# Patient Record
Sex: Male | Born: 1960 | Race: Black or African American | Hispanic: No | Marital: Married | Smoking: Former smoker
Health system: Southern US, Community
[De-identification: ages and names within clinical notes are randomized; demographics above are authoritative.]

## PROBLEM LIST (undated history)

## (undated) DIAGNOSIS — G4700 Insomnia, unspecified: Secondary | ICD-10-CM

## (undated) DIAGNOSIS — E039 Hypothyroidism, unspecified: Secondary | ICD-10-CM

## (undated) DIAGNOSIS — J439 Emphysema, unspecified: Secondary | ICD-10-CM

## (undated) DIAGNOSIS — J449 Chronic obstructive pulmonary disease, unspecified: Secondary | ICD-10-CM

## (undated) DIAGNOSIS — Z720 Tobacco use: Secondary | ICD-10-CM

## (undated) DIAGNOSIS — R51 Headache: Secondary | ICD-10-CM

## (undated) DIAGNOSIS — R0602 Shortness of breath: Secondary | ICD-10-CM

## (undated) DIAGNOSIS — J90 Pleural effusion, not elsewhere classified: Secondary | ICD-10-CM

## (undated) DIAGNOSIS — I10 Essential (primary) hypertension: Secondary | ICD-10-CM

## (undated) DIAGNOSIS — R351 Nocturia: Secondary | ICD-10-CM

## (undated) DIAGNOSIS — C3490 Malignant neoplasm of unspecified part of unspecified bronchus or lung: Secondary | ICD-10-CM

## (undated) DIAGNOSIS — K219 Gastro-esophageal reflux disease without esophagitis: Secondary | ICD-10-CM

## (undated) HISTORY — DX: Malignant neoplasm of unspecified part of unspecified bronchus or lung: C34.90

## (undated) HISTORY — PX: APPENDECTOMY: SHX54

## (undated) HISTORY — DX: Pleural effusion, not elsewhere classified: J90

## (undated) HISTORY — DX: Chronic obstructive pulmonary disease, unspecified: J44.9

---

## 2007-02-11 ENCOUNTER — Emergency Department (HOSPITAL_COMMUNITY): Admission: EM | Admit: 2007-02-11 | Discharge: 2007-02-11 | Payer: Self-pay | Admitting: Emergency Medicine

## 2011-05-27 LAB — URINALYSIS, ROUTINE W REFLEX MICROSCOPIC
Bilirubin Urine: NEGATIVE
Glucose, UA: NEGATIVE
Specific Gravity, Urine: 1.021
pH: 6

## 2011-05-27 LAB — CBC
HCT: 46.1
Hemoglobin: 15.6
MCV: 87.5
RBC: 5.27
WBC: 8

## 2011-05-27 LAB — LIPASE, BLOOD: Lipase: 14

## 2011-05-27 LAB — COMPREHENSIVE METABOLIC PANEL
Albumin: 3.6
Alkaline Phosphatase: 83
BUN: 8
CO2: 28
Chloride: 107
Creatinine, Ser: 0.77
GFR calc non Af Amer: >60
Potassium: 3.6
Total Bilirubin: 0.9

## 2011-05-27 LAB — DIFFERENTIAL
Basophils Absolute: 0
Basophils Relative: 0
Eosinophils Relative: 1
Lymphocytes Relative: 34
Monocytes Absolute: 0.5
Neutro Abs: 4.7

## 2011-05-27 LAB — URINE MICROSCOPIC-ADD ON: RBC / HPF: NONE SEEN

## 2012-12-24 ENCOUNTER — Inpatient Hospital Stay (HOSPITAL_COMMUNITY): Payer: Medicaid Other

## 2012-12-24 ENCOUNTER — Inpatient Hospital Stay (HOSPITAL_COMMUNITY)
Admission: EM | Admit: 2012-12-24 | Discharge: 2012-12-30 | DRG: 180 | Disposition: A | Discharge status: Home / Self Care | Payer: Medicaid Other | Attending: Internal Medicine | Admitting: Internal Medicine

## 2012-12-24 ENCOUNTER — Emergency Department (HOSPITAL_COMMUNITY): Payer: Medicaid Other

## 2012-12-24 ENCOUNTER — Encounter (HOSPITAL_COMMUNITY): Payer: Self-pay | Admitting: *Deleted

## 2012-12-24 DIAGNOSIS — R000 Tachycardia, unspecified: Secondary | ICD-10-CM | POA: Diagnosis present

## 2012-12-24 DIAGNOSIS — R0902 Hypoxemia: Secondary | ICD-10-CM

## 2012-12-24 DIAGNOSIS — E43 Unspecified severe protein-calorie malnutrition: Secondary | ICD-10-CM | POA: Diagnosis present

## 2012-12-24 DIAGNOSIS — J4489 Other specified chronic obstructive pulmonary disease: Secondary | ICD-10-CM | POA: Diagnosis present

## 2012-12-24 DIAGNOSIS — I10 Essential (primary) hypertension: Secondary | ICD-10-CM | POA: Diagnosis present

## 2012-12-24 DIAGNOSIS — E41 Nutritional marasmus: Secondary | ICD-10-CM | POA: Diagnosis present

## 2012-12-24 DIAGNOSIS — J90 Pleural effusion, not elsewhere classified: Secondary | ICD-10-CM | POA: Diagnosis present

## 2012-12-24 DIAGNOSIS — J449 Chronic obstructive pulmonary disease, unspecified: Secondary | ICD-10-CM | POA: Diagnosis present

## 2012-12-24 DIAGNOSIS — F17200 Nicotine dependence, unspecified, uncomplicated: Secondary | ICD-10-CM | POA: Diagnosis present

## 2012-12-24 DIAGNOSIS — IMO0002 Reserved for concepts with insufficient information to code with codable children: Secondary | ICD-10-CM

## 2012-12-24 DIAGNOSIS — R599 Enlarged lymph nodes, unspecified: Secondary | ICD-10-CM | POA: Diagnosis present

## 2012-12-24 DIAGNOSIS — Z720 Tobacco use: Secondary | ICD-10-CM | POA: Diagnosis present

## 2012-12-24 DIAGNOSIS — Z6826 Body mass index (BMI) 26.0-26.9, adult: Secondary | ICD-10-CM

## 2012-12-24 DIAGNOSIS — R634 Abnormal weight loss: Secondary | ICD-10-CM | POA: Diagnosis present

## 2012-12-24 DIAGNOSIS — J910 Malignant pleural effusion: Principal | ICD-10-CM | POA: Diagnosis present

## 2012-12-24 DIAGNOSIS — Z8042 Family history of malignant neoplasm of prostate: Secondary | ICD-10-CM

## 2012-12-24 HISTORY — DX: Tobacco use: Z72.0

## 2012-12-24 HISTORY — DX: Essential (primary) hypertension: I10

## 2012-12-24 LAB — CBC WITH DIFFERENTIAL/PLATELET
Basophils Absolute: 0 10*3/uL (ref 0.0–0.1)
Hemoglobin: 15.2 g/dL (ref 13.0–17.0)
MCH: 29 pg (ref 26.0–34.0)
MCHC: 34.2 g/dL (ref 30.0–36.0)
Neutro Abs: 4.4 10*3/uL (ref 1.7–7.7)
Neutrophils Relative %: 56 % (ref 43–77)
Platelets: 226 10*3/uL (ref 150–400)
RBC: 5.25 MIL/uL (ref 4.22–5.81)

## 2012-12-24 LAB — COMPREHENSIVE METABOLIC PANEL
BUN: 13 mg/dL (ref 6–23)
CO2: 24 mEq/L (ref 19–32)
Calcium: 9.1 mg/dL (ref 8.4–10.5)
Creatinine, Ser: 0.72 mg/dL (ref 0.50–1.35)
GFR calc Af Amer: >90 mL/min (ref >90)
GFR calc non Af Amer: >90 mL/min (ref >90)
Glucose, Bld: 96 mg/dL (ref 70–99)

## 2012-12-24 LAB — URINALYSIS, ROUTINE W REFLEX MICROSCOPIC
Leukocytes, UA: NEGATIVE
Protein, ur: NEGATIVE mg/dL
Urobilinogen, UA: 0.2 mg/dL (ref 0.0–1.0)

## 2012-12-24 LAB — URINE MICROSCOPIC-ADD ON

## 2012-12-24 LAB — PRO B NATRIURETIC PEPTIDE: Pro B Natriuretic peptide (BNP): 59.5 pg/mL (ref 0–125)

## 2012-12-24 MED ORDER — ONDANSETRON HCL 4 MG/2ML IJ SOLN
4.0000 mg | Freq: Once | INTRAMUSCULAR | Status: AC
  Administered 2012-12-24: 4 mg via INTRAVENOUS
  Filled 2012-12-24: qty 2

## 2012-12-24 MED ORDER — SODIUM CHLORIDE 0.9 % IV SOLN
INTRAVENOUS | Status: AC
  Administered 2012-12-24: 19:00:00 via INTRAVENOUS

## 2012-12-24 MED ORDER — ONDANSETRON HCL 4 MG PO TABS
4.0000 mg | ORAL_TABLET | Freq: Four times a day (QID) | ORAL | Status: DC | PRN
  Administered 2012-12-25: 4 mg via ORAL
  Filled 2012-12-24: qty 1

## 2012-12-24 MED ORDER — IOHEXOL 350 MG/ML SOLN
100.0000 mL | Freq: Once | INTRAVENOUS | Status: AC | PRN
  Administered 2012-12-24: 100 mL via INTRAVENOUS

## 2012-12-24 MED ORDER — NICOTINE 7 MG/24HR TD PT24
7.0000 mg | MEDICATED_PATCH | Freq: Every day | TRANSDERMAL | Status: DC
  Administered 2012-12-24 – 2012-12-25 (×2): 7 mg via TRANSDERMAL
  Filled 2012-12-24 (×2): qty 1

## 2012-12-24 MED ORDER — ACETAMINOPHEN 650 MG RE SUPP: 650.0000 mg | Freq: Four times a day (QID) | RECTAL | Status: DC | PRN

## 2012-12-24 MED ORDER — SODIUM CHLORIDE 0.9 % IJ SOLN
3.0000 mL | Freq: Two times a day (BID) | INTRAMUSCULAR | Status: DC
  Administered 2012-12-24 – 2012-12-26 (×5): 3 mL via INTRAVENOUS
  Administered 2012-12-26: 23:00:00 via INTRAVENOUS
  Administered 2012-12-27 – 2012-12-30 (×5): 3 mL via INTRAVENOUS

## 2012-12-24 MED ORDER — HYDROCODONE-ACETAMINOPHEN 5-325 MG PO TABS
1.0000 | ORAL_TABLET | ORAL | Status: DC | PRN
  Administered 2012-12-25 (×2): 1 via ORAL
  Administered 2012-12-27 – 2012-12-29 (×3): 2 via ORAL
  Filled 2012-12-24 (×2): qty 2
  Filled 2012-12-24 (×2): qty 1
  Filled 2012-12-24: qty 2

## 2012-12-24 MED ORDER — DEXTROSE 5 % IV SOLN
1.0000 g | Freq: Once | INTRAVENOUS | Status: AC
  Administered 2012-12-24: 1 g via INTRAVENOUS
  Filled 2012-12-24: qty 10

## 2012-12-24 MED ORDER — ACETAMINOPHEN 325 MG PO TABS
650.0000 mg | ORAL_TABLET | Freq: Four times a day (QID) | ORAL | Status: DC | PRN
  Administered 2012-12-25 – 2012-12-26 (×4): 650 mg via ORAL
  Filled 2012-12-24 (×4): qty 2

## 2012-12-24 MED ORDER — HYDROMORPHONE HCL PF 1 MG/ML IJ SOLN: 1.0000 mg | INTRAMUSCULAR | Status: DC | PRN

## 2012-12-24 MED ORDER — ONDANSETRON HCL 4 MG/2ML IJ SOLN
4.0000 mg | Freq: Four times a day (QID) | INTRAMUSCULAR | Status: DC | PRN
  Administered 2012-12-26 – 2012-12-28 (×3): 4 mg via INTRAVENOUS
  Filled 2012-12-24 (×3): qty 2

## 2012-12-24 MED ORDER — DEXTROSE 5 % IV SOLN
500.0000 mg | INTRAVENOUS | Status: DC
  Administered 2012-12-25 – 2012-12-29 (×4): 500 mg via INTRAVENOUS
  Filled 2012-12-24 (×6): qty 500

## 2012-12-24 MED ORDER — MORPHINE SULFATE 4 MG/ML IJ SOLN
4.0000 mg | Freq: Once | INTRAMUSCULAR | Status: AC
  Administered 2012-12-24: 4 mg via INTRAVENOUS
  Filled 2012-12-24: qty 1

## 2012-12-24 MED ORDER — SENNOSIDES-DOCUSATE SODIUM 8.6-50 MG PO TABS
1.0000 | ORAL_TABLET | Freq: Every evening | ORAL | Status: DC | PRN
  Filled 2012-12-24: qty 1

## 2012-12-24 MED ORDER — ONDANSETRON HCL 4 MG/2ML IJ SOLN: 4.0000 mg | Freq: Three times a day (TID) | INTRAMUSCULAR | Status: DC | PRN

## 2012-12-24 MED ORDER — DEXTROSE 5 % IV SOLN
500.0000 mg | Freq: Once | INTRAVENOUS | Status: AC
  Administered 2012-12-24: 500 mg via INTRAVENOUS
  Filled 2012-12-24: qty 500

## 2012-12-24 MED ORDER — ALBUTEROL SULFATE HFA 108 (90 BASE) MCG/ACT IN AERS
2.0000 | INHALATION_SPRAY | RESPIRATORY_TRACT | Status: DC | PRN
  Filled 2012-12-24: qty 6.7

## 2012-12-24 MED ORDER — DEXTROSE 5 % IV SOLN
1.0000 g | INTRAVENOUS | Status: DC
  Administered 2012-12-25 – 2012-12-29 (×5): 1 g via INTRAVENOUS
  Filled 2012-12-24 (×6): qty 10

## 2012-12-24 MED ORDER — TRAZODONE HCL 50 MG PO TABS
50.0000 mg | ORAL_TABLET | Freq: Every evening | ORAL | Status: DC | PRN
  Filled 2012-12-24: qty 1

## 2012-12-24 MED ORDER — GUAIFENESIN-DM 100-10 MG/5ML PO SYRP: 5.0000 mL | ORAL_SOLUTION | ORAL | Status: DC | PRN

## 2012-12-24 MED ORDER — MORPHINE SULFATE 2 MG/ML IJ SOLN: 2.0000 mg | INTRAMUSCULAR | Status: DC | PRN

## 2012-12-24 NOTE — ED Notes (Signed)
Admitting MD at bedside.

## 2012-12-24 NOTE — ED Notes (Signed)
Pt given a urinal and is aware a urine sample is needed 

## 2012-12-24 NOTE — H&P (Signed)
[facility] History and Physical  Deniel Mcquiston EAV:[id number] DOB: May 26, 1961 DOA: 12/24/2012  Referring physician: Patria Mane PCP: No PCP Per Patient   Chief Complaint: cough, shortness of breath  HPI: Geoffrey Richardson is a 52 y.o. male who presents to the emergency room with shortness of breath cough weakness and right-sided pleuritic chest pain. He started having a cough about 6 months ago. It's usually white but sometimes blood tinged. He has had subjective fevers and chills. He is extremely weak. He has become short of breath. He had been smoking a pack and a half of cigarettes per day but has cut down to just a few a day. He has lost a lot of weight recently. He never sought medical advice previously. In the emergency room, he was found to have a large pleural effusion on chest x-ray. White count was normal. He was started on antibiotics, oxygen and a CT angiogram was ordered. No known lung problems previously. His oxygen saturations reportedly were in the 80s but I do not see where this was documented. Currently in the 90s on supplemental oxygen. Apparently he was quite tachycardic as well into the 120s. CT scan shows large right pleural effusion without any definite PE or mass. Emphysema.   Review of Systems: As above otherwise negative  Past medical history: None  Past surgical history: Appendectomy  Social History: Smoker, usually 1-1/2 packs per day. Drinks occasionally. Denies drugs. Works in [employer].  No Known Allergies  Family history: Mother had ALS. Father and grandfather had prostate cancer.  Prior to Admission medications   Medication Sig Start Date End Date Taking? Authorizing Provider  ibuprofen (ADVIL,MOTRIN) 200 MG tablet Take 200 mg by mouth every 6 (six) hours as needed for pain.   Yes Historical Provider, MD   Physical Exam: Filed Vitals:   12/24/12 1620 12/24/12 1621 12/24/12 1900  BP: 142/94  147/101  Pulse: 101  86  Temp: 97.9 F (36.6 C)    TempSrc:  Oral    Resp: 13  18  Height:  6\' 2"  (1.88 m)   Weight:  95.255 kg (210 lb)   SpO2: 96%  93%   BP 151/111  Pulse 62  Temp(Src) 97.6 F (36.4 C) (Oral)  Resp 18  Ht 6\' 2"  (1.88 m)  Wt 95.255 kg (210 lb)  BMI 26.95 kg/m2  SpO2 95%  General Appearance:    Alert, cooperative, slightly anxious appearing. Appears younger than stated age   Head:    Normocephalic, without obvious abnormality, atraumatic  Eyes:    PERRL, conjunctiva/corneas clear, EOM's intact, fundi    benign, both eyes          Nose:   Nares normal, septum midline, mucosa normal, no drainage   or sinus tenderness  Throat:   Lips, mucosa, and tongue normal; teeth and gums normal  Neck:   Supple, symmetrical, trachea midline, no adenopathy;       thyroid:  No enlargement/tenderness/nodules; no carotid   bruit or JVD  Back:     Symmetric, no curvature, ROM normal, no CVA tenderness  Lungs:     bronchial breath sounds on the right. No rales. Dullness to percussion on the right. No wheezes.   Chest wall:    No tenderness or deformity  Heart:    Regular rate and rhythm, S1 and S2 normal, no murmur, rub   or gallop  Abdomen:     Soft, non-tender, bowel sounds active all four quadrants,    no masses, no  organomegaly  Genitalia:   deferred   Rectal:   deferred   Extremities:   Extremities normal, atraumatic, no cyanosis or edema  Pulses:   2+ and symmetric all extremities  Skin:   Skin color, texture, turgor normal, no rashes or lesions  Lymph nodes:   Cervical, supraclavicular, and axillary nodes normal  Neurologic:   CNII-XII intact. Normal strength, sensation and reflexes      throughout    Psychiatric: Anxious appearing  Labs on Admission:  Basic Metabolic Panel:  Recent Labs Lab 12/24/12 1632  NA 141  K 3.8  CL 107  CO2 24  GLUCOSE 96  BUN 13  CREATININE 0.72  CALCIUM 9.1   Liver Function Tests:  Recent Labs Lab 12/24/12 1632  AST 17  ALT 16  ALKPHOS 93  BILITOT 0.5  PROT 7.5  ALBUMIN 3.1*    No results found for this basename: LIPASE, AMYLASE,  in the last 168 hours No results found for this basename: AMMONIA,  in the last 168 hours CBC:  Recent Labs Lab 12/24/12 1632  WBC 7.9  NEUTROABS 4.4  HGB 15.2  HCT 44.4  MCV 84.6  PLT 226   Cardiac E Willnzymes:  Recent Labs Lab 12/24/12 1634  TROPONINI <0.30    BNP (last 3 results)  Recent Labs  12/24/12 1634  PROBNP 59.5   CBG: No results found for this basename: GLUCAP,  in the last 168 hours  Radiological Exams on Admission: Dg Chest 2 View  12/24/2012   *RADIOLOGY REPORT*  Clinical Data: Right-sided chest pain.  Short of breath.  CHEST - 2 VIEW  Comparison: None.  Findings: Heart size is probably normal.  Mediastinal shadows are normal.  The left lung is clear.  There is a large pleural effusion on the right apparently dependently positioned.  The right upper lobe shows partial aeration.  The right lower lobe and right middle lobe or collapse.  No significant bony finding.  IMPRESSION: Large right effusion with collapse of the right middle and lower lobe.  Etiology uncertain.  ,   Original Report Authenticated By: Paulina Fusi, M.D.    EKG: ST with nonspecific changes.  Assessment/Plan Principal Problem:   Pleural effusion, right: infection v. Neoplasm.  Will order US guided thoracentesis, and send for the usual studies plus cytology.  Cover with abx for CAP. Check HIV Active Problems:   Tobacco abuse with copd   Weight loss   Code Status: full Family Communication: daughter and friend Disposition Plan: home  Time spent: 60 minutes  Christiane Ha [facility] Pager [phone]  If 7PM-7AM, please contact night-coverage [email or web address] Password [facility] 12/24/2012, 7:54 PM

## 2012-12-24 NOTE — ED Notes (Signed)
Pt c/o right side CP that goes to his back and is worse with cough. Pt reports cough greater than 1 month. Has recently had sob and generalized weakness.

## 2012-12-24 NOTE — ED Provider Notes (Signed)
History     CSN: [id number]  Arrival date & time 12/24/12  1611   First MD Initiated Contact with Patient 12/24/12 1613      Chief Complaint  Patient presents with  . Cough  . Chest Pain    (Consider location/radiation/quality/duration/timing/severity/associated sxs/prior treatment) HPI Comments: Patient presents with one month history of right-sided chest pain it radiates to his back. Left side. Associated with a dry cough. He also has generalized shortness of breath is worse with exertion. Denies any fever or, chills, nausea or vomiting. He lifted a 50 pound box yesterday and had a syncopal episode with shortness of breath afterwards. Denies any leg pain or swelling. No previous cardiac or pulmonary history. He is a smoker. Is not wear oxygen at home.  The history is provided by the patient and the EMS personnel.    History reviewed. No pertinent past medical history.  History reviewed. No pertinent past surgical history.  History reviewed. No pertinent family history.  History  Substance Use Topics  . Smoking status: Current Every Day Smoker -- 2.00 packs/day for 35 years  . Smokeless tobacco: Not on file  . Alcohol Use: Not on file      Review of Systems  Constitutional: Negative for fever, activity change and appetite change.  HENT: Negative for congestion and rhinorrhea.   Respiratory: Positive for cough, chest tightness and shortness of breath.   Cardiovascular: Positive for chest pain. Negative for leg swelling.  Gastrointestinal: Negative for nausea, vomiting and abdominal pain.  Genitourinary: Negative for dysuria and hematuria.  Musculoskeletal: Negative for back pain.  Skin: Negative for rash.  Neurological: Positive for weakness and light-headedness. Negative for dizziness.  A complete 10 system review of systems was obtained and all systems are negative except as noted in the HPI and PMH.    Allergies  Review of patient's allergies indicates no known  allergies.  Home Medications   No current outpatient prescriptions on file.  BP 151/111  Pulse 62  Temp(Src) 97.6 F (36.4 C) (Oral)  Resp 18  Ht 6' (1.829 m)  Wt 193 lb 12.6 oz (87.9 kg)  BMI 26.28 kg/m2  SpO2 95%  Physical Exam  Constitutional: He is oriented to person, place, and time. He appears well-developed and well-nourished. He appears distressed.  Tachycardic and hypoxic  HENT:  Head: Normocephalic and atraumatic.  Mouth/Throat: Oropharynx is clear and moist. No oropharyngeal exudate.  Eyes: Conjunctivae and EOM are normal. Pupils are equal, round, and reactive to light.  Neck: Normal range of motion. Neck supple.  Cardiovascular: Normal rate, regular rhythm and normal heart sounds.   No murmur heard. Pulmonary/Chest: Breath sounds normal. He is in respiratory distress.  Decreased BS on R. Clear BS on L.  Abdominal: Soft. There is no tenderness. There is no rebound and no guarding.  Musculoskeletal: Normal range of motion. He exhibits no edema and no tenderness.  Neurological: He is alert and oriented to person, place, and time. No cranial nerve deficit. He exhibits normal muscle tone. Coordination normal.  Skin: Skin is warm.    ED Course  Procedures (including critical care time)  Labs Reviewed  COMPREHENSIVE METABOLIC PANEL - Abnormal; Notable for the following:    Albumin 3.1 (*)    All other components within normal limits  URINALYSIS, ROUTINE W REFLEX MICROSCOPIC - Abnormal; Notable for the following:    Hgb urine dipstick SMALL (*)    Ketones, ur 15 (*)    All other components within normal limits  BODY FLUID CULTURE  CBC WITH DIFFERENTIAL  TROPONIN I  PRO B NATRIURETIC PEPTIDE  URINE MICROSCOPIC-ADD ON  TSH  BODY FLUID CELL COUNT WITH DIFFERENTIAL  PROTEIN, BODY FLUID  LACTATE DEHYDROGENASE, BODY FLUID  GLUCOSE, SEROUS FLUID  PH, BODY FLUID  HIV ANTIBODY (ROUTINE TESTING)  CYTOLOGY - NON PAP   Dg Chest 2 View  12/24/2012   *RADIOLOGY  REPORT*  Clinical Data: Right-sided chest pain.  Short of breath.  CHEST - 2 VIEW  Comparison: None.  Findings: Heart size is probably normal.  Mediastinal shadows are normal.  The left lung is clear.  There is a large pleural effusion on the right apparently dependently positioned.  The right upper lobe shows partial aeration.  The right lower lobe and right middle lobe or collapse.  No significant bony finding.  IMPRESSION: Large right effusion with collapse of the right middle and lower lobe.  Etiology uncertain.  ,   Original Report Authenticated By: Paulina Fusi, M.D.   Ct Angio Chest Pe W/cm &/or Wo Cm  12/24/2012   *RADIOLOGY REPORT*  Clinical Data: Shortness of breath and pleural effusion  CT ANGIOGRAPHY CHEST  Technique:  Multidetector CT imaging of the chest using the standard protocol during bolus administration of intravenous contrast. Multiplanar reconstructed images including MIPs were obtained and reviewed to evaluate the vascular anatomy.  Contrast: OMNIPAQUE IOHEXOL 350 MG/ML SOLN  Comparison: Chest radiograph 12/24/2012  Findings: There is a large, unilateral simple appearing right pleural effusion.  This effusion causes slight leftward mediastinal shift.  Heart size is borderline enlarged.  Thoracic aorta is normal in caliber.  Central pulmonary arteries are normal in caliber and well opacified with contrast.  No filling defects are identified within the pulmonary arteries to suggest pulmonary embolism.  Evaluation of the pulmonary arteries on the right projects is somewhat limited by marked atelectasis of the right lung.  The upper thoracic esophagus demonstrates normal wall thickness and is mildly dilated with air.  At the level of the mid thoracic esophagus the esophageal lumen becomes narrowed and there is suggestion of circumferential wall thickening of the esophagus, measuring up to 1.4 cm on image number 52.  This apparent esophageal wall thickening could alternatively be due to  prominent mediastinal / left hilar nodal tissue.  Subcarinal lymph node measures 10 mm short axis, mildly prominent.  There is no pleural effusion on the left.  Lung windows demonstrate a background of moderate centrilobular emphysema.  There is collapse of a significant portion of the right lower lobe and a lesser degree of collapse of the right upper lobe and right middle lobe due to the large pleural effusion.  The bronchus to the right lower lobe is not aerated, and therefore an obstructing right lower lobe endobronchial lesion cannot be excluded.  There is some respiratory motion that limits the sensitivity for detecting small nodules.  No focal nodules or masses are identified in the left lung.  There is no airspace disease on the left.  Imaged portion of the upper abdomen demonstrates normal left adrenal gland.  The right adrenal gland is not included in the imaging field.  No focal lesions are seen within the visualized portion of the liver.  Thoracic spine vertebral bodies are normal in height and alignment. No suspicious osseous lesion is identified.  IMPRESSION:  1.  Large unilateral right pleural effusion.  This finding is suspicious for malignancy, and pleural fluid analysis should be considered.  There is collapse of the majority of  the right lower lobe as well as some atelectasis of the right upper lobe and right middle lobe. A primary lung malignancy on the right cannot be excluded; although a discrete mass is not seen, the bronchus to the right lower lobe is not aerated, and an endobronchial lesion, or collapse do to extrinsic mass effect must be considered. Infection as a cause of the large right pleural effusion is also a consideration, and clinical correlation would likely be held to distinguish these two possibilities.  2.  Apparent circumferential wall thickening focally of the mid thoracic esophagus with focal narrowing of the lumen, and proximal dilatation of the upper thoracic esophagus.   Findings can be secondary to esophageal neoplasm or esophagitis. Alternatively, ill- defined mediastinal/hilar lymphadenopathy could be causing this apparent soft tissue thickening in the region of the esophagus.  3.  Emphysema.  Findings discussed with Dr. Manus Gunning in the Emergency Department at 7:57 p.m. 12/24/2012.   Original Report Authenticated By: Britta Mccreedy, M.D.     1. Pleural effusion   2. Hypoxia   3. COPD (chronic obstructive pulmonary disease)   4. Pleural effusion, right   5. Tobacco abuse   6. Weight loss       MDM  One month history of right-sided chest pain with shortness of breath on exertion. Decreased breath sounds on the right on exam. Patient is tachycardic and toxic. Concern for pneumothorax.  No pneumothorax seen but the patient has a large right-sided pleural effusion with right lower lobe and middle lobe collapse. He is a nonproductive cough. He has no leukocytosis. Cannot rule out pneumonia as cause of effusion. Discussed with Dr. Dorris Fetch of thoracic surgery who agrees a medical admission and thoracentesis.  Large pleural effusion suspicious for malignancy. We'll cover her with empiric antibiotics for possible pneumonia as well. Patient will be admitted for supplemental oxygen and thoracentesis and further workup.   Date: 12/24/2012  Rate: 100  Rhythm: sinus tachycardia  QRS Axis: normal  Intervals: normal  ST/T Wave abnormalities: normal  Conduction Disutrbances:none  Narrative Interpretation:   Old EKG Reviewed: none available      Glynn Octave, MD 12/24/12 2324

## 2012-12-25 ENCOUNTER — Inpatient Hospital Stay (HOSPITAL_COMMUNITY): Payer: Medicaid Other

## 2012-12-25 DIAGNOSIS — R0902 Hypoxemia: Secondary | ICD-10-CM

## 2012-12-25 LAB — GLUCOSE, SEROUS FLUID: Glucose, Fluid: 70 mg/dL

## 2012-12-25 LAB — LACTATE DEHYDROGENASE, PLEURAL OR PERITONEAL FLUID: LD, Fluid: 693 U/L — ABNORMAL HIGH (ref 3–23)

## 2012-12-25 LAB — BODY FLUID CELL COUNT WITH DIFFERENTIAL
Lymphs, Fluid: 83 %
Monocyte-Macrophage-Serous Fluid: 4 % — ABNORMAL LOW (ref 50–90)
Neutrophil Count, Fluid: 7 % (ref 0–25)

## 2012-12-25 LAB — PROTEIN, BODY FLUID: Total protein, fluid: 4.3 g/dL

## 2012-12-25 LAB — PROTEIN, TOTAL: Total Protein: 6.7 g/dL (ref 6.0–8.3)

## 2012-12-25 LAB — TSH: TSH: 0.234 u[IU]/mL — ABNORMAL LOW (ref 0.350–4.500)

## 2012-12-25 MED ORDER — NICOTINE 14 MG/24HR TD PT24
14.0000 mg | MEDICATED_PATCH | Freq: Every day | TRANSDERMAL | Status: DC
  Administered 2012-12-25 – 2012-12-30 (×6): 14 mg via TRANSDERMAL
  Filled 2012-12-25 (×6): qty 1

## 2012-12-25 MED ORDER — BOOST / RESOURCE BREEZE PO LIQD
1.0000 | Freq: Three times a day (TID) | ORAL | Status: DC
  Administered 2012-12-25: 1 via ORAL
  Filled 2012-12-25: qty 1

## 2012-12-25 MED ORDER — PANTOPRAZOLE SODIUM 40 MG IV SOLR
40.0000 mg | Freq: Two times a day (BID) | INTRAVENOUS | Status: DC
  Administered 2012-12-25 – 2012-12-28 (×8): 40 mg via INTRAVENOUS
  Filled 2012-12-25 (×11): qty 40

## 2012-12-25 NOTE — Progress Notes (Signed)
[facility] PROGRESS NOTE  Geoffrey Richardson AVW:[id number] DOB: June 28, 1961 DOA: 12/24/2012 PCP: No PCP Per Patient  Assessment/Plan: 1. Right side pleural effusion: infectious vs malignancy. Patient s/P thoracentesis 5-17. Will follow culture and fluids study. HIV pending. I will continue with ceftriaxone and Azithromycin day 2. Cytology pending.  2. Wall thickening of the mid  thoracic esophagus with focal narrowing of the lumen, and proximal dilatation of the upper thoracic esophagus. Findings can be secondary to esophageal neoplasm or esophagitis. Alternatively, ill- defined mediastinal/hilar lymphadenopathy could be causing this apparent soft tissue thickening in the region of the esophagus. Patient denies dysphagia, odynophagia. Will start protonix. Will follow cytology of pleural fluids. Geoffrey Richardson will need at some point endoscopy.    Code Status: Full Family Communication: Care discussed with patient.  Disposition Plan: to be determine.    Consultants:  IR for thoracentesis    Procedures:  Right side thoracentesis 5-17  Antibiotics:  Azithromycin 5-16  Ceftriaxone 5-16  HPI/Subjective: Feeling better than yesterday. Geoffrey Richardson is breathing better.   Objective: Filed Vitals:   12/25/12 0849 12/25/12 0853 12/25/12 0856 12/25/12 0902  BP: 129/90 130/90 126/91 128/90  Pulse:      Temp:      TempSrc:      Resp:      Height:      Weight:      SpO2:        Intake/Output Summary (Last 24 hours) at 12/25/12 0910 Last data filed at 12/25/12 0403  Gross per 24 hour  Intake    243 ml  Output    500 ml  Net   -257 ml   Filed Weights   12/24/12 1621 12/24/12 2000  Weight: 95.255 kg (210 lb) 87.9 kg (193 lb 12.6 oz)    Exam:   General:  No distress.   Head: small mass ?  right post occipital area,   Cardiovascular: S 1, S 2 RRR  Respiratory: Crackles right side, ronchus.   Abdomen: Bs present, soft, NT  Musculoskeletal: no edema.   Data Reviewed: Basic Metabolic  Panel:  Recent Labs Lab 12/24/12 1632  NA 141  K 3.8  CL 107  CO2 24  GLUCOSE 96  BUN 13  CREATININE 0.72  CALCIUM 9.1   Liver Function Tests:  Recent Labs Lab 12/24/12 1632  AST 17  ALT 16  ALKPHOS 93  BILITOT 0.5  PROT 7.5  ALBUMIN 3.1*   No results found for this basename: LIPASE, AMYLASE,  in the last 168 hours No results found for this basename: AMMONIA,  in the last 168 hours CBC:  Recent Labs Lab 12/24/12 1632  WBC 7.9  NEUTROABS 4.4  HGB 15.2  HCT 44.4  MCV 84.6  PLT 226   Cardiac Enzymes:  Recent Labs Lab 12/24/12 1634  TROPONINI <0.30   BNP (last 3 results)  Recent Labs  12/24/12 1634  PROBNP 59.5   CBG: No results found for this basename: GLUCAP,  in the last 168 hours  No results found for this or any previous visit (from the past 240 hour(s)).   Studies: Dg Chest 2 View  12/24/2012   *RADIOLOGY REPORT*  Clinical Data: Right-sided chest pain.  Short of breath.  CHEST - 2 VIEW  Comparison: None.  Findings: Heart size is probably normal.  Mediastinal shadows are normal.  The left lung is clear.  There is a large pleural effusion on the right apparently dependently positioned.  The right upper lobe shows partial aeration.  The  right lower lobe and right middle lobe or collapse.  No significant bony finding.  IMPRESSION: Large right effusion with collapse of the right middle and lower lobe.  Etiology uncertain.  ,   Original Report Authenticated By: Paulina Fusi, M.D.   Ct Angio Chest Pe W/cm &/or Wo Cm  12/24/2012   *RADIOLOGY REPORT*  Clinical Data: Shortness of breath and pleural effusion  CT ANGIOGRAPHY CHEST  Technique:  Multidetector CT imaging of the chest using the standard protocol during bolus administration of intravenous contrast. Multiplanar reconstructed images including MIPs were obtained and reviewed to evaluate the vascular anatomy.  Contrast: OMNIPAQUE IOHEXOL 350 MG/ML SOLN  Comparison: Chest radiograph 12/24/2012   Findings: There is a large, unilateral simple appearing right pleural effusion.  This effusion causes slight leftward mediastinal shift.  Heart size is borderline enlarged.  Thoracic aorta is normal in caliber.  Central pulmonary arteries are normal in caliber and well opacified with contrast.  No filling defects are identified within the pulmonary arteries to suggest pulmonary embolism.  Evaluation of the pulmonary arteries on the right projects is somewhat limited by marked atelectasis of the right lung.  The upper thoracic esophagus demonstrates normal wall thickness and is mildly dilated with air.  At the level of the mid thoracic esophagus the esophageal lumen becomes narrowed and there is suggestion of circumferential wall thickening of the esophagus, measuring up to 1.4 cm on image number 52.  This apparent esophageal wall thickening could alternatively be due to prominent mediastinal / left hilar nodal tissue.  Subcarinal lymph node measures 10 mm short axis, mildly prominent.  There is no pleural effusion on the left.  Lung windows demonstrate a background of moderate centrilobular emphysema.  There is collapse of a significant portion of the right lower lobe and a lesser degree of collapse of the right upper lobe and right middle lobe due to the large pleural effusion.  The bronchus to the right lower lobe is not aerated, and therefore an obstructing right lower lobe endobronchial lesion cannot be excluded.  There is some respiratory motion that limits the sensitivity for detecting small nodules.  No focal nodules or masses are identified in the left lung.  There is no airspace disease on the left.  Imaged portion of the upper abdomen demonstrates normal left adrenal gland.  The right adrenal gland is not included in the imaging field.  No focal lesions are seen within the visualized portion of the liver.  Thoracic spine vertebral bodies are normal in height and alignment. No suspicious osseous lesion is  identified.  IMPRESSION:  1.  Large unilateral right pleural effusion.  This finding is suspicious for malignancy, and pleural fluid analysis should be considered.  There is collapse of the majority of the right lower lobe as well as some atelectasis of the right upper lobe and right middle lobe. A primary lung malignancy on the right cannot be excluded; although a discrete mass is not seen, the bronchus to the right lower lobe is not aerated, and an endobronchial lesion, or collapse do to extrinsic mass effect must be considered. Infection as a cause of the large right pleural effusion is also a consideration, and clinical correlation would likely be held to distinguish these two possibilities.  2.  Apparent circumferential wall thickening focally of the mid thoracic esophagus with focal narrowing of the lumen, and proximal dilatation of the upper thoracic esophagus.  Findings can be secondary to esophageal neoplasm or esophagitis. Alternatively, ill- defined mediastinal/hilar  lymphadenopathy could be causing this apparent soft tissue thickening in the region of the esophagus.  3.  Emphysema.  Findings discussed with Dr. Manus Gunning in the Emergency Department at 7:57 p.m. 12/24/2012.   Original Report Authenticated By: Britta Mccreedy, M.D.    Scheduled Meds: . azithromycin  500 mg Intravenous Q24H  . cefTRIAXone (ROCEPHIN)  IV  1 g Intravenous Q24H  . nicotine  7 mg Transdermal Daily  . sodium chloride  3 mL Intravenous Q12H   Continuous Infusions:   Principal Problem:   Pleural effusion, right Active Problems:   Tobacco abuse   Weight loss   COPD (chronic obstructive pulmonary disease)    Time spent: 35 minutes.     Jezebelle Ledwell  [facility] Pager [phone]. If 7PM-7AM, please contact night-coverage at [email or web address], password [email or web address] 12/25/2012, 9:10 AM  LOS: 1 day

## 2012-12-25 NOTE — Progress Notes (Signed)
INITIAL NUTRITION ASSESSMENT  Pt meets criteria for SEVERE MALNUTRITION in the context of chronic illness as evidenced by 18% weight loss x 6 months, severe muscle wasting and estimated intake of < 75% in > 1 month.  DOCUMENTATION CODES Per approved criteria  -Severe malnutrition in the context of chronic illness   INTERVENTION: Resource Breeze po TID, each supplement provides 250 kcal and 9 grams of protein.  NUTRITION DIAGNOSIS: Malnutrition related to very poor appetite/increased nutrient needs as evidenced by 18% weight loss x 6 months, severe muscle wasting and estimated intake of < 75% in > 1 month.  Goal: Pt to meet >/= 90% of their estimated nutrition needs.   Monitor:  PO intake, supplement acceptance, weight trend, labs  Reason for Assessment: Pt identified as at nutrition risk on the Malnutrition Screen Tool  52 y.o. male  Admitting Dx: Pleural effusion, right  ASSESSMENT: Pt with hx of cough x 6 months. CT shows large right pleural effusion, infection vs neoplasm. Thoracentesis planned. No previous lung issues.  Pt describes a loss of appetite starting 6 months ago when he started to feel sick. Pt has been making himself eat. For the last 6 months pt has ate 1-2 meals per day. If he eats 2 meals the first one is in the afternoon and usually something frozen, like pizza rolls. His second meal is cooked by family and is a typical meal consisting of a meat, veg and starch. Pt reports that he has always loved to eat. He has been weaker lately.  Pt with 18% weight loss x 6 months.   Nutrition Focused Physical Exam:  Subcutaneous Fat:  Orbital Region: WNL Upper Arm Region: WNL Thoracic and Lumbar Region: WNL  Muscle:  Temple Region: severe wasting Clavicle Bone Region: severe wasting  Clavicle and Acromion Bone Region: severe wasting Scapular Bone Region: WNL Dorsal Hand: WNL Patellar Region: mild-moderate wasting Anterior Thigh Region: severe wasting Posterior  Calf Region: WNL  Edema: not present   Height: Ht Readings from Last 1 Encounters:  12/24/12 6' (1.829 m)    Weight: Wt Readings from Last 1 Encounters:  12/24/12 193 lb 12.6 oz (87.9 kg)    Ideal Body Weight: 80.9 kg  % Ideal Body Weight: 109%  Wt Readings from Last 10 Encounters:  12/24/12 193 lb 12.6 oz (87.9 kg)    Usual Body Weight: 234 lb  % Usual Body Weight: 82%  BMI:  Body mass index is 26.28 kg/(m^2).  Estimated Nutritional Needs: Kcal: 2400-2600 Protein: 120-140 grams Fluid: > 2.4 L/day  Skin: no issues noted  Diet Order: General  EDUCATION NEEDS: -No education needs identified at this time   Intake/Output Summary (Last 24 hours) at 12/25/12 0857 Last data filed at 12/25/12 0403  Gross per 24 hour  Intake    243 ml  Output    500 ml  Net   -257 ml    Last BM: 5/16   Labs:   Recent Labs Lab 12/24/12 1632  NA 141  K 3.8  CL 107  CO2 24  BUN 13  CREATININE 0.72  CALCIUM 9.1  GLUCOSE 96    CBG (last 3)  No results found for this basename: GLUCAP,  in the last 72 hours  Scheduled Meds: . azithromycin  500 mg Intravenous Q24H  . cefTRIAXone (ROCEPHIN)  IV  1 g Intravenous Q24H  . nicotine  7 mg Transdermal Daily  . sodium chloride  3 mL Intravenous Q12H    Continuous Infusions:  History reviewed. No pertinent past medical history.  History reviewed. No pertinent past surgical history.  Kendell Bane RD, LDN, CNSC [phone] Pager [phone] After Hours Pager

## 2012-12-25 NOTE — Procedures (Signed)
Right sided effusion visualized with numerous loculations. Successful US guided right thoracentesis. Yielded 1.2L of bloody pleural fluid. Pt tolerated procedure well. No immediate complications.  Specimen was sent for labs. CXR ordered.  Brayton El PA-C 12/25/2012 9:04 AM

## 2012-12-26 ENCOUNTER — Inpatient Hospital Stay (HOSPITAL_COMMUNITY): Payer: Medicaid Other

## 2012-12-26 LAB — CBC
HCT: 45.6 % (ref 39.0–52.0)
Hemoglobin: 15.1 g/dL (ref 13.0–17.0)
MCV: 85.6 fL (ref 78.0–100.0)
RBC: 5.33 MIL/uL (ref 4.22–5.81)
WBC: 7.1 10*3/uL (ref 4.0–10.5)

## 2012-12-26 LAB — BASIC METABOLIC PANEL
Calcium: 8.7 mg/dL (ref 8.4–10.5)
Chloride: 104 mEq/L (ref 96–112)
GFR calc non Af Amer: >90 mL/min (ref >90)
Potassium: 4.2 mEq/L (ref 3.5–5.1)

## 2012-12-26 MED ORDER — AMLODIPINE BESYLATE 2.5 MG PO TABS
2.5000 mg | ORAL_TABLET | Freq: Every day | ORAL | Status: DC
  Administered 2012-12-26 – 2012-12-27 (×2): 2.5 mg via ORAL
  Filled 2012-12-26 (×2): qty 1

## 2012-12-26 MED ORDER — HYDRALAZINE HCL 20 MG/ML IJ SOLN
5.0000 mg | Freq: Once | INTRAMUSCULAR | Status: AC
  Administered 2012-12-26: 5 mg via INTRAVENOUS
  Filled 2012-12-26: qty 1

## 2012-12-26 MED ORDER — ENSURE COMPLETE PO LIQD
237.0000 mL | Freq: Three times a day (TID) | ORAL | Status: DC
  Administered 2012-12-26 – 2012-12-30 (×7): 237 mL via ORAL

## 2012-12-26 NOTE — Progress Notes (Addendum)
[facility] PROGRESS NOTE  Geoffrey Richardson ZOX:[id number] DOB: 09/29/1960 DOA: 12/24/2012 PCP: No PCP Per Patient  Assessment/Plan: 1-Right side pleural effusion: infectious vs malignancy. Patient s/P thoracentesis 5-17. culture result pending. Pleural fluid consistent with exudative pleural effusion, LDH ratio more than 2, protein 0.5.  HIV negative.  continue with ceftriaxone and Azithromycin day 3. Cytology pending. Depending on cytology result will consult pulmonary.  2-Wall thickening of the mid  thoracic esophagus:  Findings can be secondary to esophageal neoplasm or esophagitis. Alternatively, ill- defined mediastinal/hilar lymphadenopathy could be causing this apparent soft tissue thickening in the region of the esophagus.  -Ptient denies dysphagia, odynophagia. Continue with  protonix. Will follow Ctology of pleural fluids. He will need at some point endoscopy.       3-Headaches: Will order MRI.  4.Weight loss: concern for malignancy. Continue with ensure. Nutrition Cnsult. TSH low at 0.234. Will order Free T 3 and free T4 5-HTN: will start Norvasc.   Code Status: Full Family Communication: Care discussed with patient.  Disposition Plan: to be determine.    Consultants:  IR for thoracentesis    Procedures:  Right side thoracentesis 5-17  Antibiotics:  Azithromycin 5-16  Ceftriaxone 5-16  HPI/Subjective: No worsening SOB. Still with decrease appetite. He wants to get better. He is with better spirit today.   Objective: Filed Vitals:   12/25/12 1956 12/26/12 0422 12/26/12 1036 12/26/12 1421  BP: 142/111 148/112 158/106 150/94  Pulse: 100 92  95  Temp: 98.4 F (36.9 C) 98.9 F (37.2 C)  98.4 F (36.9 C)  TempSrc: Oral Oral  Oral  Resp: 18 18  18   Height:      Weight:      SpO2: 94% 92%  92%    Intake/Output Summary (Last 24 hours) at 12/26/12 1618 Last data filed at 12/26/12 1300  Gross per 24 hour  Intake    960 ml  Output      0 ml  Net    960 ml    Filed Weights   12/24/12 1621 12/24/12 2000  Weight: 95.255 kg (210 lb) 87.9 kg (193 lb 12.6 oz)    Exam:   General:  No distress.   Head: small mass ?  right post occipital area,   Cardiovascular: S 1, S 2 RRR  Respiratory: Crackles right side, ronchus.   Abdomen: Bs present, soft, NT  Musculoskeletal: no edema.   Data Reviewed: Basic Metabolic Panel:  Recent Labs Lab 12/24/12 1632 12/26/12 0525  NA 141 138  K 3.8 4.2  CL 107 104  CO2 24 26  GLUCOSE 96 104*  BUN 13 10  CREATININE 0.72 0.70  CALCIUM 9.1 8.7   Liver Function Tests:  Recent Labs Lab 12/24/12 1632 12/25/12 1431  AST 17  --   ALT 16  --   ALKPHOS 93  --   BILITOT 0.5  --   PROT 7.5 6.7  ALBUMIN 3.1*  --    No results found for this basename: LIPASE, AMYLASE,  in the last 168 hours No results found for this basename: AMMONIA,  in the last 168 hours CBC:  Recent Labs Lab 12/24/12 1632 12/26/12 0525  WBC 7.9 7.1  NEUTROABS 4.4  --   HGB 15.2 15.1  HCT 44.4 45.6  MCV 84.6 85.6  PLT 226 244   Cardiac Enzymes:  Recent Labs Lab 12/24/12 1634  TROPONINI <0.30   BNP (last 3 results)  Recent Labs  12/24/12 1634  PROBNP 59.5  CBG: No results found for this basename: GLUCAP,  in the last 168 hours  Recent Results (from the past 240 hour(s))  BODY FLUID CULTURE     Status: None   Collection Time    12/25/12  8:51 AM      Result Value Range Status   Specimen Description FLUID RIGHT PLEURAL   Final   Special Requests NONE   Final   Gram Stain     Final   Value: FEW WBC PRESENT, PREDOMINANTLY PMN     NO ORGANISMS SEEN   Culture PENDING   Incomplete   Report Status PENDING   Incomplete     Studies: Dg Chest 1 View  12/25/2012   *RADIOLOGY REPORT*  Clinical Data: Status post right thoracentesis.  CHEST - 1 VIEW  Comparison: 12/24/2012  Findings: Midline trachea.  Normal heart size.  A moderate right pleural effusion is minimally decreased in size.  No left pleural  effusion. No pneumothorax.  Clear left lung.  Persistent right base air space disease.  IMPRESSION: No pneumothorax.  Slight decrease in right-sided pleural effusion with persistent adjacent airspace disease.   Original Report Authenticated By: Jeronimo Greaves, M.D.   Dg Chest 2 View  12/24/2012   *RADIOLOGY REPORT*  Clinical Data: Right-sided chest pain.  Short of breath.  CHEST - 2 VIEW  Comparison: None.  Findings: Heart size is probably normal.  Mediastinal shadows are normal.  The left lung is clear.  There is a large pleural effusion on the right apparently dependently positioned.  The right upper lobe shows partial aeration.  The right lower lobe and right middle lobe or collapse.  No significant bony finding.  IMPRESSION: Large right effusion with collapse of the right middle and lower lobe.  Etiology uncertain.  ,   Original Report Authenticated By: Paulina Fusi, M.D.   Ct Angio Chest Pe W/cm &/or Wo Cm  12/24/2012   *RADIOLOGY REPORT*  Clinical Data: Shortness of breath and pleural effusion  CT ANGIOGRAPHY CHEST  Technique:  Multidetector CT imaging of the chest using the standard protocol during bolus administration of intravenous contrast. Multiplanar reconstructed images including MIPs were obtained and reviewed to evaluate the vascular anatomy.  Contrast: OMNIPAQUE IOHEXOL 350 MG/ML SOLN  Comparison: Chest radiograph 12/24/2012  Findings: There is a large, unilateral simple appearing right pleural effusion.  This effusion causes slight leftward mediastinal shift.  Heart size is borderline enlarged.  Thoracic aorta is normal in caliber.  Central pulmonary arteries are normal in caliber and well opacified with contrast.  No filling defects are identified within the pulmonary arteries to suggest pulmonary embolism.  Evaluation of the pulmonary arteries on the right projects is somewhat limited by marked atelectasis of the right lung.  The upper thoracic esophagus demonstrates normal wall thickness  and is mildly dilated with air.  At the level of the mid thoracic esophagus the esophageal lumen becomes narrowed and there is suggestion of circumferential wall thickening of the esophagus, measuring up to 1.4 cm on image number 52.  This apparent esophageal wall thickening could alternatively be due to prominent mediastinal / left hilar nodal tissue.  Subcarinal lymph node measures 10 mm short axis, mildly prominent.  There is no pleural effusion on the left.  Lung windows demonstrate a background of moderate centrilobular emphysema.  There is collapse of a significant portion of the right lower lobe and a lesser degree of collapse of the right upper lobe and right middle lobe due to the large  pleural effusion.  The bronchus to the right lower lobe is not aerated, and therefore an obstructing right lower lobe endobronchial lesion cannot be excluded.  There is some respiratory motion that limits the sensitivity for detecting small nodules.  No focal nodules or masses are identified in the left lung.  There is no airspace disease on the left.  Imaged portion of the upper abdomen demonstrates normal left adrenal gland.  The right adrenal gland is not included in the imaging field.  No focal lesions are seen within the visualized portion of the liver.  Thoracic spine vertebral bodies are normal in height and alignment. No suspicious osseous lesion is identified.  IMPRESSION:  1.  Large unilateral right pleural effusion.  This finding is suspicious for malignancy, and pleural fluid analysis should be considered.  There is collapse of the majority of the right lower lobe as well as some atelectasis of the right upper lobe and right middle lobe. A primary lung malignancy on the right cannot be excluded; although a discrete mass is not seen, the bronchus to the right lower lobe is not aerated, and an endobronchial lesion, or collapse do to extrinsic mass effect must be considered. Infection as a cause of the large right  pleural effusion is also a consideration, and clinical correlation would likely be held to distinguish these two possibilities.  2.  Apparent circumferential wall thickening focally of the mid thoracic esophagus with focal narrowing of the lumen, and proximal dilatation of the upper thoracic esophagus.  Findings can be secondary to esophageal neoplasm or esophagitis. Alternatively, ill- defined mediastinal/hilar lymphadenopathy could be causing this apparent soft tissue thickening in the region of the esophagus.  3.  Emphysema.  Findings discussed with Dr. Manus Gunning in the Emergency Department at 7:57 p.m. 12/24/2012.   Original Report Authenticated By: Britta Mccreedy, M.D.   Mr Brain Wo Contrast  12/26/2012   *RADIOLOGY REPORT*  Clinical Data: Headache. Weight loss. Tobacco use.  Large right pleural effusion concerning for malignancy.  MRI HEAD WITHOUT CONTRAST  Technique:  Multiplanar, multiecho pulse sequences of the brain and surrounding structures were obtained according to standard protocol without intravenous contrast.  Comparison: None.  Findings: There is no evidence for acute infarction, intracranial hemorrhage, mass lesion, hydrocephalus, or extra-axial fluid. There is no significant cerebral or cerebellar atrophy.  There are subcentimeter foci of increased signal in the subcortical greater than periventricular white matter, primarily involving the supratentorial region, suggesting chronic microvascular ischemic change. Flow voids are maintained in the major intracranial vessels.  No worrisome osseous lesions.  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  Negative orbits. No mastoid disease.  Mild left ethmoid fluid.  Chronic left maxillary sinus disease with air fluid level suggesting acuity. Partial empty sella.  IMPRESSION: No acute stroke or hemorrhage.  No visualized intracranial masses although detection of metastatic disease is more sensitive following administration  of IV Gadolinium.  Mild chronic microvascular ischemic change.  Incidental right suboccipital lipoma.  Probable chronic and acute left maxillary sinus disease.   Original Report Authenticated By: Davonna Belling, M.D.   US Thoracentesis Asp Pleural Space W/img Guide  12/25/2012   *RADIOLOGY REPORT*  Clinical Data:  Right-sided pleural effusion, shortness of breath, a request for diagnostic and therapeutic thoracentesis  ULTRASOUND GUIDED right THORACENTESIS  Comparison:  CT chest from 05/16  An ultrasound guided thoracentesis was thoroughly discussed with the patient and questions answered.  The benefits, risks, alternatives and complications were also  discussed.  The patient understands and wishes to proceed with the procedure.  Written consent was obtained.  Ultrasound of the right chest demonstrates large effusion with numerous loculations. Fluid density appeared complex as well. Ultrasound was then used to localize and mark an adequate pocket of fluid in the right chest.  The area was then prepped and draped in the normal sterile fashion.  1% Lidocaine was used for local anesthesia.  Under ultrasound guidance a 19 gauge Yueh catheter was introduced.  Thoracentesis was performed.  The catheter was removed and a dressing applied.  Complications:  None immediate  Findings: A total of approximately 1.2 liters of bloody pleural fluid was removed. A fluid sample was sent for laboratory analysis.  IMPRESSION: Large right pleural effusion with numerous loculations. Successful ultrasound guided right thoracentesis yielding 1.2 liters of pleural fluid.  Read by Brayton El PA-C   Original Report Authenticated By: Jolaine Click, M.D.    Scheduled Meds: . amLODipine  2.5 mg Oral Daily  . azithromycin  500 mg Intravenous Q24H  . cefTRIAXone (ROCEPHIN)  IV  1 g Intravenous Q24H  . feeding supplement  237 mL Oral TID WC  . nicotine  14 mg Transdermal Daily  . pantoprazole (PROTONIX) IV  40 mg Intravenous Q12H  .  sodium chloride  3 mL Intravenous Q12H   Continuous Infusions:   Principal Problem:   Pleural effusion, right Active Problems:   Tobacco abuse   Weight loss   COPD (chronic obstructive pulmonary disease)    Time spent: 25 minutes.     REGALADO,BELKYS  [facility] Pager [phone]. If 7PM-7AM, please contact night-coverage at [email or web address], password [facility] 12/26/2012, 4:18 PM  LOS: 2 days

## 2012-12-27 DIAGNOSIS — E43 Unspecified severe protein-calorie malnutrition: Secondary | ICD-10-CM | POA: Diagnosis present

## 2012-12-27 LAB — T4, FREE: Free T4: 0.83 ng/dL (ref 0.80–1.80)

## 2012-12-27 LAB — T3, FREE: T3, Free: 2.5 pg/mL (ref 2.3–4.2)

## 2012-12-27 MED ORDER — AMLODIPINE BESYLATE 5 MG PO TABS
5.0000 mg | ORAL_TABLET | Freq: Every day | ORAL | Status: DC
  Administered 2012-12-28 – 2012-12-30 (×3): 5 mg via ORAL
  Filled 2012-12-27 (×3): qty 1

## 2012-12-27 NOTE — Progress Notes (Signed)
[facility] PROGRESS NOTE  Geoffrey Richardson ZOX:[id number] DOB: 11-12-1960 DOA: 12/24/2012 PCP: No PCP Per Patient  Assessment/Plan: 1-Right side pleural effusion: infectious vs malignancy. Patient s/P thoracentesis 5-17. Culture pleural fluid no growth to date. Pleural fluid consistent with exudative pleural effusion, LDH ratio more than 2, protein 0.5.  HIV negative.  continue with ceftriaxone and Azithromycin day 4. Cytology pending. Depending on cytology result will consult pulmonary.  2-Wall thickening of the mid  thoracic esophagus:  Findings can be secondary to esophageal neoplasm or esophagitis. Alternatively, ill- defined mediastinal/hilar lymphadenopathy could be causing this apparent soft tissue thickening in the region of the esophagus.  -Ptient denies dysphagia, odynophagia. Continue with  protonix. Will follow Ctology of pleural fluids. He will need at some point endoscopy.       3-Headaches: MRI negative.  4.Severe Malnutrition : concern for malignancy. Continue with ensure. Nutrition Cnsulted.  5-HTN: will increase Norvasc.  6-Sick euthyroid: TSH low at 0.234.  Free T 3 2.5 and free T-4 0.83 normal. Needs repeat TSH in 4 to 6 weeks.  7-Incidental right suboccipital lipoma: Follow up out patient.    Code Status: Full Family Communication: Care discussed with patient.  Disposition Plan: to be determine.    Consultants:  IR for thoracentesis    Procedures:  Right side thoracentesis 5-17  Antibiotics:  Azithromycin 5-16  Ceftriaxone 5-16  HPI/Subjective: He had and episode of chest pain when bending forward. No others concern.   Objective: Filed Vitals:   12/26/12 1421 12/26/12 2015 12/27/12 0641 12/27/12 1249  BP: 150/94 130/85 142/107 150/109  Pulse: 95 101 97 102  Temp: 98.4 F (36.9 C) 98.3 F (36.8 C) 98.6 F (37 C) 98.2 F (36.8 C)  TempSrc: Oral Oral Oral Oral  Resp: 18 18 18 18   Height:      Weight:      SpO2: 92% 92% 92% 92%     Intake/Output Summary (Last 24 hours) at 12/27/12 1347 Last data filed at 12/27/12 1337  Gross per 24 hour  Intake    730 ml  Output      0 ml  Net    730 ml   Filed Weights   12/24/12 1621 12/24/12 2000  Weight: 95.255 kg (210 lb) 87.9 kg (193 lb 12.6 oz)    Exam:   General:  No distress.   Head: small mass ?  right post occipital area,   Cardiovascular: S 1, S 2 RRR  Respiratory: Crackles right side, ronchus.   Abdomen: Bs present, soft, NT  Musculoskeletal: no edema.   Data Reviewed: Basic Metabolic Panel:  Recent Labs Lab 12/24/12 1632 12/26/12 0525  NA 141 138  K 3.8 4.2  CL 107 104  CO2 24 26  GLUCOSE 96 104*  BUN 13 10  CREATININE 0.72 0.70  CALCIUM 9.1 8.7   Liver Function Tests:  Recent Labs Lab 12/24/12 1632 12/25/12 1431  AST 17  --   ALT 16  --   ALKPHOS 93  --   BILITOT 0.5  --   PROT 7.5 6.7  ALBUMIN 3.1*  --    No results found for this basename: LIPASE, AMYLASE,  in the last 168 hours No results found for this basename: AMMONIA,  in the last 168 hours CBC:  Recent Labs Lab 12/24/12 1632 12/26/12 0525  WBC 7.9 7.1  NEUTROABS 4.4  --   HGB 15.2 15.1  HCT 44.4 45.6  MCV 84.6 85.6  PLT 226 244   Cardiac Enzymes:  Recent Labs Lab 12/24/12 1634  TROPONINI <0.30   BNP (last 3 results)  Recent Labs  12/24/12 1634  PROBNP 59.5   CBG: No results found for this basename: GLUCAP,  in the last 168 hours  Recent Results (from the past 240 hour(s))  BODY FLUID CULTURE     Status: None   Collection Time    12/25/12  8:51 AM      Result Value Range Status   Specimen Description FLUID RIGHT PLEURAL   Final   Special Requests NONE   Final   Gram Stain     Final   Value: FEW WBC PRESENT, PREDOMINANTLY PMN     NO ORGANISMS SEEN   Culture NO GROWTH 2 DAYS   Final   Report Status PENDING   Incomplete     Studies: Mr Brain Wo Contrast  12/26/2012   *RADIOLOGY REPORT*  Clinical Data: Headache. Weight loss. Tobacco  use.  Large right pleural effusion concerning for malignancy.  MRI HEAD WITHOUT CONTRAST  Technique:  Multiplanar, multiecho pulse sequences of the brain and surrounding structures were obtained according to standard protocol without intravenous contrast.  Comparison: None.  Findings: There is no evidence for acute infarction, intracranial hemorrhage, mass lesion, hydrocephalus, or extra-axial fluid. There is no significant cerebral or cerebellar atrophy.  There are subcentimeter foci of increased signal in the subcortical greater than periventricular white matter, primarily involving the supratentorial region, suggesting chronic microvascular ischemic change. Flow voids are maintained in the major intracranial vessels.  No worrisome osseous lesions.  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  Negative orbits. No mastoid disease.  Mild left ethmoid fluid.  Chronic left maxillary sinus disease with air fluid level suggesting acuity. Partial empty sella.  IMPRESSION: No acute stroke or hemorrhage.  No visualized intracranial masses although detection of metastatic disease is more sensitive following administration of IV Gadolinium.  Mild chronic microvascular ischemic change.  Incidental right suboccipital lipoma.  Probable chronic and acute left maxillary sinus disease.   Original Report Authenticated By: Davonna Belling, M.D.    Scheduled Meds: . amLODipine  2.5 mg Oral Daily  . azithromycin  500 mg Intravenous Q24H  . cefTRIAXone (ROCEPHIN)  IV  1 g Intravenous Q24H  . feeding supplement  237 mL Oral TID WC  . nicotine  14 mg Transdermal Daily  . pantoprazole (PROTONIX) IV  40 mg Intravenous Q12H  . sodium chloride  3 mL Intravenous Q12H   Continuous Infusions:   Principal Problem:   Pleural effusion, right Active Problems:   Tobacco abuse   Weight loss   COPD (chronic obstructive pulmonary disease)    Time spent: 25 minutes.     Geoffrey Richardson  [facility] Pager [phone]. If 7PM-7AM, please contact night-coverage at [email or web address], password [facility] 12/27/2012, 1:47 PM  LOS: 3 days

## 2012-12-27 NOTE — Progress Notes (Signed)
NUTRITION CONSULT/FOLLOW UP  DOCUMENTATION CODES Per approved criteria  -Severe malnutrition in the context of chronic illness   Intervention:    Continue Ensure Complete 3 times daily (350 kcals, 13 gm protein per 8 fl oz bottle) RD to follow for nutrition care plan  Nutrition Dx:   Malnutrition related to very poor appetite/increased nutrient needs as evidenced by 18% weight loss x 6 months, severe muscle wasting and estimated intake of < 75% in > 1 month, ongoing  Goal:   Oral intake with meals & supplements to meet >/= 90% of estimated nutrition needs, progressing  Monitor:   PO & supplemental intake, weight, labs, I/O's  Assessment:   Initial nutrition assessment completed 5/17.  RD spoke with patient's significant other at bedside.  States patient's appetite getting better.  PO intake 100% per flowsheet records.  Resource Breeze ordered per RD, however patient likes Strawberry Ensure better.  Orders clarified per MD.  Height: Ht Readings from Last 1 Encounters:  12/24/12 6' (1.829 m)    Weight Status:   Wt Readings from Last 1 Encounters:  12/24/12 193 lb 12.6 oz (87.9 kg)    Re-estimated needs:  Kcal: 2200-2400 Protein: 110-120 gm Fluid: 2.2-2.4 L  Skin: Intact  Diet Order: General   Intake/Output Summary (Last 24 hours) at 12/27/12 1038 Last data filed at 12/27/12 0837  Gross per 24 hour  Intake    730 ml  Output      0 ml  Net    730 ml    Labs:   Recent Labs Lab 12/24/12 1632 12/26/12 0525  NA 141 138  K 3.8 4.2  CL 107 104  CO2 24 26  BUN 13 10  CREATININE 0.72 0.70  CALCIUM 9.1 8.7  GLUCOSE 96 104*    Scheduled Meds: . amLODipine  2.5 mg Oral Daily  . azithromycin  500 mg Intravenous Q24H  . cefTRIAXone (ROCEPHIN)  IV  1 g Intravenous Q24H  . feeding supplement  237 mL Oral TID WC  . nicotine  14 mg Transdermal Daily  . pantoprazole (PROTONIX) IV  40 mg Intravenous Q12H  . sodium chloride  3 mL Intravenous Q12H    Continuous  Infusions:   Maureen Chatters, RD, LDN Pager #: [phone] After-Hours Pager #: [phone]

## 2012-12-27 NOTE — Progress Notes (Signed)
Utilization Review Completed.Neria Procter T5/19/2014  

## 2012-12-28 ENCOUNTER — Encounter (HOSPITAL_COMMUNITY): Payer: Self-pay | Admitting: Pulmonary Disease

## 2012-12-28 DIAGNOSIS — J90 Pleural effusion, not elsewhere classified: Secondary | ICD-10-CM

## 2012-12-28 DIAGNOSIS — F17200 Nicotine dependence, unspecified, uncomplicated: Secondary | ICD-10-CM

## 2012-12-28 LAB — BODY FLUID CULTURE

## 2012-12-28 NOTE — Consult Note (Signed)
PULMONARY  / CRITICAL CARE MEDICINE  Name: Geoffrey Richardson MRN: [id number] DOB: 12-08-1960    ADMISSION DATE:  12/24/2012 CONSULTATION DATE:  12/28/12  REFERRING MD :  Dr. Sunnie Nielsen PRIMARY SERVICE:  TRH  CHIEF COMPLAINT:  Pleural Effusion  BRIEF PATIENT DESCRIPTION: 52 y/o M admitted on 5/16 with cough, SOB, weakness & R pleuritic chest pain. Found to have R exudative pleural effusion with atypical cells on cytology concerning for non-small cell carcinoma.  PCCM consulted for further work up / evaluation.     SIGNIFICANT EVENTS / STUDIES:  5/16 - Admit with cough, SOB, weakness & R pleuritic chest pain.   5/16 - CTA Chest>>>lg R pleural effusion, collapse of RLL, no discrete mass noted, circumferential wall thickening of mid-thoracic esophagus with focal narrowing of lumen, ill definined mediastinal / hilar adenopathy, emphysema 5/17 - R Thora with 1.2 L of bloody pleural fluid removed, exudative effusion based on Lights (Protein ratio 0.64, LDH ratio 2.47)  LINES / TUBES:   CULTURES: 5/17 Pleural Fluid>>>neg 5/17 Cytology>>>atypical cells suspicious for malignancy.  Cytology slides show highly atypical cells which are suspicious for non-small cell carcinoma.  Immunohistochemical stains are performed. The cells are positive for MOC-31, CEA, cytokeratin 5/6 while negative for WT-1, TTF-1 and calretnin.  Although the findings likely represent non-small cell carcinoma, the positive cytokeratin 5/6 staining coupled with the morphology warrants the above diagnosis.  ANTIBIOTICS: Rocephin 5/16>>> Azithro 5/1/6>>>  HISTORY OF PRESENT ILLNESS:  52 y/o M, smoker, admitted on 5/16 with cough, SOB, weakness & R pleuritic chest pain. Patient reports approximately one year of unintentional weight loss of >40lbs, decreased appetite over last 6 months, fatigue / weakness with inability to tolerate usual daily activities.  He notes he continues to work as Conservation officer, nature at [employer] and has not been  able to perform usual work activities due to shortness of breath and cough.  Cough has been ongoing for one year with white filmy sputum production.  He uses the city bus for transportation and has not been able to tolerate walking to bus stop or corner store without significant shortness of breath and stopping for rest / recovery.  He could perform these tasks one year ago without difficulty.  He relays multiple stories of SOB with activity including showering / performing daily routine.  Has smoked since age 76 and is up to 2 ppd.  Endorses bloating / gas with belching and decreased frequency of stool.  Uses Rolaids without relief.  Denies blood in stool or changes in consistency.  Has not had a colonoscopy.  No Primary MD, no daily medications.  Family hx of unclear "prostate problems".  Reports upper posterior back pain that he describes as like a sharp knife sticking in my back.    Hospital work up demonstrated chest xray consistent with R pleural effusion and he underwent a R thoracentesis on 5/17 with 1.2 ml bloody fluid removed.  Fluid analysis consistent with exudative pleural effusion and atypical cells on cytology concerning for non-small cell carcinoma.  HIV negative.  Noted esophageal thickening on CTA Chest, mediastinal & hilar adenopathy.  MRI of head performed for headaches and negative for metastatic disease.  PCCM consulted for further evaluation.    PAST MEDICAL HISTORY :  Past Medical History  Diagnosis Date  . Tobacco abuse     Began age 52   History reviewed. No pertinent past surgical history. Prior to Admission medications   Medication Sig Start Date End Date Taking? Authorizing Provider  ibuprofen (ADVIL,MOTRIN) 200 MG tablet Take 200 mg by mouth every 6 (six) hours as needed for pain.   Yes Historical Provider, MD   No Known Allergies  FAMILY HISTORY:  Family History  Problem Relation Age of Onset  . ALS Mother   . Other Father     Deceased from [facility], hit by drunk  driver.  Hx of prostate issues   SOCIAL HISTORY:  reports that he has been smoking Cigarettes.  He has a 72 pack-year smoking history. He has never used smokeless tobacco. His alcohol and drug histories are not on file.  REVIEW OF SYSTEMS:   Constitutional: Negative for fever, chills, and diaphoresis.  Endorses weight loss, malaise/fatigue HENT: Negative for hearing loss, ear pain, nosebleeds, congestion, sore throat, neck pain, tinnitus and ear discharge.   Eyes: Negative for blurred vision, double vision, photophobia, pain, discharge and redness.  Respiratory: Negative for wheezing and stridor.  See HPI.  Cardiovascular: Negative for chest pain, palpitations, orthopnea, claudication, leg swelling and PND.  Gastrointestinal: Negative for heartburn, nausea, vomiting, abdominal pain, diarrhea, constipation, blood in stool and melena.  Genitourinary: Negative for dysuria, urgency, frequency, hematuria and flank pain.  Musculoskeletal: Negative for myalgias, back pain, joint pain and falls.  Skin: Negative for itching and rash.  Neurological: Negative for dizziness, tingling, tremors, sensory change, speech change, focal weakness, seizures, loss of consciousness, weakness and headaches.  Endo/Heme/Allergies: Negative for environmental allergies and polydipsia. Does not bruise/bleed easily.  SUBJECTIVE: Pt distressed, concerned regarding potential cancer diagnosis  VITAL SIGNS: Temp:  [97.8 F (36.6 C)-98.7 F (37.1 C)] 97.8 F (36.6 C) (05/20 1335) Pulse Rate:  [91-104] 95 (05/20 1335) Resp:  [18-19] 19 (05/20 1335) BP: (123-138)/(84-100) 123/84 mmHg (05/20 1335) SpO2:  [90 %-98 %] 98 % (05/20 1335) FiO2 (%):  [2 %] 2 % (05/20 1335)  PHYSICAL EXAMINATION: General:  wdwn adult male in NAD Neuro:  AAOx4, speech clear, MAE HEENT:  Mm pink /moist, no JVD Cardiovascular:  s1s2 rrr, no m/r/g Lungs:  resp's even/non-labored on RA, left lung essentially clear, R side significantly  diminished.  Abdomen:  Round/soft, bsx4 active Musculoskeletal:  No acute deformities Skin:  Warm/dry, no edema   Recent Labs Lab 12/24/12 1632 12/26/12 0525  NA 141 138  K 3.8 4.2  CL 107 104  CO2 24 26  BUN 13 10  CREATININE 0.72 0.70  GLUCOSE 96 104*    Recent Labs Lab 12/24/12 1632 12/26/12 0525  HGB 15.2 15.1  HCT 44.4 45.6  WBC 7.9 7.1  PLT 226 244    ASSESSMENT / PLAN:  Dyspnea  Cough Weight Loss Right Pleural Effusion - Large right sided pleural effusion s/p thoracentesis on 5/17 with 1.2 L of bloody fluid removed.  Fluid analysis consistent with exudative effusion.  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  MRI of head negative for mets.    Plan: -I feel this is diagnostic, but he may benefit from repeat thora for further staining, especially regarding special stains to delineate adeno vs squamous cell -if we do drain pleural space dry then would repeat his CT scan to assess for a predominant mass -repeat CXR in am to evaluate rate of fluid accumulation, patient may need pleurX cath for dyspnea relief if it returns quickly -consider outpatient PET scan for evaluation of mets -consider CT ABD / PELVIS to eval for lesions / mets; already has negative brain MRI -will need input from  Oncology for further treatment, will ask them to see him on 5/21  Canary Brim, NP-C [facility] Pgr: [phone] or [phone]   12/28/2012, 4:01 PM  Levy Pupa, MD, PhD 12/28/2012, 4:46 PM [facility] [phone] or if no answer [phone]

## 2012-12-28 NOTE — Consult Note (Signed)
Reason for Referral: Malignant pleural effusion.   HPI: 52 y.o. Geoffrey Richardson native of [address] and have been living in [place] for the last 10 years. He has history of heavy smoking and have not seen a doctor for 20 years. He presented to the emergency room on 5/16 with shortness of breath cough weakness and right-sided pleuritic chest pain. He started having a cough about 6 months ago. It's usually white but sometimes blood tinged. He has had subjective fevers and chills. He is extremely weak. He has become short of breath. He had been smoking a pack and a half of cigarettes per day but has cut down to just a few a day. He has lost a lot of weight recently.In the emergency room, he was found to have a large pleural effusion on chest x-ray. White count was normal. He was started on antibiotics, oxygen and a CT angiogram was ordered.   CT angio showed Large unilateral right pleural effusion. This finding is suspicious for malignancy. Also it showed a circumferential wall thickening focally of the mid thoracic esophagus with focal narrowing of the lumen, and proximal dilatation of the upper thoracic esophagus. Findings can be secondary to esophageal neoplasm or esophagitis.   Patient under went US guided thoracentesis that yielded 1.2 L. The cytology show highly atypical cells which are suspictious for non-small cell carcinoma. Immunohistochemical stains are performed. The cells are positive for MOC-31, CEA, cytokeratin 5/6 while negative for WT-1, TTF-1 and calretnin.  Patient clinically is improving but still weak and report some DOE and right sided chest pain and back pain.      Past Medical History  Diagnosis Date  . Tobacco abuse     Began age 43  :  History reviewed. No pertinent past surgical history.:   Current Facility-Administered Medications  Medication Dose Route Frequency Provider Last Rate Last Dose  . acetaminophen (TYLENOL) tablet 650 mg  650 mg Oral Q6H PRN Christiane Ha, MD   650 mg at 12/26/12 1531   Or  . acetaminophen (TYLENOL) suppository 650 mg  650 mg Rectal Q6H PRN Christiane Ha, MD      . albuterol (PROVENTIL HFA;VENTOLIN HFA) 108 (90 BASE) MCG/ACT inhaler 2 puff  2 puff Inhalation Q4H PRN Christiane Ha, MD      . amLODipine (NORVASC) tablet 5 mg  5 mg Oral Daily Belkys A Regalado, MD   5 mg at 12/28/12 1111  . azithromycin (ZITHROMAX) 500 mg in dextrose 5 % 250 mL IVPB  500 mg Intravenous Q24H Christiane Ha, MD   500 mg at 12/27/12 1853  . cefTRIAXone (ROCEPHIN) 1 g in dextrose 5 % 50 mL IVPB  1 g Intravenous Q24H Christiane Ha, MD   1 g at 12/27/12 1823  . feeding supplement (ENSURE COMPLETE) liquid 237 mL  237 mL Oral TID WC Belkys A Regalado, MD   237 mL at 12/28/12 0642  . guaiFENesin-dextromethorphan (ROBITUSSIN DM) 100-10 MG/5ML syrup 5 mL  5 mL Oral Q4H PRN Christiane Ha, MD      . HYDROcodone-acetaminophen (NORCO/VICODIN) 5-325 MG per tablet 1-2 tablet  1-2 tablet Oral Q4H PRN Christiane Ha, MD   2 tablet at 12/28/12 1345  . morphine 2 MG/ML injection 2 mg  2 mg Intravenous Q3H PRN Christiane Ha, MD      . nicotine (NICODERM CQ - dosed in mg/24 hours) patch 14 mg  14 mg Transdermal Daily Rolan Lipa, NP  14 mg at 12/28/12 1112  . ondansetron (ZOFRAN) tablet 4 mg  4 mg Oral Q6H PRN Christiane Ha, MD   4 mg at 12/25/12 1732   Or  . ondansetron (ZOFRAN) injection 4 mg  4 mg Intravenous Q6H PRN Christiane Ha, MD   4 mg at 12/27/12 2007  . pantoprazole (PROTONIX) injection 40 mg  40 mg Intravenous Q12H Belkys A Regalado, MD   40 mg at 12/28/12 1111  . senna-docusate (Senokot-S) tablet 1 tablet  1 tablet Oral QHS PRN Christiane Ha, MD      . sodium chloride 0.9 % injection 3 mL  3 mL Intravenous Q12H Christiane Ha, MD   3 mL at 12/27/12 2159  . traZODone (DESYREL) tablet 50 mg  50 mg Oral QHS PRN Christiane Ha, MD          No Known Allergies:  Family History   Problem Relation Age of Onset  . ALS Mother     Deceased 34  . Other Father     Deceased from [facility], hit by drunk driver.  Hx of prostate issues  :  History   Social History  . Marital Status: Married    Spouse Name: N/A    Number of Children: N/A  . Years of Education: N/A   Occupational History  . Not on file.   Social History Main Topics  . Smoking status: Current Every Day Smoker -- 2.00 packs/day for 36 years    Types: Cigarettes  . Smokeless tobacco: Never Used  . Alcohol Use: Not on file  . Drug Use: Not on file  . Sexually Active: Not on file   Other Topics Concern  . Not on file   Social History Narrative   Works at [employer] as Conservation officer, nature.      Has 1 daughter who is 85 and plans to join the [employer] post graduation from [employer] in 2015  :  A comprehensive review of systems was negative.  Exam: Blood pressure 123/84, pulse 95, temperature 97.8 F (36.6 C), temperature source Oral, resp. rate 19, height 6' (1.829 m), weight 193 lb 12.6 oz (87.9 kg), SpO2 98.00%. General appearance: alert, cooperative and appears stated age Head: Normocephalic, without obvious abnormality, atraumatic Eyes: conjunctivae/corneas clear. PERRL, EOM's intact. Fundi benign. Nose: Nares normal. Septum midline. Mucosa normal. No drainage or sinus tenderness. Throat: lips, mucosa, and tongue normal; teeth and gums normal Neck: no adenopathy, no carotid bruit, no JVD, supple, symmetrical, trachea midline and thyroid not enlarged, symmetric, no tenderness/mass/nodules Resp: clear to auscultation bilaterally Chest wall: no tenderness Cardio: regular rate and rhythm, S1, S2 normal, no murmur, click, rub or gallop GI: soft, non-tender; bowel sounds normal; no masses,  no organomegaly Extremities: extremities normal, atraumatic, no cyanosis or edema Skin: Skin color, texture, turgor normal. No rashes or lesions Lymph nodes: Cervical, supraclavicular, and axillary nodes  normal.   Recent Labs  12/26/12 0525  WBC 7.1  HGB 15.1  HCT 45.6  PLT 244    Recent Labs  12/26/12 0525  NA 138  K 4.2  CL 104  CO2 26  GLUCOSE 104*  BUN 10  CREATININE 0.70  CALCIUM 8.7     Dg Chest 1 View  12/25/2012   *RADIOLOGY REPORT*  Clinical Data: Status post right thoracentesis.  CHEST - 1 VIEW  Comparison: 12/24/2012  Findings: Midline trachea.  Normal heart size.  A moderate right pleural effusion is minimally decreased in size.  No  left pleural effusion. No pneumothorax.  Clear left lung.  Persistent right base air space disease.  IMPRESSION: No pneumothorax.  Slight decrease in right-sided pleural effusion with persistent adjacent airspace disease.   Original Report Authenticated By: Jeronimo Greaves, M.D.   Dg Chest 2 View  12/24/2012   *RADIOLOGY REPORT*  Clinical Data: Right-sided chest pain.  Short of breath.  CHEST - 2 VIEW  Comparison: None.  Findings: Heart size is probably normal.  Mediastinal shadows are normal.  The left lung is clear.  There is a large pleural effusion on the right apparently dependently positioned.  The right upper lobe shows partial aeration.  The right lower lobe and right middle lobe or collapse.  No significant bony finding.  IMPRESSION: Large right effusion with collapse of the right middle and lower lobe.  Etiology uncertain.  ,   Original Report Authenticated By: Paulina Fusi, M.D.   Ct Angio Chest Pe W/cm &/or Wo Cm  12/24/2012   *RADIOLOGY REPORT*  Clinical Data: Shortness of breath and pleural effusion  CT ANGIOGRAPHY CHEST  Technique:  Multidetector CT imaging of the chest using the standard protocol during bolus administration of intravenous contrast. Multiplanar reconstructed images including MIPs were obtained and reviewed to evaluate the vascular anatomy.  Contrast: OMNIPAQUE IOHEXOL 350 MG/ML SOLN  Comparison: Chest radiograph 12/24/2012  Findings: There is a large, unilateral simple appearing right pleural effusion.  This  effusion causes slight leftward mediastinal shift.  Heart size is borderline enlarged.  Thoracic aorta is normal in caliber.  Central pulmonary arteries are normal in caliber and well opacified with contrast.  No filling defects are identified within the pulmonary arteries to suggest pulmonary embolism.  Evaluation of the pulmonary arteries on the right projects is somewhat limited by marked atelectasis of the right lung.  The upper thoracic esophagus demonstrates normal wall thickness and is mildly dilated with air.  At the level of the mid thoracic esophagus the esophageal lumen becomes narrowed and there is suggestion of circumferential wall thickening of the esophagus, measuring up to 1.4 cm on image number 52.  This apparent esophageal wall thickening could alternatively be due to prominent mediastinal / left hilar nodal tissue.  Subcarinal lymph node measures 10 mm short axis, mildly prominent.  There is no pleural effusion on the left.  Lung windows demonstrate a background of moderate centrilobular emphysema.  There is collapse of a significant portion of the right lower lobe and a lesser degree of collapse of the right upper lobe and right middle lobe due to the large pleural effusion.  The bronchus to the right lower lobe is not aerated, and therefore an obstructing right lower lobe endobronchial lesion cannot be excluded.  There is some respiratory motion that limits the sensitivity for detecting small nodules.  No focal nodules or masses are identified in the left lung.  There is no airspace disease on the left.  Imaged portion of the upper abdomen demonstrates normal left adrenal gland.  The right adrenal gland is not included in the imaging field.  No focal lesions are seen within the visualized portion of the liver.  Thoracic spine vertebral bodies are normal in height and alignment. No suspicious osseous lesion is identified.  IMPRESSION:  1.  Large unilateral right pleural effusion.  This finding is  suspicious for malignancy, and pleural fluid analysis should be considered.  There is collapse of the majority of the right lower lobe as well as some atelectasis of the right upper lobe  and right middle lobe. A primary lung malignancy on the right cannot be excluded; although a discrete mass is not seen, the bronchus to the right lower lobe is not aerated, and an endobronchial lesion, or collapse do to extrinsic mass effect must be considered. Infection as a cause of the large right pleural effusion is also a consideration, and clinical correlation would likely be held to distinguish these two possibilities.  2.  Apparent circumferential wall thickening focally of the mid thoracic esophagus with focal narrowing of the lumen, and proximal dilatation of the upper thoracic esophagus.  Findings can be secondary to esophageal neoplasm or esophagitis. Alternatively, ill- defined mediastinal/hilar lymphadenopathy could be causing this apparent soft tissue thickening in the region of the esophagus.  3.  Emphysema.  Findings discussed with Dr. Manus Gunning in the Emergency Department at 7:57 p.m. 12/24/2012.   Original Report Authenticated By: Britta Mccreedy, M.D.   Mr Brain Wo Contrast  12/26/2012   *RADIOLOGY REPORT*  Clinical Data: Headache. Weight loss. Tobacco use.  Large right pleural effusion concerning for malignancy.  MRI HEAD WITHOUT CONTRAST  Technique:  Multiplanar, multiecho pulse sequences of the brain and surrounding structures were obtained according to standard protocol without intravenous contrast.  Comparison: None.  Findings: There is no evidence for acute infarction, intracranial hemorrhage, mass lesion, hydrocephalus, or extra-axial fluid. There is no significant cerebral or cerebellar atrophy.  There are subcentimeter foci of increased signal in the subcortical greater than periventricular white matter, primarily involving the supratentorial region, suggesting chronic microvascular ischemic change. Flow  voids are maintained in the major intracranial vessels.  No worrisome osseous lesions.  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  Negative orbits. No mastoid disease.  Mild left ethmoid fluid.  Chronic left maxillary sinus disease with air fluid level suggesting acuity. Partial empty sella.  IMPRESSION: No acute stroke or hemorrhage.  No visualized intracranial masses although detection of metastatic disease is more sensitive following administration of IV Gadolinium.  Mild chronic microvascular ischemic change.  Incidental right suboccipital lipoma.  Probable chronic and acute left maxillary sinus disease.   Original Report Authenticated By: Davonna Belling, M.D.   US Thoracentesis Asp Pleural Space W/img Guide  12/25/2012   *RADIOLOGY REPORT*  Clinical Data:  Right-sided pleural effusion, shortness of breath, a request for diagnostic and therapeutic thoracentesis  ULTRASOUND GUIDED right THORACENTESIS  Comparison:  CT chest from 05/16  An ultrasound guided thoracentesis was thoroughly discussed with the patient and questions answered.  The benefits, risks, alternatives and complications were also discussed.  The patient understands and wishes to proceed with the procedure.  Written consent was obtained.  Ultrasound of the right chest demonstrates large effusion with numerous loculations. Fluid density appeared complex as well. Ultrasound was then used to localize and mark an adequate pocket of fluid in the right chest.  The area was then prepped and draped in the normal sterile fashion.  1% Lidocaine was used for local anesthesia.  Under ultrasound guidance a 19 gauge Yueh catheter was introduced.  Thoracentesis was performed.  The catheter was removed and a dressing applied.  Complications:  None immediate  Findings: A total of approximately 1.2 liters of bloody pleural fluid was removed. A fluid sample was sent for laboratory analysis.  IMPRESSION: Large right pleural effusion  with numerous loculations. Successful ultrasound guided right thoracentesis yielding 1.2 liters of pleural fluid.  Read by Brayton El PA-C   Original Report Authenticated By: Jolaine Click, M.D.  Assessment and Plan:   52 year old with the following issues:  1. Large right sided pleural effusion s/p thoracentesis on 5/17 with 1.2 L of bloody fluid removed. Fluid analysis consistent with exudative effusion. 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   I agree with a repeat thoracentesis to dry and once that done, I would recommend obtaining CT chest/abd/pelvis to complete the staging.  I see no need for a PET/CT at this time. This can be done as outpatient if needed.  I will arrange an out patient follow up upon his discharge for continuing oncology management. He will likely need palliative chemotherapy as outpatient   If pleural effusion returns  fast, he might needs pleurodesis vs pleural catheter.     2. Esophageal focal narrowing  . I doubt this is malignancy. He is asymptomatic at this point. I agree with possible endoscopy in the future.   3. Disposition: He still not ready for discharge. Will arrange Oncology follow up upon discharge.   Please call with questions in the mean time.

## 2012-12-28 NOTE — Progress Notes (Signed)
[facility] PROGRESS NOTE  Geoffrey Richardson VHQ:[id number] DOB: 1960/11/18 DOA: 12/24/2012 PCP: No PCP Per Patient  Assessment/Plan:  1-Right side pleural effusion: infectious vs malignancy. Patient s/P thoracentesis 5-17. Culture pleural fluid no growth to date. Pleural fluid consistent with exudative pleural effusion, LDH ratio more than 2, protein 0.5.  HIV negative.  Continue with ceftriaxone and Azithromycin day 5. Cytology with atypical cells,  I spoke with pathology concern for non small cell carcinoma, results inconclusive. Will ask pulmonary to see for further recommendation and tissue sample.   2-Wall thickening of the mid  thoracic esophagus:  Findings can be secondary to esophageal neoplasm or esophagitis. Alternatively, ill- defined mediastinal/hilar lymphadenopathy could be causing this apparent soft tissue thickening in the region of the esophagus.  -Ptient denies dysphagia, odynophagia. Continue with  protonix.  He will need at some point endoscopy. Cytology inconclusive.         3-Headaches: MRI negative.  4.Severe Malnutrition : concern for malignancy. Continue with ensure. Nutrition Cnsulted.  5-HTN: continue with  Norvasc. Improved.  6-Sick euthyroid: TSH low at 0.234.  Free T 3 2.5 and free T-4 0.83 normal. Needs repeat TSH in 4 to 6 weeks.  7-Incidental right suboccipital lipoma: Follow up out patient.    Code Status: Full Family Communication: Care discussed with patient.  Disposition Plan: to be determine.    Consultants:  IR for thoracentesis    Procedures:  Right side thoracentesis 5-17  Antibiotics:  Azithromycin 5-16  Ceftriaxone 5-16  HPI/Subjective: He relates some chest pain on ambulation and SOB.   Objective: Filed Vitals:   12/27/12 1249 12/27/12 2055 12/28/12 0545 12/28/12 1335  BP: 150/109 124/84 138/100 123/84  Pulse: 102 104 91 95  Temp: 98.2 F (36.8 C) 98.3 F (36.8 C) 98.7 F (37.1 C) 97.8 F (36.6 C)  TempSrc: Oral Oral  Oral Oral  Resp: 18 18 18 19   Height:      Weight:      SpO2: 92% 90% 98% 98%    Intake/Output Summary (Last 24 hours) at 12/28/12 1442 Last data filed at 12/28/12 1300  Gross per 24 hour  Intake   1080 ml  Output      0 ml  Net   1080 ml   Filed Weights   12/24/12 1621 12/24/12 2000  Weight: 95.255 kg (210 lb) 87.9 kg (193 lb 12.6 oz)    Exam:   General:  No distress.   Head: small mass ?  right post occipital area,   Cardiovascular: S 1, S 2 RRR  Respiratory: Crackles right side, ronchus.   Abdomen: Bs present, soft, NT  Musculoskeletal: no edema.   Data Reviewed: Basic Metabolic Panel:  Recent Labs Lab 12/24/12 1632 12/26/12 0525  NA 141 138  K 3.8 4.2  CL 107 104  CO2 24 26  GLUCOSE 96 104*  BUN 13 10  CREATININE 0.72 0.70  CALCIUM 9.1 8.7   Liver Function Tests:  Recent Labs Lab 12/24/12 1632 12/25/12 1431  AST 17  --   ALT 16  --   ALKPHOS 93  --   BILITOT 0.5  --   PROT 7.5 6.7  ALBUMIN 3.1*  --    No results found for this basename: LIPASE, AMYLASE,  in the last 168 hours No results found for this basename: AMMONIA,  in the last 168 hours CBC:  Recent Labs Lab 12/24/12 1632 12/26/12 0525  WBC 7.9 7.1  NEUTROABS 4.4  --   HGB 15.2 15.1  HCT 44.4 45.6  MCV 84.6 85.6  PLT 226 244   Cardiac Enzymes:  Recent Labs Lab 12/24/12 1634  TROPONINI <0.30   BNP (last 3 results)  Recent Labs  12/24/12 1634  PROBNP 59.5   CBG: No results found for this basename: GLUCAP,  in the last 168 hours  Recent Results (from the past 240 hour(s))  BODY FLUID CULTURE     Status: None   Collection Time    12/25/12  8:51 AM      Result Value Range Status   Specimen Description FLUID RIGHT PLEURAL   Final   Special Requests NONE   Final   Gram Stain     Final   Value: FEW WBC PRESENT, PREDOMINANTLY PMN     NO ORGANISMS SEEN   Culture NO GROWTH 3 DAYS   Final   Report Status 12/28/2012 FINAL   Final     Studies: Mr Brain Wo  Contrast  12/26/2012   *RADIOLOGY REPORT*  Clinical Data: Headache. Weight loss. Tobacco use.  Large right pleural effusion concerning for malignancy.  MRI HEAD WITHOUT CONTRAST  Technique:  Multiplanar, multiecho pulse sequences of the brain and surrounding structures were obtained according to standard protocol without intravenous contrast.  Comparison: None.  Findings: There is no evidence for acute infarction, intracranial hemorrhage, mass lesion, hydrocephalus, or extra-axial fluid. There is no significant cerebral or cerebellar atrophy.  There are subcentimeter foci of increased signal in the subcortical greater than periventricular white matter, primarily involving the supratentorial region, suggesting chronic microvascular ischemic change. Flow voids are maintained in the major intracranial vessels.  No worrisome osseous lesions.  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  Negative orbits. No mastoid disease.  Mild left ethmoid fluid.  Chronic left maxillary sinus disease with air fluid level suggesting acuity. Partial empty sella.  IMPRESSION: No acute stroke or hemorrhage.  No visualized intracranial masses although detection of metastatic disease is more sensitive following administration of IV Gadolinium.  Mild chronic microvascular ischemic change.  Incidental right suboccipital lipoma.  Probable chronic and acute left maxillary sinus disease.   Original Report Authenticated By: Davonna Belling, M.D.    Scheduled Meds: . amLODipine  5 mg Oral Daily  . azithromycin  500 mg Intravenous Q24H  . cefTRIAXone (ROCEPHIN)  IV  1 g Intravenous Q24H  . feeding supplement  237 mL Oral TID WC  . nicotine  14 mg Transdermal Daily  . pantoprazole (PROTONIX) IV  40 mg Intravenous Q12H  . sodium chloride  3 mL Intravenous Q12H   Continuous Infusions:   Principal Problem:   Pleural effusion, right Active Problems:   Tobacco abuse   Weight loss   COPD (chronic obstructive  pulmonary disease)   Severe malnutrition    Time spent: 25 minutes.     Parad[place] Vensel  [facility] Pager [phone]. If 7PM-7AM, please contact night-coverage at [email or web address], password [facility] 12/28/2012, 2:42 PM  LOS: 4 days

## 2012-12-29 ENCOUNTER — Inpatient Hospital Stay (HOSPITAL_COMMUNITY): Payer: Medicaid Other

## 2012-12-29 ENCOUNTER — Encounter (HOSPITAL_COMMUNITY): Payer: Self-pay | Admitting: Radiology

## 2012-12-29 DIAGNOSIS — E41 Nutritional marasmus: Secondary | ICD-10-CM

## 2012-12-29 LAB — BASIC METABOLIC PANEL
BUN: 12 mg/dL (ref 6–23)
Calcium: 8.8 mg/dL (ref 8.4–10.5)
GFR calc Af Amer: >90 mL/min (ref >90)
GFR calc non Af Amer: >90 mL/min (ref >90)
Potassium: 4.3 mEq/L (ref 3.5–5.1)
Sodium: 139 mEq/L (ref 135–145)

## 2012-12-29 LAB — CBC
MCH: 27.7 pg (ref 26.0–34.0)
MCHC: 32 g/dL (ref 30.0–36.0)
RDW: 14 % (ref 11.5–15.5)

## 2012-12-29 MED ORDER — PANTOPRAZOLE SODIUM 40 MG PO TBEC
40.0000 mg | DELAYED_RELEASE_TABLET | Freq: Two times a day (BID) | ORAL | Status: DC
  Administered 2012-12-29 – 2012-12-30 (×3): 40 mg via ORAL
  Filled 2012-12-29 (×3): qty 1

## 2012-12-29 MED ORDER — IOHEXOL 300 MG/ML  SOLN
100.0000 mL | Freq: Once | INTRAMUSCULAR | Status: AC | PRN
  Administered 2012-12-29: 100 mL via INTRAVENOUS

## 2012-12-29 MED ORDER — TRAMADOL HCL 50 MG PO TABS
50.0000 mg | ORAL_TABLET | Freq: Four times a day (QID) | ORAL | Status: DC | PRN
  Administered 2012-12-29 (×2): 50 mg via ORAL
  Filled 2012-12-29 (×2): qty 1

## 2012-12-29 MED ORDER — IOHEXOL 300 MG/ML  SOLN
25.0000 mL | INTRAMUSCULAR | Status: AC
  Administered 2012-12-29 (×2): 25 mL via ORAL

## 2012-12-29 NOTE — Procedures (Addendum)
Right sided effusion appears more complex and loculated than on previous thoracentesis. Please see PACS report for details and images.  Successful US guided right thoracentesis. Yielded of bloody pleural fluid. Pt tolerated procedure well. No immediate complications.  Specimen was sent for labs. CXR deferred as pt having CT chest post procedure  Brayton El PA-C 12/29/2012 12:09 PM

## 2012-12-29 NOTE — Progress Notes (Signed)
PULMONARY  / CRITICAL CARE MEDICINE  Name: Geoffrey Richardson MRN: [id number] DOB: 15-Apr-1961    ADMISSION DATE:  12/24/2012 CONSULTATION DATE:  12/28/12  REFERRING MD :  Dr. Sunnie Nielsen PRIMARY SERVICE:  TR[facility]  CHIEF COMPLAINT:  Pleural Effusion  BRIEF PATIENT DESCRIPTION: 52 y/o M admitted on 5/16 with cough, SOB, weakness & R pleuritic chest pain. Found to have R exudative pleural effusion with atypical cells on cytology concerning for non-small cell carcinoma.  PCCM consulted for further work up / evaluation.     SIGNIFICANT EVENTS / STUDIES:  5/16 - Admit with cough, SOB, weakness & R pleuritic chest pain.   5/16 - CTA Chest>>>lg R pleural effusion, collapse of RLL, no discrete mass noted, circumferential wall thickening of mid-thoracic esophagus with focal narrowing of lumen, ill definined mediastinal / hilar adenopathy, emphysema 5/17 - R Thora with 1.2 L of bloody pleural fluid removed, exudative effusion based on Lights (Protein ratio 0.64, LDH ratio 2.47)  LINES / TUBES:   CULTURES: 5/17 Pleural Fluid>>>neg 5/17 Cytology>>>atypical cells suspicious for malignancy.  Cytology slides show highly atypical cells which are suspicious for non-small cell carcinoma.  Immunohistochemical stains are performed. The cells are positive for MOC-31, CEA, cytokeratin 5/6 while negative for WT-1, TTF-1 and calretnin.  Although the findings likely represent non-small cell carcinoma, the positive cytokeratin 5/6 staining coupled with the morphology warrants the above diagnosis.  ANTIBIOTICS: Rocephin 5/16>>> Azithro 5/1/6>>>  SUBJECTIVE: c/o ongoing pain, R side/back.  Feels too sleepy with vicodin.   VITAL SIGNS: Temp:  [97.5 F (36.4 C)-98.3 F (36.8 C)] 98.3 F (36.8 C) (05/21 0647) Pulse Rate:  [93-105] 105 (05/21 0923) Resp:  [19-20] 19 (05/21 0647) BP: (123-136)/(82-102) 136/102 mmHg (05/21 0923) SpO2:  [92 %-98 %] 93 % (05/21 0647) FiO2 (%):  [2 %] 2 % (05/20 1335)  PHYSICAL  EXAMINATION: General:  wdwn adult male in NAD Neuro:  AAOx4, speech clear, MAE HEENT:  Mm pink /moist, no JVD Cardiovascular:  s1s2 rrr, no m/r/g Lungs:  resp's even/non-labored on RA, left lung essentially clear, R side significantly diminished.  Abdomen:  Round/soft, bsx4 active Musculoskeletal:  No acute deformities Skin:  Warm/dry, no edema   Recent Labs Lab 12/24/12 1632 12/26/12 0525 12/29/12 0415  NA 141 138 139  K 3.8 4.2 4.3  CL 107 104 102  CO2 24 26 29   BUN 13 10 12   CREATININE 0.72 0.70 0.72  GLUCOSE 96 104* 95    Recent Labs Lab 12/24/12 1632 12/26/12 0525 12/29/12 0415  HGB 15.2 15.1 15.0  HCT 44.4 45.6 46.9  WBC 7.9 7.1 7.0  PLT 226 244 251   Dg Chest Portable 1 View  12/29/2012   *RADIOLOGY REPORT*  Clinical Data: Cough.  Follow up of right-sided pleural effusion.  PORTABLE CHEST - 1 VIEW  Comparison: 12/25/2012  Findings: Midline trachea.  Borderline cardiomegaly.  The moderate right-sided pleural effusion is not significantly changed. No pneumothorax.  No left-sided pleural fluid.  Slight worsening in right lung aeration with persistent dense consolidation at the inferior right lung and developing atelectasis in the remaining right upper lobe.  IMPRESSION: Similar moderate right-sided pleural effusion.  Slight worsening right-sided aeration with dense airspace disease and developing atelectasis more superiorly.   Original Report Authenticated By: Jeronimo Greaves, M.D.     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  Right Malignant Pleural Effusion - Large right sided  pleural effusion s/p thoracentesis on 5/17 with 1.2 L of bloody fluid removed.  Fluid analysis consistent with exudative effusion.  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      Plan: -Repeat thora planned 5/21 (per IR) for further staining, especially regarding special stains to delineate  adeno vs squamous cell -- may consider pleur-x placement depending on reaccumulation -CT scan to assess for a predominant mass once drained dry >> final read pending, my read > some residual ? Loculated pleuaral fluid, RLL consolidation, ? R hilar LAD vs mass without overt endobronchial lesion. Will wait for the formal read from radiology but wonder if he wouldn't benefit from VATS +/- pleurex placement. Will review films with TCTS once radiology read availavble -likely outpatient PET scan with oncology f/u per [facility] -consider CT ABD / PELVIS to eval for lesions / mets once effusion drained  -oncology following   Danford Bad, NP 12/29/2012  9:33 AM Pager: [phone] or [phone]  *Care during the described time interval was provided by me and/or other providers on the critical care team. I have reviewed this patient's available data, including medical history, events of note, physical examination and test results as part of my evaluation.  Levy Pupa, MD, PhD 12/29/2012, 2:04 PM [facility] [phone] or if no answer [phone]

## 2012-12-29 NOTE — Progress Notes (Addendum)
[facility] PROGRESS NOTE  Geoffrey Richardson ZOX:[id number] DOB: 1960/11/22 DOA: 12/24/2012 PCP: No PCP Per Patient  Assessment/Plan:  1-Right side pleural effusion: malignant Squamous vs Adeno, initial cytology inconclusive Repeat Thora today,  May need pleurx catheter, Pulm and Onc following CT chest, Abd/pelvis today On ceftriaxone and Azithromycin day 6/7  2-Wall thickening of the mid  thoracic esophagus:  Findings can be secondary to esophageal neoplasm or esophagitis. Alternatively, ill- defined mediastinal/hilar lymphadenopathy could be causing this apparent soft tissue thickening in the region of the esophagus.  -Ptient denies dysphagia, odynophagia. Continue with  protonix.  He will need at some point endoscopy. Cytology inconclusive.         3-Headaches: MRI negative.   4.Severe Malnutrition : concern for malignancy. Continue with ensure. Nutrition Cnsulted.   5-HTN: continue with  Norvasc. Improved.   6-Sick euthyroid: TSH low at 0.234.  Free T 3 2.5 and free T-4 0.83 normal. Needs repeat TSH in 4 to 6 weeks.   7-Incidental right suboccipital lipoma: Follow up out patient.    Code Status: Full Family Communication: Care discussed with patient.  Disposition Plan: home soon   Consultants:  IR for thoracentesis  Pulm  Procedures:  Right side thoracentesis 5-17  Repeat R thora 5/21  Antibiotics:  Azithromycin 5-16  Ceftriaxone 5-16  HPI/Subjective: He relates some chest pain on ambulation and SOB.   Objective: Filed Vitals:   12/29/12 0923 12/29/12 1140 12/29/12 1209 12/29/12 1345  BP: 136/102 135/98 122/85 129/88  Pulse: 105   97  Temp:    97.9 F (36.6 C)  TempSrc:    Oral  Resp:    18  Height:      Weight:      SpO2:    98%    Intake/Output Summary (Last 24 hours) at 12/29/12 1554 Last data filed at 12/29/12 1300  Gross per 24 hour  Intake   1080 ml  Output      0 ml  Net   1080 ml   Filed Weights   12/24/12 1621 12/24/12 2000   Weight: 95.255 kg (210 lb) 87.9 kg (193 lb 12.6 oz)    Exam:   General:  No distress.   Head: small mass ?  right post occipital area,   Cardiovascular: S 1, S 2 RRR  Respiratory:decraesed BS on R  Abdomen: Bs present, soft, NT  Musculoskeletal: no edema.   Data Reviewed: Basic Metabolic Panel:  Recent Labs Lab 12/24/12 1632 12/26/12 0525 12/29/12 0415  NA 141 138 139  K 3.8 4.2 4.3  CL 107 104 102  CO2 24 26 29   GLUCOSE 96 104* 95  BUN 13 10 12   CREATININE 0.72 0.70 0.72  CALCIUM 9.1 8.7 8.8   Liver Function Tests:  Recent Labs Lab 12/24/12 1632 12/25/12 1431  AST 17  --   ALT 16  --   ALKPHOS 93  --   BILITOT 0.5  --   PROT 7.5 6.7  ALBUMIN 3.1*  --    No results found for this basename: LIPASE, AMYLASE,  in the last 168 hours No results found for this basename: AMMONIA,  in the last 168 hours CBC:  Recent Labs Lab 12/24/12 1632 12/26/12 0525 12/29/12 0415  WBC 7.9 7.1 7.0  NEUTROABS 4.4  --   --   HGB 15.2 15.1 15.0  HCT 44.4 45.6 46.9  MCV 84.6 85.6 86.5  PLT 226 244 251   Cardiac Enzymes:  Recent Labs Lab 12/24/12 1634  TROPONINI <0.30   BNP (last 3 results)  Recent Labs  12/24/12 1634  PROBNP 59.5   CBG: No results found for this basename: GLUCAP,  in the last 168 hours  Recent Results (from the past 240 hour(s))  BODY FLUID CULTURE     Status: None   Collection Time    12/25/12  8:51 AM      Result Value Range Status   Specimen Description FLUID RIGHT PLEURAL   Final   Special Requests NONE   Final   Gram Stain     Final   Value: FEW WBC PRESENT, PREDOMINANTLY PMN     NO ORGANISMS SEEN   Culture NO GROWTH 3 DAYS   Final   Report Status 12/28/2012 FINAL   Final     Studies: Ct Chest Wo Contrast  12/29/2012   *RADIOLOGY REPORT*  Clinical Data: Right thoracentesis.  Lung cancer.  CT CHEST WITHOUT CONTRAST  Technique:  Multidetector CT imaging of the chest was performed following the standard protocol without IV  contrast.  Comparison: 12/24/2012  Findings: The right pleural effusion has improved after thoracentesis.  A rind of soft tissues surrounds the pleural effusion.  There is also extension of pleural effusion anterior to the right upper lobe.  These findings raise possibility of a complicated pleural effusion as one would see with an inflammatory process or malignancy.  Scattered and patchy hyperdense areas within the right pleural effusion have developed which may represent a small amount of hemorrhage during the thoracentesis.  Diffuse centrilobular emphysema is unchanged.  5 mm right upper lobe nodule on image 21.  There is considerable volume loss in the right middle and lower lobes.  Aeration has markedly improved in the right upper lobe after thoracentesis.  No evidence of left nodule lower left mass.  Prominence of the esophageal wall just below the carina is unchanged.  Small mediastinal nodes are scattered.  No pneumothorax.  Simple cyst in the right kidney.  Small gastrohepatic ligament nodes.  IMPRESSION: Right pleural effusion and and right upper lobe volume loss of both improved post thoracentesis without pneumothorax.  There is evidence that the right pleural effusion is a complicated pleural effusion as described above.  Ancillary findings described previously including circumferential wall thickening of the mid esophagus are unchanged.  A 5 mm right upper lobe pulmonary nodule is not better identified. If the patient is at high risk for bronchogenic carcinoma, follow- up chest CT at 6-12 months is recommended.  If the patient is at low risk for bronchogenic carcinoma, follow-up chest CT at 12 months is recommended.  This recommendation follows the consensus statement: Guidelines for Management of Small Pulmonary Nodules Detected on CT Scans: A Statement from the Fleischner Society as published in Radiology 2005; 237:395-400.   Original Report Authenticated By: Jolaine Click, M.D.   Dg Chest Portable 1  View  12/29/2012   *RADIOLOGY REPORT*  Clinical Data: Cough.  Follow up of right-sided pleural effusion.  PORTABLE CHEST - 1 VIEW  Comparison: 12/25/2012  Findings: Midline trachea.  Borderline cardiomegaly.  The moderate right-sided pleural effusion is not significantly changed. No pneumothorax.  No left-sided pleural fluid.  Slight worsening in right lung aeration with persistent dense consolidation at the inferior right lung and developing atelectasis in the remaining right upper lobe.  IMPRESSION: Similar moderate right-sided pleural effusion.  Slight worsening right-sided aeration with dense airspace disease and developing atelectasis more superiorly.   Original Report Authenticated By: Jeronimo Greaves, M.D.   US  Thoracentesis Asp Pleural Space W/img Guide  12/29/2012   *RADIOLOGY REPORT*  Clinical Data:  Dyspnea, right-sided pleural effusion  ULTRASOUND GUIDED right THORACENTESIS  Comparison:  Previous thoracentesis  An ultrasound guided thoracentesis was thoroughly discussed with the patient and questions answered.  The benefits, risks, alternatives and complications were also discussed.  The patient understands and wishes to proceed with the procedure.  Written consent was obtained.  Ultrasound of the right chest again demonstrates large complex appearing effusion with increased number of loculations/septations since the last study.  Ultrasound was performed to localize and mark the largest pocket of fluid in the right chest.  The area was then prepped and draped in the normal sterile fashion.  1% Lidocaine was used for local anesthesia.  Under ultrasound guidance a 19 gauge Yueh catheter was introduced.  Thoracentesis was performed.  The catheter was removed and a dressing applied.  Complications:  None immediate  Findings: A total of approximately 950 ml of bloody pleural fluid was removed. A fluid sample was sent for laboratory analysis.  IMPRESSION: Large complex right pleural effusion. Successful  ultrasound guided right thoracentesis yielding 950 ml of pleural fluid. The complexity and loculated nature of the effusion prevents complete evacuation by thoracentesis.  Read by Brayton El PA-C   Original Report Authenticated By: Tacey Ruiz, MD    Scheduled Meds: . amLODipine  5 mg Oral Daily  . azithromycin  500 mg Intravenous Q24H  . cefTRIAXone (ROCEPHIN)  IV  1 g Intravenous Q24H  . feeding supplement  237 mL Oral TID WC  . nicotine  14 mg Transdermal Daily  . pantoprazole  40 mg Oral BID  . sodium chloride  3 mL Intravenous Q12H   Continuous Infusions:   Principal Problem:   Pleural effusion, right Active Problems:   Tobacco abuse   Weight loss   COPD (chronic obstructive pulmonary disease)   Severe malnutrition    Time spent: 25 minutes.     [facility]  [facility] Pager [phone]. If 7PM-7AM, please contact night-coverage at [email or web address], password [facility] 12/29/2012, 3:54 PM  LOS: 5 days

## 2012-12-30 DIAGNOSIS — J910 Malignant pleural effusion: Principal | ICD-10-CM | POA: Diagnosis present

## 2012-12-30 DIAGNOSIS — F17200 Nicotine dependence, unspecified, uncomplicated: Secondary | ICD-10-CM

## 2012-12-30 DIAGNOSIS — J449 Chronic obstructive pulmonary disease, unspecified: Secondary | ICD-10-CM

## 2012-12-30 DIAGNOSIS — E41 Nutritional marasmus: Secondary | ICD-10-CM

## 2012-12-30 MED ORDER — ENSURE COMPLETE PO LIQD: 237.0000 mL | Freq: Three times a day (TID) | ORAL | Status: DC

## 2012-12-30 MED ORDER — NICOTINE 14 MG/24HR TD PT24: 1.0000 | MEDICATED_PATCH | Freq: Every day | TRANSDERMAL | Status: DC

## 2012-12-30 MED ORDER — PANTOPRAZOLE SODIUM 40 MG PO TBEC: 40.0000 mg | DELAYED_RELEASE_TABLET | Freq: Every day | ORAL | Status: DC

## 2012-12-30 MED ORDER — ALBUTEROL SULFATE HFA 108 (90 BASE) MCG/ACT IN AERS: 2.0000 | INHALATION_SPRAY | RESPIRATORY_TRACT | Status: DC | PRN

## 2012-12-30 MED ORDER — HYDROCODONE-ACETAMINOPHEN 5-325 MG PO TABS: 1.0000 | ORAL_TABLET | Freq: Four times a day (QID) | ORAL | Status: DC | PRN

## 2012-12-30 MED ORDER — UNABLE TO FIND: Status: DC

## 2012-12-30 NOTE — Progress Notes (Signed)
PULMONARY  / CRITICAL CARE MEDICINE  Name: Laiken Nohr MRN: [id number] DOB: 08/13/1960    ADMISSION DATE:  12/24/2012 CONSULTATION DATE:  12/28/12  REFERRING MD :  Dr. Sunnie Nielsen PRIMARY SERVICE:  TRH  CHIEF COMPLAINT:  Pleural Effusion  BRIEF PATIENT DESCRIPTION: 52 y/o M admitted on 5/16 with cough, SOB, weakness & R pleuritic chest pain. Found to have R exudative pleural effusion with atypical cells on cytology concerning for non-small cell carcinoma.  PCCM consulted for further work up / evaluation.     SIGNIFICANT EVENTS / STUDIES:  5/16 - Admit with cough, SOB, weakness & R pleuritic chest pain.   5/16 - CTA Chest>>>lg R pleural effusion, collapse of RLL, no discrete mass noted, circumferential wall thickening of mid-thoracic esophagus with focal narrowing of lumen, ill definined mediastinal / hilar adenopathy, emphysema 5/17 - R Thora with 1.2 L of bloody pleural fluid removed, exudative effusion based on Lights (Protein ratio 0.64, LDH ratio 2.47) 5/21 - repeat R thora 5/21 CT chest>>Right pleural effusion and and right upper lobe volume loss of both improved post thoracentesis without pneumothorax. There is evidence that the right pleural effusion is a complicated pleural effusion as described above.   Ancillary findings described previously including circumferential wall thickening of the mid esophagus are unchanged. A 5 mm right upper lobe pulmonary nodule is not better identified. R hilar LAD vs mass without overt endobronchial lesion   LINES / TUBES: none  CULTURES: 5/17 Pleural Fluid>>>neg 5/17 Cytology>>>atypical cells suspicious for malignancy.  Cytology slides show highly atypical cells which are suspicious for non-small cell carcinoma.  Immunohistochemical stains are performed. The cells are positive for MOC-31, CEA, cytokeratin 5/6 while negative for WT-1, TTF-1 and calretnin.  Although the findings likely represent non-small cell carcinoma, the positive  cytokeratin 5/6 staining coupled with the morphology warrants the above diagnosis.  ANTIBIOTICS: Rocephin 5/16>>> 5/22 Azithro 5/1/6>>> 5/22  SUBJECTIVE: c/o ongoing pain but improved, R side/back.   VITAL SIGNS: Temp:  [97.4 F (36.3 C)-98.3 F (36.8 C)] 98.3 F (36.8 C) (05/22 0251) Pulse Rate:  [92-102] 92 (05/22 0927) Resp:  [18] 18 (05/22 0251) BP: (112-134)/(71-96) 134/96 mmHg (05/22 0927) SpO2:  [93 %-98 %] 93 % (05/22 0251)  PHYSICAL EXAMINATION: General:  wdwn adult male in NAD Neuro:  AAOx4, speech clear, MAE HEENT:  Mm pink /moist, no JVD Cardiovascular:  s1s2 rrr, no m/r/g Lungs:  resp's even/non-labored on RA, left lung essentially clear, R side significantly diminished.  Abdomen:  Round/soft, bsx4 active Musculoskeletal:  No acute deformities Skin:  Warm/dry, no edema   Recent Labs Lab 12/24/12 1632 12/26/12 0525 12/29/12 0415  NA 141 138 139  K 3.8 4.2 4.3  CL 107 104 102  CO2 24 26 29   BUN 13 10 12   CREATININE 0.72 0.70 0.72  GLUCOSE 96 104* 95    Recent Labs Lab 12/24/12 1632 12/26/12 0525 12/29/12 0415  HGB 15.2 15.1 15.0  HCT 44.4 45.6 46.9  WBC 7.9 7.1 7.0  PLT 226 244 251   Ct Chest Wo Contrast  12/29/2012   *RADIOLOGY REPORT*  Clinical Data: Right thoracentesis.  Lung cancer.  CT CHEST WITHOUT CONTRAST  Technique:  Multidetector CT imaging of the chest was performed following the standard protocol without IV contrast.  Comparison: 12/24/2012  Findings: The right pleural effusion has improved after thoracentesis.  A rind of soft tissues surrounds the pleural effusion.  There is also extension of pleural effusion anterior to the right upper lobe.  These findings raise possibility of a complicated pleural effusion as one would see with an inflammatory process or malignancy.  Scattered and patchy hyperdense areas within the right pleural effusion have developed which may represent a small amount of hemorrhage during the thoracentesis.  Diffuse  centrilobular emphysema is unchanged.  5 mm right upper lobe nodule on image 21.  There is considerable volume loss in the right middle and lower lobes.  Aeration has markedly improved in the right upper lobe after thoracentesis.  No evidence of left nodule lower left mass.  Prominence of the esophageal wall just below the carina is unchanged.  Small mediastinal nodes are scattered.  No pneumothorax.  Simple cyst in the right kidney.  Small gastrohepatic ligament nodes.  IMPRESSION: Right pleural effusion and and right upper lobe volume loss of both improved post thoracentesis without pneumothorax.  There is evidence that the right pleural effusion is a complicated pleural effusion as described above.  Ancillary findings described previously including circumferential wall thickening of the mid esophagus are unchanged.  A 5 mm right upper lobe pulmonary nodule is not better identified. If the patient is at high risk for bronchogenic carcinoma, follow- up chest CT at 6-12 months is recommended.  If the patient is at low risk for bronchogenic carcinoma, follow-up chest CT at 12 months is recommended.  This recommendation follows the consensus statement: Guidelines for Management of Small Pulmonary Nodules Detected on CT Scans: A Statement from the Fleischner Society as published in Radiology 2005; 237:395-400.   Original Report Authenticated By: Jolaine Click, M.D.   Ct Abdomen Pelvis W Contrast  12/29/2012   *RADIOLOGY REPORT*  Clinical Data: Lung carcinoma.  Weight loss.  Staging.  CT ABDOMEN AND PELVIS WITH CONTRAST  Technique:  Multidetector CT imaging of the abdomen and pelvis was performed following the standard protocol during bolus administration of intravenous contrast.  Contrast: OMNIPAQUE IOHEXOL 300 MG/ML  SOLN  Comparison: Chest CT on 12/29/2012  Findings: Moderate right pleural effusion with associated pleural thickening again demonstrated as seen on recent chest CT.  The liver, gallbladder,  adrenal glands, pancreas, and spleen are normal in appearance.  Tiny nonobstructing right renal calculi are seen as well as a small right renal cyst.  No evidence of renal mass or hydronephrosis.  No other soft tissue masses or lymphadenopathy identified within the abdomen or pelvis.  No evidence of inflammatory process or abnormal fluid collections within the abdomen or pelvis.  No evidence of bowel wall thickening, dilatation, or hernia.  No suspicious bone lesions are identified.  IMPRESSION:  1.  No evidence of abdominal or pelvic metastatic disease. 2.  Malignant appearing right pleural effusion again noted. 3.  Nonobstructing right nephrolithiasis.   Original Report Authenticated By: Myles Rosenthal, M.D.   Dg Chest Portable 1 View  12/29/2012   *RADIOLOGY REPORT*  Clinical Data: Cough.  Follow up of right-sided pleural effusion.  PORTABLE CHEST - 1 VIEW  Comparison: 12/25/2012  Findings: Midline trachea.  Borderline cardiomegaly.  The moderate right-sided pleural effusion is not significantly changed. No pneumothorax.  No left-sided pleural fluid.  Slight worsening in right lung aeration with persistent dense consolidation at the inferior right lung and developing atelectasis in the remaining right upper lobe.  IMPRESSION: Similar moderate right-sided pleural effusion.  Slight worsening right-sided aeration with dense airspace disease and developing atelectasis more superiorly.   Original Report Authenticated By: Jeronimo Greaves, M.D.   US Thoracentesis Asp Pleural Space W/img Guide  12/29/2012   *RADIOLOGY  REPORT*  Clinical Data:  Dyspnea, right-sided pleural effusion  ULTRASOUND GUIDED right THORACENTESIS  Comparison:  Previous thoracentesis  An ultrasound guided thoracentesis was thoroughly discussed with the patient and questions answered.  The benefits, risks, alternatives and complications were also discussed.  The patient understands and wishes to proceed with the procedure.  Written consent was obtained.   Ultrasound of the right chest again demonstrates large complex appearing effusion with increased number of loculations/septations since the last study.  Ultrasound was performed to localize and mark the largest pocket of fluid in the right chest.  The area was then prepped and draped in the normal sterile fashion.  1% Lidocaine was used for local anesthesia.  Under ultrasound guidance a 19 gauge Yueh catheter was introduced.  Thoracentesis was performed.  The catheter was removed and a dressing applied.  Complications:  None immediate  Findings: A total of approximately 950 ml of bloody pleural fluid was removed. A fluid sample was sent for laboratory analysis.  IMPRESSION: Large complex right pleural effusion. Successful ultrasound guided right thoracentesis yielding 950 ml of pleural fluid. The complexity and loculated nature of the effusion prevents complete evacuation by thoracentesis.  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  Right Malignant Pleural Effusion - Large right sided pleural effusion s/p thoracentesis on 5/17 with 1.2 L of bloody fluid removed.  Fluid analysis consistent with exudative effusion.  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      Plan: -Repeat thora 5/21 with 950 ml, f/u CT chest reveals loculated effusion but no predominant mass -Rec CVTS eval for VATS +/- pleurex placement IF no dx achieved by the repeat cytology -likely outpatient PET scan with oncology f/u per [facility] -oncology following  -no lesions identified on CT abd/pelvis    WHITEHEART,KATHRYN, NP 12/30/2012  11:41 AM Pager: [phone] or [phone]  *Care during the described time interval was provided by me and/or other providers on the critical care team. I have reviewed this patient's available data, including  medical history, events of note, physical examination and test results as part of my evaluation.  Levy Pupa, MD, PhD 12/30/2012, 1:43 PM [facility] [phone] or if no answer [phone]

## 2012-12-30 NOTE — Progress Notes (Signed)
12/30/2012 3:43 PM Nursing note Discharge avs form, rx, medications already taken today and those due this evening given and explained to patient and family member. Correct use of nicotine patches reviewed. Home oxygen tank delivered to patients room and turned on prior to discharge as well as checked for patency. Pt. Placed on 2L [place] per order prior to discharge. Follow up appointments and when to call MD reviewed. D/c iv line. D/c tele. D/c home per orders.  Ricca Melgarejo, Blanchard Kelch

## 2012-12-30 NOTE — Progress Notes (Signed)
12/30/2012 2:44 PM Nursing note Pt. Ambulated on room air in hallway. Resting on room air sats 92%. Ambulating on room air sats dropped to 86%. 2L o2 [place] placed. O2 sat raised to 94%. Pt. Tolerated ambulation well. MD updated on findings. Orders received. Will continue to monitor patient.  Wyona Neils, Blanchard Kelch

## 2012-12-30 NOTE — Care Management Note (Signed)
    Page 1 of 1   12/30/2012     4:31:52 PM   CARE MANAGEMENT NOTE 12/30/2012  Patient:  [facility]   Account Number:  [id number]  Date Initiated:  12/30/2012  Documentation initiated by:  Carmilla Granville  Subjective/Objective Assessment:   PT ADM ON 12/24/12 WITH RT MALIGNANT PLEURAL EFFUSION.  PTA, PT INDEPENDENT, LIVES WITH SPOUSE.     Action/Plan:   PT DESATS WITH AMBULATION TO 86%.  WILL NEED HOME OXYGEN SET UP.  REFERRAL TO [facility].   Anticipated DC Date:  12/30/2012   Anticipated DC Plan:  HOME/SELF CARE      DC Planning Services  CM consult      Choice offered to / List presented to:     DME arranged  OXYGEN      DME agency  [facility]        Status of service:  Completed, signed off Medicare Important Message given?   (If response is "NO", the following Medicare IM given date fields will be blank) Date Medicare IM given:   Date Additional Medicare IM given:    Discharge Disposition:  HOME/SELF CARE  Per UR Regulation:  Reviewed for med. necessity/level of care/duration of stay  If discussed at Long Length of Stay Meetings, dates discussed:    Comments:  12/30/12 Colton Engdahl,RN,BSN [phone] PORTABLE O2 TANK DELIVERED TO PT'S ROOM PRIOR TO DC.

## 2012-12-30 NOTE — Progress Notes (Signed)
Events noted.  Patient S/P repeat thoracentesis. CT scan chest/abd/pelvis reviewed without a clear cut mass/lesions to explain the malignant effusion.  I agree with Dr. Delton Coombes, he will likely need pleural catheter vs. VATS pleurodesis.  I hope the pathology will be conclusive for adenocacinoma vs squamous  Cancer. Will continue to follow progress.

## 2012-12-30 NOTE — Progress Notes (Signed)
SATURATION QUALIFICATIONS: (This note is used to comply with regulatory documentation for home oxygen)  Patient Saturations on Room Air at Rest = 92%  Patient Saturations on Room Air while Ambulating = 86%  Patient Saturations on 2 Liters of oxygen while Ambulating = 94%  Please briefly explain why patient needs home oxygen: malignant pleural effusion

## 2012-12-30 NOTE — Progress Notes (Addendum)
[facility] PROGRESS NOTE  Geoffrey Richardson ZOX:[id number] DOB: [birth year] DOA: 12/24/2012 PCP: No PCP Per Patient  Assessment/Plan:  1-Recurrent complicated pleural effusion: malignant Initial thoracentesis 5/17 cytology with malignant/atypical cells: non small cell:  Squamous vs [place], cytology inconclusive Repeat Thora 5/21, cytology pending  D/w Pulm Dr.Byrum, at this point awaiting repeat Biopsy and CXR in 1 week to see if fluid re accumulates quickly to determine need for pleurx Pulm and Onc following CT Abd/pelvis without metastatic disease On ceftriaxone and Azithromycin day 7/7, stop abx today FU with Tammy Parrett NP, [place] pul[facility] with CXR 5/30 at 4:pm  2-Wall thickening of the mid  thoracic esophagus:  Findings can be secondary to esophageal neoplasm or esophagitis. Alternatively, ill- defined mediastinal/hilar lymphadenopathy could be causing this apparent soft tissue thickening in the region of the esophagus.  -P denies dysphagia, odynophagia. Continue with  protonix.  He will need an endoscopy down the road         3-Headaches: MRI negative.   4.Malnutrition : concern for malignancy. Continue with ensure. Nutrition Consulted.   5-HTN: continue with  Norvasc. Improved.   6-Sick euthyroid: TSH low at 0.234.  Free T 3 2.5 and free T-4 0.83 normal. Needs repeat TSH in 4 to 6 weeks.   7-Incidental right suboccipital lipoma:  Code Status: Full Family Communication: Care discussed with patient.  Disposition Plan: home soon   Consultants:  IR for thoracentesis  Pulm  Procedures:  Right side thoracentesis 5-17  Repeat R thora 5/21  Antibiotics:  Azithromycin 5-16  Ceftriaxone 5-16  HPI/Subjective: He relates some chest pain on ambulation and SOB.   Objective: Filed Vitals:   12/29/12 1345 12/29/12 2012 12/30/12 0251 12/30/12 0927  BP: 129/88 123/92 112/71 134/96  Pulse: 97 102 101 92  Temp: 97.9 F (36.6 C) 97.4 F (36.3 C) 98.3 F (36.8 C)    TempSrc: Oral Oral Oral   Resp: 18 18 18    Height:      Weight:      SpO2: 98% 96% 93%     Intake/Output Summary (Last 24 hours) at 12/30/12 1213 Last data filed at 12/30/12 0800  Gross per 24 hour  Intake    720 ml  Output      0 ml  Net    720 ml   Filed Weights   12/24/12 1621 12/24/12 2000  Weight: 95.255 kg (210 lb) 87.9 kg (193 lb 12.6 oz)    Exam:   General:  No distress.   Head: small mass ?  right post occipital area,   Cardiovascular: S 1, S 2 RRR  Respiratory:decraesed BS on R  Abdomen: Bs present, soft, NT  Musculoskeletal: no edema.   Data Reviewed: Basic Metabolic Panel:  Recent Labs Lab 12/24/12 1632 12/26/12 0525 12/29/12 0415  NA 141 138 139  K 3.8 4.2 4.3  CL 107 104 102  CO2 24 26 29   GLUCOSE 96 104* 95  BUN 13 10 12   CREATININE 0.72 0.70 0.72  CALCIUM 9.1 8.7 8.8   Liver Function Tests:  Recent Labs Lab 12/24/12 1632 12/25/12 1431  AST 17  --   ALT 16  --   ALKPHOS 93  --   BILITOT 0.5  --   PROT 7.5 6.7  ALBUMIN 3.1*  --    No results found for this basename: LIPASE, AMYLASE,  in the last 168 hours No results found for this basename: AMMONIA,  in the last 168 hours CBC:  Recent Labs Lab 12/24/12  1632 12/26/12 0525 12/29/12 0415  WBC 7.9 7.1 7.0  NEUTROABS 4.4  --   --   HGB 15.2 15.1 15.0  HCT 44.4 45.6 46.9  MCV 84.6 85.6 86.5  PLT 226 244 251   Cardiac Enzymes:  Recent Labs Lab 12/24/12 1634  TROPONINI <0.30   BNP (last 3 results)  Recent Labs  12/24/12 1634  PROBNP 59.5   CBG: No results found for this basename: GLUCAP,  in the last 168 hours  Recent Results (from the past 240 hour(s))  BODY FLUID CULTURE     Status: None   Collection Time    12/25/12  8:51 AM      Result Value Range Status   Specimen Description FLUID RIGHT PLEURAL   Final   Special Requests NONE   Final   Gram Stain     Final   Value: FEW WBC PRESENT, PREDOMINANTLY PMN     NO ORGANISMS SEEN   Culture NO GROWTH 3 DAYS    Final   Report Status 12/28/2012 FINAL   Final     Studies: Ct Chest Wo Contrast  12/29/2012   *RADIOLOGY REPORT*  Clinical Data: Right thoracentesis.  Lung cancer.  CT CHEST WITHOUT CONTRAST  Technique:  Multidetector CT imaging of the chest was performed following the standard protocol without IV contrast.  Comparison: 12/24/2012  Findings: The right pleural effusion has improved after thoracentesis.  A rind of soft tissues surrounds the pleural effusion.  There is also extension of pleural effusion anterior to the right upper lobe.  These findings raise possibility of a complicated pleural effusion as one would see with an inflammatory process or malignancy.  Scattered and patchy hyperdense areas within the right pleural effusion have developed which may represent a small amount of hemorrhage during the thoracentesis.  Diffuse centrilobular emphysema is unchanged.  5 mm right upper lobe nodule on image 21.  There is considerable volume loss in the right middle and lower lobes.  Aeration has markedly improved in the right upper lobe after thoracentesis.  No evidence of left nodule lower left mass.  Prominence of the esophageal wall just below the carina is unchanged.  Small mediastinal nodes are scattered.  No pneumothorax.  Simple cyst in the right kidney.  Small gastrohepatic ligament nodes.  IMPRESSION: Right pleural effusion and and right upper lobe volume loss of both improved post thoracentesis without pneumothorax.  There is evidence that the right pleural effusion is a complicated pleural effusion as described above.  Ancillary findings described previously including circumferential wall thickening of the mid esophagus are unchanged.  A 5 mm right upper lobe pulmonary nodule is not better identified. If the patient is at high risk for bronchogenic carcinoma, follow- up chest CT at 6-12 months is recommended.  If the patient is at low risk for bronchogenic carcinoma, follow-up chest CT at 12 months  is recommended.  This recommendation follows the consensus statement: Guidelines for Management of Small Pulmonary Nodules Detected on CT Scans: A Statement from the Fleischner Society as published in Radiology 2005; 237:395-400.   Original Report Authenticated By: Jolaine Click, M.D.   Ct Abdomen Pelvis W Contrast  12/29/2012   *RADIOLOGY REPORT*  Clinical Data: Lung carcinoma.  Weight loss.  Staging.  CT ABDOMEN AND PELVIS WITH CONTRAST  Technique:  Multidetector CT imaging of the abdomen and pelvis was performed following the standard protocol during bolus administration of intravenous contrast.  Contrast: OMNIPAQUE IOHEXOL 300 MG/ML  SOLN  Comparison: Chest  CT on 12/29/2012  Findings: Moderate right pleural effusion with associated pleural thickening again demonstrated as seen on recent chest CT.  The liver, gallbladder, adrenal glands, pancreas, and spleen are normal in appearance.  Tiny nonobstructing right renal calculi are seen as well as a small right renal cyst.  No evidence of renal mass or hydronephrosis.  No other soft tissue masses or lymphadenopathy identified within the abdomen or pelvis.  No evidence of inflammatory process or abnormal fluid collections within the abdomen or pelvis.  No evidence of bowel wall thickening, dilatation, or hernia.  No suspicious bone lesions are identified.  IMPRESSION:  1.  No evidence of abdominal or pelvic metastatic disease. 2.  Malignant appearing right pleural effusion again noted. 3.  Nonobstructing right nephrolithiasis.   Original Report Authenticated By: Myles Rosenthal, M.D.   Dg Chest Portable 1 View  12/29/2012   *RADIOLOGY REPORT*  Clinical Data: Cough.  Follow up of right-sided pleural effusion.  PORTABLE CHEST - 1 VIEW  Comparison: 12/25/2012  Findings: Midline trachea.  Borderline cardiomegaly.  The moderate right-sided pleural effusion is not significantly changed. No pneumothorax.  No left-sided pleural fluid.  Slight worsening in right lung  aeration with persistent dense consolidation at the inferior right lung and developing atelectasis in the remaining right upper lobe.  IMPRESSION: Similar moderate right-sided pleural effusion.  Slight worsening right-sided aeration with dense airspace disease and developing atelectasis more superiorly.   Original Report Authenticated By: Jeronimo Greaves, M.D.   US Thoracentesis Asp Pleural Space W/img Guide  12/29/2012   *RADIOLOGY REPORT*  Clinical Data:  Dyspnea, right-sided pleural effusion  ULTRASOUND GUIDED right THORACENTESIS  Comparison:  Previous thoracentesis  An ultrasound guided thoracentesis was thoroughly discussed with the patient and questions answered.  The benefits, risks, alternatives and complications were also discussed.  The patient understands and wishes to proceed with the procedure.  Written consent was obtained.  Ultrasound of the right chest again demonstrates large complex appearing effusion with increased number of loculations/septations since the last study.  Ultrasound was performed to localize and mark the largest pocket of fluid in the right chest.  The area was then prepped and draped in the normal sterile fashion.  1% Lidocaine was used for local anesthesia.  Under ultrasound guidance a 19 gauge Yueh catheter was introduced.  Thoracentesis was performed.  The catheter was removed and a dressing applied.  Complications:  None immediate  Findings: A total of approximately 950 ml of bloody pleural fluid was removed. A fluid sample was sent for laboratory analysis.  IMPRESSION: Large complex right pleural effusion. Successful ultrasound guided right thoracentesis yielding 950 ml of pleural fluid. The complexity and loculated nature of the effusion prevents complete evacuation by thoracentesis.  Read by Brayton El PA-C   Original Report Authenticated By: Tacey Ruiz, MD    Scheduled Meds: . amLODipine  5 mg Oral Daily  . azithromycin  500 mg Intravenous Q24H  . cefTRIAXone  (ROCEPHIN)  IV  1 g Intravenous Q24H  . feeding supplement  237 mL Oral TID WC  . nicotine  14 mg Transdermal Daily  . pantoprazole  40 mg Oral BID  . sodium chloride  3 mL Intravenous Q12H   Continuous Infusions:   Principal Problem:   Pleural effusion, right Active Problems:   Tobacco abuse   Weight loss   COPD (chronic obstructive pulmonary disease)   Severe malnutrition    Time spent: 25 minutes.     [facility]  [facility] Pager  [phone]. If 7PM-7AM, please contact night-coverage at [email or web address], password [facility] 12/30/2012, 12:13 PM  LOS: 6 days

## 2012-12-30 NOTE — Discharge Summary (Addendum)
Physician Discharge Summary  Geoffrey Richardson AVW:[id number] DOB: 1960-12-13 DOA: 12/24/2012  PCP: No PCP Per Patient  Admit date: 12/24/2012 Discharge date: 12/30/2012  Time spent: 45 minutes  Recommendations for Outpatient Follow-up:  1. Rubye Oaks, NP, [place] [facility] on 5/30 at 4pm with CXR 2. Dr.[name] in 1-2 weeks 3. FU with GI /EGD in 2 weeks  Discharge Diagnoses:    Malignant R pleural effusion   Tobacco abuse   Weight loss   COPD (chronic obstructive pulmonary disease)   Severe malnutrition   Esophageal wall thickening, needs GI eval for endoscopy   Discharge Condition:improved  Diet recommendation: regular  Filed Weights   12/24/12 1621 12/24/12 2000  Weight: 95.255 kg (210 lb) 87.9 kg (193 lb 12.6 oz)    History of present illness:  Geoffrey Richardson is a 52 y.o. male who presents to the emergency room with shortness of breath cough weakness and right-sided pleuritic chest pain. He started having a cough about 6 months ago. It's usually white but sometimes blood tinged. He has had subjective fevers and chills. He is extremely weak. He has become short of breath. He had been smoking a pack and a half of cigarettes per day but has cut down to just a few a day. He has lost a lot of weight recently. He never sought medical advice previously. In the emergency room, he was found to have a large pleural effusion on chest x-ray. White count was normal. He was started on antibiotics, oxygen and a CT angiogram was ordered. No known lung problems previously. His oxygen saturations reportedly were in the 80s but I do not see where this was documented. Currently in the 90s on supplemental oxygen. Apparently he was quite tachycardic as well into the 120s. CT scan shows large right pleural effusion without any definite PE or mass.   Procedures:  Right side thoracentesis 5-17  Repeat R thoracentesis 5/21  Hospital Course:  1-Recurrent complicated pleural effusion: malignant  Initial  thoracentesis 5/17 cytology with malignant/atypical cells: non small cell: Squamous vs [place], cytology inconclusive  Hence underwent Repeat Thoracentesis 5/21, cytology pending  D/w Pulm Dr.Byrum, at this point awaiting repeat Biopsy and CXR in 1 week to see if fluid re accumulates quickly to determine need for pleurx  Pulm and Onc following,  FU with Tammy Parrett NP, [place] [facility] with CXR 5/30 at 4:pm  CT Abd/pelvis without metastatic disease  Was also empirically treated with ceftriaxone and Azithromycin, for 7 days then stopped abx  2-Wall thickening of the mid thoracic esophagus: Findings can be secondary to esophageal neoplasm or esophagitis. Alternatively, ill- defined mediastinal/hilar lymphadenopathy could be causing this apparent soft tissue thickening in the region of the esophagus.  -Pt denies dysphagia, odynophagia. Continue with protonix. He will need an endoscopy as outpatient  3-Headaches: MRI negative.   4.Malnutrition : Continue with ensure. Nutrition Consulted.   5-HTN: continue with Norvasc. Improved.   6-Sick euthyroid: TSH low at 0.234. Free T 3 2.5 and free T-4 0.83 normal. Needs repeat TSH in 4 to 6 weeks.   Consultations:  Pulm Dr.Byrum  Onc Dr.[name]  Discharge Exam: Filed Vitals:   12/29/12 2012 12/30/12 0251 12/30/12 0927 12/30/12 1345  BP: 123/92 112/71 134/96 125/82  Pulse: 102 101 92 98  Temp: 97.4 F (36.3 C) 98.3 F (36.8 C)  98.1 F (36.7 C)  TempSrc: Oral Oral  Oral  Resp: 18 18  19   Height:      Weight:      SpO2:  96% 93%  95%    General: AAOx3 Cardiovascular: S1S2/RRR Respiratory: CTAB, improved BS on R  Discharge Instructions  Discharge Orders   Future Appointments Provider Department Dept Phone   01/07/2013 4:15 PM Julio Sicks, NP [facility] [phone]   Future Orders Complete By Expires     Increase activity slowly  As directed         Medication List    STOP taking these medications       ibuprofen 200  MG tablet  Commonly known as:  ADVIL,MOTRIN      TAKE these medications       albuterol 108 (90 BASE) MCG/ACT inhaler  Commonly known as:  PROVENTIL HFA;VENTOLIN HFA  Inhale 2 puffs into the lungs every 4 (four) hours as needed for wheezing or shortness of breath.     feeding supplement Liqd  Take 237 mLs by mouth 3 (three) times daily with meals.     HYDROcodone-acetaminophen 5-325 MG per tablet  Commonly known as:  NORCO/VICODIN  Take 1-2 tablets by mouth every 6 (six) hours as needed for pain.     pantoprazole 40 MG tablet  Commonly known as:  PROTONIX  Take 1 tablet (40 mg total) by mouth daily.     UNABLE TO FIND  This note is to excuse Geoffrey Richardson from work 5/16 to 5/26 due to medical illness requiring hospitalization       No Known Allergies     Follow-up Information   Follow up with PARRETT,TAMMY, NP On 01/07/2013. (at 4pm, with CXR)    Contact information:   [address] [place] [address] [phone]       Follow up with [facility], MD. Schedule an appointment as soon as possible for a visit in 10 days.   Contact information:   [address] [place] [address] [phone]        The results of significant diagnostics from this hospitalization (including imaging, microbiology, ancillary and laboratory) are listed below for reference.    Significant Diagnostic Studies: Dg Chest 1 View  12/25/2012   *RADIOLOGY REPORT*  Clinical Data: Status post right thoracentesis.  CHEST - 1 VIEW  Comparison: 12/24/2012  Findings: Midline trachea.  Normal heart size.  A moderate right pleural effusion is minimally decreased in size.  No left pleural effusion. No pneumothorax.  Clear left lung.  Persistent right base air space disease.  IMPRESSION: No pneumothorax.  Slight decrease in right-sided pleural effusion with persistent adjacent airspace disease.   Original Report Authenticated By: Jeronimo Greaves, M.D.   Dg Chest 2 View  12/24/2012   *RADIOLOGY REPORT*   Clinical Data: Right-sided chest pain.  Short of breath.  CHEST - 2 VIEW  Comparison: None.  Findings: Heart size is probably normal.  Mediastinal shadows are normal.  The left lung is clear.  There is a large pleural effusion on the right apparently dependently positioned.  The right upper lobe shows partial aeration.  The right lower lobe and right middle lobe or collapse.  No significant bony finding.  IMPRESSION: Large right effusion with collapse of the right middle and lower lobe.  Etiology uncertain.  ,   Original Report Authenticated By: Paulina Fusi, M.D.   Ct Chest Wo Contrast  12/29/2012   *RADIOLOGY REPORT*  Clinical Data: Right thoracentesis.  Lung cancer.  CT CHEST WITHOUT CONTRAST  Technique:  Multidetector CT imaging of the chest was performed following the standard protocol without IV contrast.  Comparison: 12/24/2012  Findings: The right pleural effusion has improved after thoracentesis.  A rind of soft tissues surrounds the pleural effusion.  There is also extension of pleural effusion anterior to the right upper lobe.  These findings raise possibility of a complicated pleural effusion as one would see with an inflammatory process or malignancy.  Scattered and patchy hyperdense areas within the right pleural effusion have developed which may represent a small amount of hemorrhage during the thoracentesis.  Diffuse centrilobular emphysema is unchanged.  5 mm right upper lobe nodule on image 21.  There is considerable volume loss in the right middle and lower lobes.  Aeration has markedly improved in the right upper lobe after thoracentesis.  No evidence of left nodule lower left mass.  Prominence of the esophageal wall just below the carina is unchanged.  Small mediastinal nodes are scattered.  No pneumothorax.  Simple cyst in the right kidney.  Small gastrohepatic ligament nodes.  IMPRESSION: Right pleural effusion and and right upper lobe volume loss of both improved post thoracentesis without  pneumothorax.  There is evidence that the right pleural effusion is a complicated pleural effusion as described above.  Ancillary findings described previously including circumferential wall thickening of the mid esophagus are unchanged.  A 5 mm right upper lobe pulmonary nodule is not better identified. If the patient is at high risk for bronchogenic carcinoma, follow- up chest CT at 6-12 months is recommended.  If the patient is at low risk for bronchogenic carcinoma, follow-up chest CT at 12 months is recommended.  This recommendation follows the consensus statement: Guidelines for Management of Small Pulmonary Nodules Detected on CT Scans: A Statement from the Fleischner Society as published in Radiology 2005; 237:395-400.   Original Report Authenticated By: Jolaine Click, M.D.   Ct Angio Chest Pe W/cm &/or Wo Cm  12/24/2012   *RADIOLOGY REPORT*  Clinical Data: Shortness of breath and pleural effusion  CT ANGIOGRAPHY CHEST  Technique:  Multidetector CT imaging of the chest using the standard protocol during bolus administration of intravenous contrast. Multiplanar reconstructed images including MIPs were obtained and reviewed to evaluate the vascular anatomy.  Contrast: OMNIPAQUE IOHEXOL 350 MG/ML SOLN  Comparison: Chest radiograph 12/24/2012  Findings: There is a large, unilateral simple appearing right pleural effusion.  This effusion causes slight leftward mediastinal shift.  Heart size is borderline enlarged.  Thoracic aorta is normal in caliber.  Central pulmonary arteries are normal in caliber and well opacified with contrast.  No filling defects are identified within the pulmonary arteries to suggest pulmonary embolism.  Evaluation of the pulmonary arteries on the right projects is somewhat limited by marked atelectasis of the right lung.  The upper thoracic esophagus demonstrates normal wall thickness and is mildly dilated with air.  At the level of the mid thoracic esophagus the esophageal lumen  becomes narrowed and there is suggestion of circumferential wall thickening of the esophagus, measuring up to 1.4 cm on image number 52.  This apparent esophageal wall thickening could alternatively be due to prominent mediastinal / left hilar nodal tissue.  Subcarinal lymph node measures 10 mm short axis, mildly prominent.  There is no pleural effusion on the left.  Lung windows demonstrate a background of moderate centrilobular emphysema.  There is collapse of a significant portion of the right lower lobe and a lesser degree of collapse of the right upper lobe and right middle lobe due to the large pleural effusion.  The bronchus to the right lower lobe is not aerated,  and therefore an obstructing right lower lobe endobronchial lesion cannot be excluded.  There is some respiratory motion that limits the sensitivity for detecting small nodules.  No focal nodules or masses are identified in the left lung.  There is no airspace disease on the left.  Imaged portion of the upper abdomen demonstrates normal left adrenal gland.  The right adrenal gland is not included in the imaging field.  No focal lesions are seen within the visualized portion of the liver.  Thoracic spine vertebral bodies are normal in height and alignment. No suspicious osseous lesion is identified.  IMPRESSION:  1.  Large unilateral right pleural effusion.  This finding is suspicious for malignancy, and pleural fluid analysis should be considered.  There is collapse of the majority of the right lower lobe as well as some atelectasis of the right upper lobe and right middle lobe. A primary lung malignancy on the right cannot be excluded; although a discrete mass is not seen, the bronchus to the right lower lobe is not aerated, and an endobronchial lesion, or collapse do to extrinsic mass effect must be considered. Infection as a cause of the large right pleural effusion is also a consideration, and clinical correlation would likely be held to  distinguish these two possibilities.  2.  Apparent circumferential wall thickening focally of the mid thoracic esophagus with focal narrowing of the lumen, and proximal dilatation of the upper thoracic esophagus.  Findings can be secondary to esophageal neoplasm or esophagitis. Alternatively, ill- defined mediastinal/hilar lymphadenopathy could be causing this apparent soft tissue thickening in the region of the esophagus.  3.  Emphysema.  Findings discussed with Dr. Manus Gunning in the Emergency Department at 7:57 p.m. 12/24/2012.   Original Report Authenticated By: Britta Mccreedy, M.D.   Mr Brain Wo Contrast  12/26/2012   *RADIOLOGY REPORT*  Clinical Data: Headache. Weight loss. Tobacco use.  Large right pleural effusion concerning for malignancy.  MRI HEAD WITHOUT CONTRAST  Technique:  Multiplanar, multiecho pulse sequences of the brain and surrounding structures were obtained according to standard protocol without intravenous contrast.  Comparison: None.  Findings: There is no evidence for acute infarction, intracranial hemorrhage, mass lesion, hydrocephalus, or extra-axial fluid. There is no significant cerebral or cerebellar atrophy.  There are subcentimeter foci of increased signal in the subcortical greater than periventricular white matter, primarily involving the supratentorial region, suggesting chronic microvascular ischemic change. Flow voids are maintained in the major intracranial vessels.  No worrisome osseous lesions.  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  Negative orbits. No mastoid disease.  Mild left ethmoid fluid.  Chronic left maxillary sinus disease with air fluid level suggesting acuity. Partial empty sella.  IMPRESSION: No acute stroke or hemorrhage.  No visualized intracranial masses although detection of metastatic disease is more sensitive following administration of IV Gadolinium.  Mild chronic microvascular ischemic change.  Incidental right  suboccipital lipoma.  Probable chronic and acute left maxillary sinus disease.   Original Report Authenticated By: Davonna Belling, M.D.   Ct Abdomen Pelvis W Contrast  12/29/2012   *RADIOLOGY REPORT*  Clinical Data: Lung carcinoma.  Weight loss.  Staging.  CT ABDOMEN AND PELVIS WITH CONTRAST  Technique:  Multidetector CT imaging of the abdomen and pelvis was performed following the standard protocol during bolus administration of intravenous contrast.  Contrast: OMNIPAQUE IOHEXOL 300 MG/ML  SOLN  Comparison: Chest CT on 12/29/2012  Findings: Moderate right pleural effusion with associated pleural thickening again demonstrated  as seen on recent chest CT.  The liver, gallbladder, adrenal glands, pancreas, and spleen are normal in appearance.  Tiny nonobstructing right renal calculi are seen as well as a small right renal cyst.  No evidence of renal mass or hydronephrosis.  No other soft tissue masses or lymphadenopathy identified within the abdomen or pelvis.  No evidence of inflammatory process or abnormal fluid collections within the abdomen or pelvis.  No evidence of bowel wall thickening, dilatation, or hernia.  No suspicious bone lesions are identified.  IMPRESSION:  1.  No evidence of abdominal or pelvic metastatic disease. 2.  Malignant appearing right pleural effusion again noted. 3.  Nonobstructing right nephrolithiasis.   Original Report Authenticated By: Myles Rosenthal, M.D.   Dg Chest Portable 1 View  12/29/2012   *RADIOLOGY REPORT*  Clinical Data: Cough.  Follow up of right-sided pleural effusion.  PORTABLE CHEST - 1 VIEW  Comparison: 12/25/2012  Findings: Midline trachea.  Borderline cardiomegaly.  The moderate right-sided pleural effusion is not significantly changed. No pneumothorax.  No left-sided pleural fluid.  Slight worsening in right lung aeration with persistent dense consolidation at the inferior right lung and developing atelectasis in the remaining right upper lobe.  IMPRESSION: Similar  moderate right-sided pleural effusion.  Slight worsening right-sided aeration with dense airspace disease and developing atelectasis more superiorly.   Original Report Authenticated By: Jeronimo Greaves, M.D.   US Thoracentesis Asp Pleural Space W/img Guide  12/29/2012   *RADIOLOGY REPORT*  Clinical Data:  Dyspnea, right-sided pleural effusion  ULTRASOUND GUIDED right THORACENTESIS  Comparison:  Previous thoracentesis  An ultrasound guided thoracentesis was thoroughly discussed with the patient and questions answered.  The benefits, risks, alternatives and complications were also discussed.  The patient understands and wishes to proceed with the procedure.  Written consent was obtained.  Ultrasound of the right chest again demonstrates large complex appearing effusion with increased number of loculations/septations since the last study.  Ultrasound was performed to localize and mark the largest pocket of fluid in the right chest.  The area was then prepped and draped in the normal sterile fashion.  1% Lidocaine was used for local anesthesia.  Under ultrasound guidance a 19 gauge Yueh catheter was introduced.  Thoracentesis was performed.  The catheter was removed and a dressing applied.  Complications:  None immediate  Findings: A total of approximately 950 ml of bloody pleural fluid was removed. A fluid sample was sent for laboratory analysis.  IMPRESSION: Large complex right pleural effusion. Successful ultrasound guided right thoracentesis yielding 950 ml of pleural fluid. The complexity and loculated nature of the effusion prevents complete evacuation by thoracentesis.  Read by Brayton El PA-C   Original Report Authenticated By: Tacey Ruiz, MD   US Thoracentesis Asp Pleural Space W/img Guide  12/25/2012   *RADIOLOGY REPORT*  Clinical Data:  Right-sided pleural effusion, shortness of breath, a request for diagnostic and therapeutic thoracentesis  ULTRASOUND GUIDED right THORACENTESIS  Comparison:  CT chest  from 05/16  An ultrasound guided thoracentesis was thoroughly discussed with the patient and questions answered.  The benefits, risks, alternatives and complications were also discussed.  The patient understands and wishes to proceed with the procedure.  Written consent was obtained.  Ultrasound of the right chest demonstrates large effusion with numerous loculations. Fluid density appeared complex as well. Ultrasound was then used to localize and mark an adequate pocket of fluid in the right chest.  The area was then prepped and draped in the normal sterile  fashion.  1% Lidocaine was used for local anesthesia.  Under ultrasound guidance a 19 gauge Yueh catheter was introduced.  Thoracentesis was performed.  The catheter was removed and a dressing applied.  Complications:  None immediate  Findings: A total of approximately 1.2 liters of bloody pleural fluid was removed. A fluid sample was sent for laboratory analysis.  IMPRESSION: Large right pleural effusion with numerous loculations. Successful ultrasound guided right thoracentesis yielding 1.2 liters of pleural fluid.  Read by Brayton El PA-C   Original Report Authenticated By: Jolaine Click, M.D.    Microbiology: Recent Results (from the past 240 hour(s))  BODY FLUID CULTURE     Status: None   Collection Time    12/25/12  8:51 AM      Result Value Range Status   Specimen Description FLUID RIGHT PLEURAL   Final   Special Requests NONE   Final   Gram Stain     Final   Value: FEW WBC PRESENT, PREDOMINANTLY PMN     NO ORGANISMS SEEN   Culture NO GROWTH 3 DAYS   Final   Report Status 12/28/2012 FINAL   Final     Labs: Basic Metabolic Panel:  Recent Labs Lab 12/24/12 1632 12/26/12 0525 12/29/12 0415  NA 141 138 139  K 3.8 4.2 4.3  CL 107 104 102  CO2 24 26 29   GLUCOSE 96 104* 95  BUN 13 10 12   CREATININE 0.72 0.70 0.72  CALCIUM 9.1 8.7 8.8   Liver Function Tests:  Recent Labs Lab 12/24/12 1632 12/25/12 1431  AST 17  --   ALT  16  --   ALKPHOS 93  --   BILITOT 0.5  --   PROT 7.5 6.7  ALBUMIN 3.1*  --    No results found for this basename: LIPASE, AMYLASE,  in the last 168 hours No results found for this basename: AMMONIA,  in the last 168 hours CBC:  Recent Labs Lab 12/24/12 1632 12/26/12 0525 12/29/12 0415  WBC 7.9 7.1 7.0  NEUTROABS 4.4  --   --   HGB 15.2 15.1 15.0  HCT 44.4 45.6 46.9  MCV 84.6 85.6 86.5  PLT 226 244 251   Cardiac Enzymes:  Recent Labs Lab 12/24/12 1634  TROPONINI <0.30   BNP: BNP (last 3 results)  Recent Labs  12/24/12 1634  PROBNP 59.5   CBG: No results found for this basename: GLUCAP,  in the last 168 hours     Signed:  Marlin Brys  [facility] 12/30/2012, 2:40 PM

## 2012-12-30 NOTE — Progress Notes (Signed)
NUTRITION FOLLOW UP  DOCUMENTATION CODES Per approved criteria  -Severe malnutrition in the context of chronic illness   Intervention:    Continue Ensure Complete 3 times daily (350 kcals, 13 gm protein per 8 fl oz bottle)  RD to follow for nutrition care plan  Nutrition Dx:   Malnutrition related to very poor appetite/increased nutrient needs as evidenced by 18% weight loss x 6 months, severe muscle wasting and estimated intake of < 75% in > 1 month, ongoing  Goal:   Oral intake with meals & supplements to meet >/= 90% of estimated nutrition needs, met  Monitor:   PO & supplemental intake, weight, labs, I/O's  Assessment:   Patient s/p thoracentesis 5/21 ---> sided effusion appears more complex and loculated from previous thoracentesis 5/17.  Patient states his appetite is good.  PO intake 100% per flowsheet records.  Reports he is drinking his Ensure Complete supplements.  Height: Ht Readings from Last 1 Encounters:  12/24/12 6' (1.829 m)    Weight Status:   Wt Readings from Last 1 Encounters:  12/24/12 193 lb 12.6 oz (87.9 kg)    Re-estimated needs:  Kcal: 2200-2400 Protein: 110-120 gm Fluid: 2.2-2.4 L  Skin: Intact  Diet Order: General   Intake/Output Summary (Last 24 hours) at 12/30/12 1426 Last data filed at 12/30/12 1300  Gross per 24 hour  Intake    721 ml  Output      0 ml  Net    721 ml    Last BM: 5/19  Labs:   Recent Labs Lab 12/24/12 1632 12/26/12 0525 12/29/12 0415  NA 141 138 139  K 3.8 4.2 4.3  CL 107 104 102  CO2 24 26 29   BUN 13 10 12   CREATININE 0.72 0.70 0.72  CALCIUM 9.1 8.7 8.8  GLUCOSE 96 104* 95    Scheduled Meds: . amLODipine  5 mg Oral Daily  . feeding supplement  237 mL Oral TID WC  . nicotine  14 mg Transdermal Daily  . pantoprazole  40 mg Oral BID  . sodium chloride  3 mL Intravenous Q12H    Continuous Infusions:   Maureen Chatters, RD, LDN Pager #: [phone] After-Hours Pager #: [phone]

## 2013-01-06 ENCOUNTER — Telehealth: Payer: Self-pay | Admitting: Oncology

## 2013-01-06 NOTE — Telephone Encounter (Signed)
Pt scheduled to see Dr. Clelia Croft 06/11 @ 10. Hospital F/U  Welcome packet mailed.

## 2013-01-07 ENCOUNTER — Ambulatory Visit (INDEPENDENT_AMBULATORY_CARE_PROVIDER_SITE_OTHER): Payer: Medicaid Other | Admitting: Adult Health

## 2013-01-07 ENCOUNTER — Ambulatory Visit (INDEPENDENT_AMBULATORY_CARE_PROVIDER_SITE_OTHER)
Admission: RE | Admit: 2013-01-07 | Discharge: 2013-01-07 | Disposition: A | Discharge status: Home / Self Care | Payer: Medicaid Other | Source: Ambulatory Visit | Attending: Adult Health | Admitting: Adult Health

## 2013-01-07 ENCOUNTER — Encounter: Payer: Self-pay | Admitting: Adult Health

## 2013-01-07 VITALS — BP 138/86 | HR 97 | Temp 98.7°F | Ht 71.5 in | Wt 206.8 lb

## 2013-01-07 DIAGNOSIS — J90 Pleural effusion, not elsewhere classified: Secondary | ICD-10-CM

## 2013-01-07 DIAGNOSIS — J449 Chronic obstructive pulmonary disease, unspecified: Secondary | ICD-10-CM

## 2013-01-07 DIAGNOSIS — J910 Malignant pleural effusion: Secondary | ICD-10-CM

## 2013-01-07 NOTE — Assessment & Plan Note (Signed)
Will need further in future  Cont on current regimen .  Check PFT in future , staging/tx plan  Congratulated on smoking cessation

## 2013-01-07 NOTE — Patient Instructions (Addendum)
Continue on Oxygen 2 l/m continuously .  We are referring you to [facility] - thoracic surgeon to evaluate for pleural effusion drain.  We will be in touch on Monday 6/2 with appointment time  We will be setting up PET scan once pleural effusion is drained.  Follow up Dr. Clelia Croft on 01/19/13 as planned  If you get worse over the weekend go to ER.  Please contact office for sooner follow up if symptoms do not improve or worsen or seek emergency care

## 2013-01-07 NOTE — Progress Notes (Signed)
  Subjective:    Patient ID: Geoffrey Richardson, male    DOB: 09-21-1960, 53 y.o.   MRN: [id number]  HPI 52 y/o M admitted on 5/16 with cough, SOB, weakness & R pleuritic chest pain wt loss, . Found to have R exudative pleural effusion with atypical cells on cytology concerning for non-small cell carcinoma. PCCM consulted for further work up / evaluation.  Initial thoracentesis 5/17 cytology with malignant/atypical cells: non small cell: Squamous vs Adeno, cytology inconclusive  Hence underwent Repeat Thoracentesis 5/21,   Cytology returned  findings are consistent with a poorly differentiated non-small cell carcinoma CT Abd/pelvis without metastatic disease  MRI brain w/ no mets  Noted.  He is comfortable with breathing at rest on O2.  CXR today shows similar moderate to large right  pleural effusion with adjacent airspace disease. No hemoptysis  n/v, fever , syncope.       Review of Systems Constitutional:   No  weight loss, night sweats,  Fevers, chills, fatigue, or  lassitude.  HEENT:   No headaches,  Difficulty swallowing,  Tooth/dental problems, or  Sore throat,                No sneezing, itching, ear ache, nasal congestion, post nasal drip,   CV:  No Orthopnea, PND, swelling in lower extremities, anasarca, dizziness, palpitations, syncope.   GI  No heartburn, indigestion, abdominal pain, nausea, vomiting, diarrhea, change in bowel habits, loss of appetite, bloody stools.   Resp:  No chest wall deformity  Skin: no rash or lesions.  GU: no dysuria, change in color of urine, no urgency or frequency.  No flank pain, no hematuria   MS:  No joint pain or swelling.  No decreased range of motion.  No back pain.  Psych:  No change in mood or affect. No depression or anxiety.  No memory loss.          Objective:   Physical Exam GEN: A/Ox3; pleasant , NAD, thin male   HEENT:  [place]/AT,  EACs-clear, TMs-wnl, NOSE-clear, THROAT-clear, no lesions, no postnasal drip or exudate noted.    NECK:  Supple w/ fair ROM; no JVD; normal carotid impulses w/o bruits; no thyromegaly or nodules palpated; no lymphadenopathy.  RESP  Dull BS on right , no accessory muscle use, no dullness to percussion  CARD:  RRR, no m/r/g  , no peripheral edema, pulses intact, no cyanosis or clubbing.  GI:   Soft & nt; nml bowel sounds; no organomegaly or masses detected.  Musco: Warm bil, no deformities or joint swelling noted.   Neuro: alert, no focal deficits noted.    Skin: Warm, no lesions or rashes '      Assessment & Plan:

## 2013-01-07 NOTE — Assessment & Plan Note (Addendum)
Cytology positive for non small cell carcinoma  CT chest /abd/pelvis and MRI brain w/out identifiable primary , no evidence of mets on MRI brain or abd/pelvis.  Did have some wall thickening of mid thoracic esophagus ? May need further workup .  Refer to [facility] for persistent right pleural effusion for consideration for pleurX Once effusion is drained will need PET scan  Case discussed in detail with cxr reviewed w/ Dr. Delton Coombes

## 2013-01-10 ENCOUNTER — Telehealth: Payer: Self-pay | Admitting: Adult Health

## 2013-01-10 NOTE — Telephone Encounter (Signed)
Pt notified of this appt Geoffrey Richardson ° °

## 2013-01-10 NOTE — Addendum Note (Signed)
Addended by: Boone Master E on: 01/10/2013 08:54 AM   Modules accepted: Orders

## 2013-01-12 ENCOUNTER — Other Ambulatory Visit: Payer: Self-pay | Admitting: *Deleted

## 2013-01-12 ENCOUNTER — Encounter (HOSPITAL_COMMUNITY): Payer: Self-pay

## 2013-01-12 ENCOUNTER — Ambulatory Visit (HOSPITAL_COMMUNITY)
Admission: RE | Admit: 2013-01-12 | Discharge: 2013-01-12 | Disposition: A | Discharge status: Home / Self Care | Payer: Medicaid Other | Source: Ambulatory Visit | Attending: Cardiothoracic Surgery | Admitting: Cardiothoracic Surgery

## 2013-01-12 ENCOUNTER — Encounter: Payer: Self-pay | Admitting: Cardiothoracic Surgery

## 2013-01-12 ENCOUNTER — Other Ambulatory Visit: Payer: Self-pay

## 2013-01-12 ENCOUNTER — Institutional Professional Consult (permissible substitution) (INDEPENDENT_AMBULATORY_CARE_PROVIDER_SITE_OTHER): Payer: Medicaid Other | Admitting: Cardiothoracic Surgery

## 2013-01-12 ENCOUNTER — Encounter (HOSPITAL_COMMUNITY)
Admission: RE | Admit: 2013-01-12 | Discharge: 2013-01-12 | Disposition: A | Discharge status: Home / Self Care | Payer: Medicaid Other | Source: Ambulatory Visit | Attending: Cardiothoracic Surgery | Admitting: Cardiothoracic Surgery

## 2013-01-12 VITALS — BP 133/87 | HR 90 | Resp 20 | Ht 71.5 in | Wt 206.0 lb

## 2013-01-12 VITALS — BP 126/88 | HR 89 | Temp 98.0°F | Resp 20 | Ht 71.5 in | Wt 200.6 lb

## 2013-01-12 DIAGNOSIS — J910 Malignant pleural effusion: Secondary | ICD-10-CM

## 2013-01-12 DIAGNOSIS — J90 Pleural effusion, not elsewhere classified: Secondary | ICD-10-CM

## 2013-01-12 DIAGNOSIS — R0602 Shortness of breath: Secondary | ICD-10-CM | POA: Insufficient documentation

## 2013-01-12 DIAGNOSIS — J438 Other emphysema: Secondary | ICD-10-CM | POA: Insufficient documentation

## 2013-01-12 DIAGNOSIS — Z01818 Encounter for other preprocedural examination: Secondary | ICD-10-CM | POA: Insufficient documentation

## 2013-01-12 DIAGNOSIS — Z01812 Encounter for preprocedural laboratory examination: Secondary | ICD-10-CM | POA: Insufficient documentation

## 2013-01-12 DIAGNOSIS — Z01810 Encounter for preprocedural cardiovascular examination: Secondary | ICD-10-CM | POA: Insufficient documentation

## 2013-01-12 HISTORY — DX: Nocturia: R35.1

## 2013-01-12 HISTORY — DX: Emphysema, unspecified: J43.9

## 2013-01-12 HISTORY — DX: Shortness of breath: R06.02

## 2013-01-12 HISTORY — DX: Headache: R51

## 2013-01-12 HISTORY — DX: Gastro-esophageal reflux disease without esophagitis: K21.9

## 2013-01-12 HISTORY — DX: Hypothyroidism, unspecified: E03.9

## 2013-01-12 HISTORY — DX: Insomnia, unspecified: G47.00

## 2013-01-12 LAB — PROTIME-INR
INR: 1.06 (ref 0.00–1.49)
Prothrombin Time: 13.7 seconds (ref 11.6–15.2)

## 2013-01-12 LAB — BLOOD GAS, ARTERIAL
Acid-Base Excess: 2.2 mmol/L — ABNORMAL HIGH (ref 0.0–2.0)
Bicarbonate: 26.6 mEq/L — ABNORMAL HIGH (ref 20.0–24.0)
Drawn by: 181601
O2 Content: 2 L/min
O2 Saturation: 95.7 %
Patient temperature: 98.6
TCO2: 28 mmol/L (ref 0–100)
pCO2 arterial: 44.5 mmHg (ref 35.0–45.0)
pH, Arterial: 7.394 (ref 7.350–7.450)
pO2, Arterial: 82.9 mmHg (ref 80.0–100.0)

## 2013-01-12 LAB — COMPREHENSIVE METABOLIC PANEL
ALT: 32 U/L (ref 0–53)
AST: 20 U/L (ref 0–37)
Albumin: 2.7 g/dL — ABNORMAL LOW (ref 3.5–5.2)
Alkaline Phosphatase: 109 U/L (ref 39–117)
BUN: 12 mg/dL (ref 6–23)
CO2: 25 mEq/L (ref 19–32)
Calcium: 9.2 mg/dL (ref 8.4–10.5)
Chloride: 104 mEq/L (ref 96–112)
Creatinine, Ser: 0.58 mg/dL (ref 0.50–1.35)
GFR calc Af Amer: >90 mL/min (ref >90)
GFR calc non Af Amer: >90 mL/min (ref >90)
Glucose, Bld: 94 mg/dL (ref 70–99)
Potassium: 3.9 mEq/L (ref 3.5–5.1)
Sodium: 138 mEq/L (ref 135–145)
Total Bilirubin: 0.2 mg/dL — ABNORMAL LOW (ref 0.3–1.2)
Total Protein: 7.8 g/dL (ref 6.0–8.3)

## 2013-01-12 LAB — TYPE AND SCREEN
ABO/RH(D): A POS
Antibody Screen: NEGATIVE

## 2013-01-12 LAB — SURGICAL PCR SCREEN
MRSA, PCR: NEGATIVE
Staphylococcus aureus: NEGATIVE

## 2013-01-12 LAB — URINE MICROSCOPIC-ADD ON

## 2013-01-12 LAB — ABO/RH: ABO/RH(D): A POS

## 2013-01-12 LAB — CBC
HCT: 42.1 % (ref 39.0–52.0)
Hemoglobin: 14 g/dL (ref 13.0–17.0)
MCH: 28.2 pg (ref 26.0–34.0)
MCHC: 33.3 g/dL (ref 30.0–36.0)
MCV: 84.9 fL (ref 78.0–100.0)
Platelets: 270 10*3/uL (ref 150–400)
RBC: 4.96 MIL/uL (ref 4.22–5.81)
RDW: 13.4 % (ref 11.5–15.5)
WBC: 6.9 10*3/uL (ref 4.0–10.5)

## 2013-01-12 LAB — URINALYSIS, ROUTINE W REFLEX MICROSCOPIC
Glucose, UA: NEGATIVE mg/dL
Ketones, ur: 15 mg/dL — AB
Leukocytes, UA: NEGATIVE
Nitrite: NEGATIVE
Protein, ur: NEGATIVE mg/dL
Specific Gravity, Urine: 1.031 — ABNORMAL HIGH (ref 1.005–1.030)
Urobilinogen, UA: 1 mg/dL (ref 0.0–1.0)
pH: 5.5 (ref 5.0–8.0)

## 2013-01-12 LAB — APTT: aPTT: 32 seconds (ref 24–37)

## 2013-01-12 NOTE — Pre-Procedure Instructions (Signed)
Geoffrey Richardson  01/12/2013   Your procedure is scheduled on:  Fri, June 6 @ 7:30 AM  Report to [facility] at 5:30 AM.  Call this number if you have problems the morning of surgery: [phone]   Remember:   Do not eat food or drink liquids after midnight.   Take these medicines the morning of surgery with A SIP OF WATER: Albuterol<Bring Your Inhaler With You>,Pain Pill(if needed),and Pantoprazole(Protonix)   Do not wear jewelry  Do not wear lotions, powders, or colognes. You may wear deodorant.  Men may shave face and neck.  Do not bring valuables to the hospital.  [facility] is not responsible                   for any belongings or valuables.  Contacts, dentures or bridgework may not be worn into surgery.  Leave suitcase in the car. After surgery it may be brought to your room.  For patients admitted to the hospital, checkout time is 11:00 AM the day of  discharge.   Patients discharged the day of surgery will not be allowed to drive  home.    Special Instructions: Shower using CHG 2 nights before surgery and the night before surgery.  If you shower the day of surgery use CHG.  Use special wash - you have one bottle of CHG for all showers.  You should use approximately 1/3 of the bottle for each shower.   Please read over the following fact sheets that you were given: Pain Booklet, Coughing and Deep Breathing, Blood Transfusion Information, MRSA Information and Surgical Site Infection Prevention

## 2013-01-12 NOTE — Progress Notes (Signed)
Pt doesn't have a cardiologist  Denies ever having an echo/stress test/heart cath  Dr.[name] is Medical Md at [facility]  EKG in epic from 12/24/12  To do CXR today d/t orders

## 2013-01-12 NOTE — Progress Notes (Signed)
PCP is [name],FIRAS, MD ferring Provider is Parrett, Virgel Bouquet, NP    History and physical   Chief Complaint  Patient presents with  . Pleural Effusion    surgical eval on right pleural effusion   HPI: 52 year old Afro-American male with heavy smoking history recently diagnosed with advanced stage lung cancer. Second thoracentesis of right pleural fluid demonstrates malignant cells--poorly differentiated non-small cell carcinoma of the lung. Chest CT scan did not demonstrate a predominant mass. Today tissue has not been obtained but the diagnosis is based on pleural fluid cytology. Patient has oncology therapy scheduled with Dr. Clelia Croft. A PET scan has not been completed.  The patient has complained of significant anterior and posterior right chest pain. He also has had significant shortness of breath temporarily relieved with thoracentesis of lower 1 L of fluid. Last chest x-ray 5 days ago shows reaccumulation of large right pleural effusion. The patient is currently on home oxygen and a saturation is 93%.  The patient has had significant associated weight loss weakness but no fever hemoptysis falls. He was taking hydrocodone for his chest wall pain but has run out of the medicine and I have written a prescription for oxycodone .  The patient is been able to successfully stop smoking. No family history of lung cancer.  Past Medical History  Diagnosis Date  . Tobacco abuse     Began age 75  . Hypertension   . Pleural effusion on right   . COPD (chronic obstructive pulmonary disease)     Past Surgical History  Procedure Laterality Date  . Appendectomy      Family History  Problem Relation Age of Onset  . ALS Mother     Deceased 10  . Other Father     Deceased from [facility], hit by drunk driver.  Hx of prostate issues    Social History History  Substance Use Topics  . Smoking status: Former Smoker -- 2.00 packs/day for 36 years    Types: Cigarettes    Quit date: 12/24/2012   . Smokeless tobacco: Never Used  . Alcohol Use: No    Current Outpatient Prescriptions  Medication Sig Dispense Refill  . albuterol (PROVENTIL HFA;VENTOLIN HFA) 108 (90 BASE) MCG/ACT inhaler Inhale 2 puffs into the lungs every 4 (four) hours as needed for wheezing or shortness of breath.  1 Inhaler  1  . feeding supplement (ENSURE COMPLETE) LIQD Take 237 mLs by mouth 3 (three) times daily with meals.      Marland Kitchen HYDROcodone-acetaminophen (NORCO/VICODIN) 5-325 MG per tablet Take 1-2 tablets by mouth every 6 (six) hours as needed for pain.  30 tablet  0  . nicotine (NICODERM CQ - DOSED IN MG/24 HOURS) 14 mg/24hr patch Place 1 patch onto the skin daily.  10 patch  0  . pantoprazole (PROTONIX) 40 MG tablet Take 1 tablet (40 mg total) by mouth daily.  30 tablet  0   No current facility-administered medications for this visit.    No Known Allergies  Review of Systems General review positive for weight loss negative for fever HEENT negative for difficulty swallowing negative for active dental problems negative for change in vision or headache Thorax positive for the recurrent right malignant pleural effusion Cardiac no history arrhythmia angina or CHF Abdomen positive for prior appendectomy negative for cirrhosis jaundice ulcer disease or blood per rectum Endocrine negative for diabetes or thyroid disease Hematologic negative for bleeding disorder or blood transfusion Neurologic negative for stroke seizure Vascular negative for complaint of  DVT or TIA  BP 133/87  Pulse 90  Resp 20  Ht 5' 11.5" (1.816 m)  Wt 206 lb (93.441 kg)  BMI 28.33 kg/m2  SpO2 93% Physical Exam  Acutely ill middle-aged Afro-American male to optimize white option in no acute distress HEENT normocephalic pupils equal dentition adequate Neck without adenopathy mass or JVD Thorax diminished breath sounds on right Cardiac regular rhythm without murmur or gallop Abdomen soft without mass or organomegaly Extremities no  cyanosis edema or tenderness Vascular positive distal pulses negative calf tenderness Neurologic no focal motor deficit, right-hand dominant   Diagnostic Tests: Prior chest CT scans reviewed with patient and wife  Impression: 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 Procedure indications alternatives and risks discussed with patient and wife.

## 2013-01-13 ENCOUNTER — Encounter: Payer: Self-pay | Admitting: Adult Health

## 2013-01-13 ENCOUNTER — Encounter (HOSPITAL_COMMUNITY): Payer: Self-pay | Admitting: Pharmacist

## 2013-01-13 MED ORDER — CEFUROXIME SODIUM 1.5 G IJ SOLR
1.5000 g | INTRAMUSCULAR | Status: AC
  Administered 2013-01-14: 1.5 g via INTRAVENOUS
  Filled 2013-01-13: qty 1.5

## 2013-01-14 ENCOUNTER — Encounter (HOSPITAL_COMMUNITY): Payer: Self-pay | Admitting: Anesthesiology

## 2013-01-14 ENCOUNTER — Encounter (HOSPITAL_COMMUNITY)
Admission: RE | Disposition: A | Discharge status: Home / Self Care | Payer: Self-pay | Source: Ambulatory Visit | Attending: Cardiothoracic Surgery

## 2013-01-14 ENCOUNTER — Inpatient Hospital Stay (HOSPITAL_COMMUNITY): Payer: Medicaid Other

## 2013-01-14 ENCOUNTER — Ambulatory Visit (HOSPITAL_COMMUNITY): Payer: Medicaid Other | Admitting: Anesthesiology

## 2013-01-14 ENCOUNTER — Inpatient Hospital Stay (HOSPITAL_COMMUNITY)
Admission: RE | Admit: 2013-01-14 | Discharge: 2013-01-17 | DRG: 181 | Disposition: A | Discharge status: Home / Self Care | Payer: Medicaid Other | Source: Ambulatory Visit | Attending: Cardiothoracic Surgery | Admitting: Cardiothoracic Surgery

## 2013-01-14 DIAGNOSIS — J910 Malignant pleural effusion: Secondary | ICD-10-CM | POA: Diagnosis present

## 2013-01-14 DIAGNOSIS — F17200 Nicotine dependence, unspecified, uncomplicated: Secondary | ICD-10-CM | POA: Diagnosis present

## 2013-01-14 DIAGNOSIS — E039 Hypothyroidism, unspecified: Secondary | ICD-10-CM | POA: Diagnosis present

## 2013-01-14 DIAGNOSIS — K219 Gastro-esophageal reflux disease without esophagitis: Secondary | ICD-10-CM | POA: Diagnosis present

## 2013-01-14 DIAGNOSIS — Z5332 Thoracoscopic surgical procedure converted to open procedure: Secondary | ICD-10-CM

## 2013-01-14 DIAGNOSIS — J4489 Other specified chronic obstructive pulmonary disease: Secondary | ICD-10-CM | POA: Diagnosis present

## 2013-01-14 DIAGNOSIS — J449 Chronic obstructive pulmonary disease, unspecified: Secondary | ICD-10-CM | POA: Diagnosis present

## 2013-01-14 DIAGNOSIS — R51 Headache: Secondary | ICD-10-CM | POA: Diagnosis present

## 2013-01-14 DIAGNOSIS — R091 Pleurisy: Secondary | ICD-10-CM | POA: Diagnosis present

## 2013-01-14 DIAGNOSIS — C3430 Malignant neoplasm of lower lobe, unspecified bronchus or lung: Principal | ICD-10-CM | POA: Diagnosis present

## 2013-01-14 DIAGNOSIS — I10 Essential (primary) hypertension: Secondary | ICD-10-CM | POA: Diagnosis present

## 2013-01-14 HISTORY — PX: VIDEO ASSISTED THORACOSCOPY (VATS) W/TALC PLEUADESIS: SHX6168

## 2013-01-14 HISTORY — PX: CHEST TUBE INSERTION: SHX231

## 2013-01-14 LAB — BLOOD GAS, ARTERIAL
Acid-Base Excess: 2.2 mmol/L — ABNORMAL HIGH (ref 0.0–2.0)
Acid-Base Excess: 1.7 mmol/L (ref 0.0–2.0)
Bicarbonate: 27.6 mEq/L — ABNORMAL HIGH (ref 20.0–24.0)
FIO2: 0.5 %
O2 Saturation: 96.7 %
Patient temperature: 98.6
RATE: 14 resp/min
TCO2: 30.6 mmol/L (ref 0–100)
TCO2: 29.5 mmol/L (ref 0–100)
pCO2 arterial: 63.1 mmHg (ref 35.0–45.0)
pCO2 arterial: 59.5 mmHg (ref 35.0–45.0)
pH, Arterial: 7.289 — ABNORMAL LOW (ref 7.350–7.450)
pO2, Arterial: 104 mmHg — ABNORMAL HIGH (ref 80.0–100.0)

## 2013-01-14 LAB — POCT I-STAT 3, ART BLOOD GAS (G3+)
Acid-Base Excess: 2 mmol/L (ref 0.0–2.0)
Acid-Base Excess: 2 mmol/L (ref 0.0–2.0)
Bicarbonate: 29.1 meq/L — ABNORMAL HIGH (ref 20.0–24.0)
Bicarbonate: 29.5 mEq/L — ABNORMAL HIGH (ref 20.0–24.0)
O2 Saturation: 90 %
O2 Saturation: 94 %
Patient temperature: 41
TCO2: 31 mmol/L (ref 0–100)
TCO2: 31 mmol/L (ref 0–100)
pCO2 arterial: 55.4 mmHg — ABNORMAL HIGH (ref 35.0–45.0)
pCO2 arterial: 67 mmHg (ref 35.0–45.0)
pH, Arterial: 7.328 — ABNORMAL LOW (ref 7.350–7.450)
pH, Arterial: 7.271 — ABNORMAL LOW (ref 7.350–7.450)
pO2, Arterial: 65 mmHg — ABNORMAL LOW (ref 80.0–100.0)
pO2, Arterial: 99 mmHg (ref 80.0–100.0)

## 2013-01-14 SURGERY — VIDEO ASSISTED THORACOSCOPY (VATS) W/TALC PLEUADESIS
Anesthesia: General | Site: Chest | Laterality: Right | Wound class: Clean Contaminated

## 2013-01-14 MED ORDER — FENTANYL 10 MCG/ML IV SOLN
INTRAVENOUS | Status: DC
  Administered 2013-01-14: 11:00:00 via INTRAVENOUS
  Administered 2013-01-14: 15 ug via INTRAVENOUS
  Administered 2013-01-14 – 2013-01-15 (×2): 45 ug via INTRAVENOUS
  Administered 2013-01-15: 30 ug via INTRAVENOUS
  Administered 2013-01-15: 105 ug via INTRAVENOUS
  Filled 2013-01-14: qty 50

## 2013-01-14 MED ORDER — SENNOSIDES-DOCUSATE SODIUM 8.6-50 MG PO TABS
1.0000 | ORAL_TABLET | Freq: Every evening | ORAL | Status: DC | PRN
  Filled 2013-01-14: qty 1

## 2013-01-14 MED ORDER — NEOSTIGMINE METHYLSULFATE 1 MG/ML IJ SOLN
INTRAMUSCULAR | Status: DC | PRN
  Administered 2013-01-14: 5 mg via INTRAVENOUS

## 2013-01-14 MED ORDER — MIDAZOLAM HCL 5 MG/5ML IJ SOLN
INTRAMUSCULAR | Status: DC | PRN
  Administered 2013-01-14 (×2): 1 mg via INTRAVENOUS

## 2013-01-14 MED ORDER — PANTOPRAZOLE SODIUM 40 MG PO TBEC
40.0000 mg | DELAYED_RELEASE_TABLET | Freq: Every day | ORAL | Status: DC
  Administered 2013-01-14 – 2013-01-16 (×3): 40 mg via ORAL
  Filled 2013-01-14 (×3): qty 1

## 2013-01-14 MED ORDER — OXYCODONE HCL 5 MG PO TABS: 5.0000 mg | ORAL_TABLET | Freq: Once | ORAL | Status: DC | PRN

## 2013-01-14 MED ORDER — DEXTROSE 5 % IV SOLN
1.5000 g | Freq: Two times a day (BID) | INTRAVENOUS | Status: AC
  Administered 2013-01-14 – 2013-01-15 (×2): 1.5 g via INTRAVENOUS
  Filled 2013-01-14 (×2): qty 1.5

## 2013-01-14 MED ORDER — OXYCODONE HCL 5 MG/5ML PO SOLN: 5.0000 mg | Freq: Once | ORAL | Status: DC | PRN

## 2013-01-14 MED ORDER — HYDROMORPHONE HCL PF 1 MG/ML IJ SOLN
0.2500 mg | INTRAMUSCULAR | Status: DC | PRN
  Administered 2013-01-14: 0.25 mg via INTRAVENOUS
  Administered 2013-01-14 (×2): 0.5 mg via INTRAVENOUS
  Administered 2013-01-14 (×2): 0.25 mg via INTRAVENOUS

## 2013-01-14 MED ORDER — VANCOMYCIN HCL IN DEXTROSE 1-5 GM/200ML-% IV SOLN
1000.0000 mg | Freq: Two times a day (BID) | INTRAVENOUS | Status: AC
  Administered 2013-01-14: 1000 mg via INTRAVENOUS
  Filled 2013-01-14: qty 200

## 2013-01-14 MED ORDER — ONDANSETRON HCL 4 MG/2ML IJ SOLN
INTRAMUSCULAR | Status: DC | PRN
  Administered 2013-01-14: 4 mg via INTRAVENOUS

## 2013-01-14 MED ORDER — 0.9 % SODIUM CHLORIDE (POUR BTL) OPTIME
TOPICAL | Status: DC | PRN
  Administered 2013-01-14: 2000 mL

## 2013-01-14 MED ORDER — OXYCODONE-ACETAMINOPHEN 5-325 MG PO TABS
1.0000 | ORAL_TABLET | ORAL | Status: DC | PRN
  Administered 2013-01-14 – 2013-01-17 (×9): 2 via ORAL
  Filled 2013-01-14 (×9): qty 2

## 2013-01-14 MED ORDER — HYDROMORPHONE HCL PF 1 MG/ML IJ SOLN
INTRAMUSCULAR | Status: AC
  Filled 2013-01-14: qty 1

## 2013-01-14 MED ORDER — MEPERIDINE HCL 25 MG/ML IJ SOLN: 6.2500 mg | INTRAMUSCULAR | Status: DC | PRN

## 2013-01-14 MED ORDER — ROCURONIUM BROMIDE 100 MG/10ML IV SOLN
INTRAVENOUS | Status: DC | PRN
  Administered 2013-01-14: 40 mg via INTRAVENOUS
  Administered 2013-01-14: 50 mg via INTRAVENOUS
  Administered 2013-01-14: 10 mg via INTRAVENOUS

## 2013-01-14 MED ORDER — LEVALBUTEROL HCL 0.63 MG/3ML IN NEBU
0.6300 mg | INHALATION_SOLUTION | Freq: Four times a day (QID) | RESPIRATORY_TRACT | Status: DC
  Administered 2013-01-14 – 2013-01-17 (×9): 0.63 mg via RESPIRATORY_TRACT
  Filled 2013-01-14 (×19): qty 3

## 2013-01-14 MED ORDER — DIPHENHYDRAMINE HCL 50 MG/ML IJ SOLN: 12.5000 mg | Freq: Four times a day (QID) | INTRAMUSCULAR | Status: DC | PRN

## 2013-01-14 MED ORDER — ONDANSETRON HCL 4 MG/2ML IJ SOLN
4.0000 mg | Freq: Four times a day (QID) | INTRAMUSCULAR | Status: DC | PRN
  Administered 2013-01-15: 4 mg via INTRAVENOUS
  Filled 2013-01-14: qty 2

## 2013-01-14 MED ORDER — DEXTROSE-NACL 5-0.45 % IV SOLN
INTRAVENOUS | Status: DC
  Administered 2013-01-14: 14:00:00 via INTRAVENOUS
  Administered 2013-01-15: 20 mL/h via INTRAVENOUS

## 2013-01-14 MED ORDER — OXYCODONE HCL 5 MG PO TABS
5.0000 mg | ORAL_TABLET | ORAL | Status: AC | PRN
  Administered 2013-01-14 – 2013-01-15 (×2): 10 mg via ORAL
  Filled 2013-01-14 (×2): qty 2

## 2013-01-14 MED ORDER — NICOTINE 14 MG/24HR TD PT24
14.0000 mg | MEDICATED_PATCH | Freq: Every day | TRANSDERMAL | Status: DC
  Administered 2013-01-14 – 2013-01-17 (×4): 14 mg via TRANSDERMAL
  Filled 2013-01-14 (×4): qty 1

## 2013-01-14 MED ORDER — FENTANYL CITRATE 0.05 MG/ML IJ SOLN
INTRAMUSCULAR | Status: DC | PRN
  Administered 2013-01-14 (×2): 50 ug via INTRAVENOUS
  Administered 2013-01-14: 150 ug via INTRAVENOUS

## 2013-01-14 MED ORDER — LIDOCAINE HCL (CARDIAC) 20 MG/ML IV SOLN
INTRAVENOUS | Status: DC | PRN
  Administered 2013-01-14: 100 mg via INTRAVENOUS

## 2013-01-14 MED ORDER — LACTATED RINGERS IV SOLN
INTRAVENOUS | Status: DC | PRN
  Administered 2013-01-14 (×2): via INTRAVENOUS

## 2013-01-14 MED ORDER — LABETALOL HCL 5 MG/ML IV SOLN
INTRAVENOUS | Status: DC | PRN
  Administered 2013-01-14: 7.5 mg via INTRAVENOUS
  Administered 2013-01-14: 2.5 mg via INTRAVENOUS
  Administered 2013-01-14: 5 mg via INTRAVENOUS
  Administered 2013-01-14 (×2): 2.5 mg via INTRAVENOUS

## 2013-01-14 MED ORDER — ALBUTEROL SULFATE HFA 108 (90 BASE) MCG/ACT IN AERS
2.0000 | INHALATION_SPRAY | RESPIRATORY_TRACT | Status: DC | PRN
  Filled 2013-01-14: qty 6.7

## 2013-01-14 MED ORDER — TALC 5 G PL SUSR
INTRAPLEURAL | Status: AC
  Filled 2013-01-14: qty 10

## 2013-01-14 MED ORDER — DIPHENHYDRAMINE HCL 12.5 MG/5ML PO ELIX
12.5000 mg | ORAL_SOLUTION | Freq: Four times a day (QID) | ORAL | Status: DC | PRN
  Filled 2013-01-14: qty 5

## 2013-01-14 MED ORDER — TALC 5 G PL SUSR
INTRAPLEURAL | Status: DC | PRN
  Administered 2013-01-14: 5 g via INTRAPLEURAL

## 2013-01-14 MED ORDER — ONDANSETRON HCL 4 MG/2ML IJ SOLN
4.0000 mg | Freq: Four times a day (QID) | INTRAMUSCULAR | Status: DC | PRN
  Administered 2013-01-16: 4 mg via INTRAVENOUS
  Filled 2013-01-14: qty 2

## 2013-01-14 MED ORDER — HYDROMORPHONE HCL PF 1 MG/ML IJ SOLN
INTRAMUSCULAR | Status: DC | PRN
  Administered 2013-01-14 (×2): 0.5 mg via INTRAVENOUS

## 2013-01-14 MED ORDER — ACETAMINOPHEN 10 MG/ML IV SOLN
1000.0000 mg | Freq: Four times a day (QID) | INTRAVENOUS | Status: AC
  Administered 2013-01-14 – 2013-01-15 (×3): 1000 mg via INTRAVENOUS
  Filled 2013-01-14 (×3): qty 100

## 2013-01-14 MED ORDER — GLYCOPYRROLATE 0.2 MG/ML IJ SOLN
INTRAMUSCULAR | Status: DC | PRN
  Administered 2013-01-14: 0.4 mg via INTRAVENOUS

## 2013-01-14 MED ORDER — ONDANSETRON HCL 4 MG/2ML IJ SOLN: 4.0000 mg | Freq: Once | INTRAMUSCULAR | Status: DC | PRN

## 2013-01-14 MED ORDER — ARTIFICIAL TEARS OP OINT
TOPICAL_OINTMENT | OPHTHALMIC | Status: DC | PRN
  Administered 2013-01-14: 1 via OPHTHALMIC

## 2013-01-14 MED ORDER — NALOXONE HCL 0.4 MG/ML IJ SOLN: 0.4000 mg | INTRAMUSCULAR | Status: DC | PRN

## 2013-01-14 MED ORDER — BISACODYL 5 MG PO TBEC
10.0000 mg | DELAYED_RELEASE_TABLET | Freq: Every day | ORAL | Status: DC
  Administered 2013-01-15 – 2013-01-17 (×2): 10 mg via ORAL
  Filled 2013-01-14 (×3): qty 2

## 2013-01-14 MED ORDER — PROPOFOL 10 MG/ML IV BOLUS
INTRAVENOUS | Status: DC | PRN
  Administered 2013-01-14: 50 mg via INTRAVENOUS
  Administered 2013-01-14: 150 mg via INTRAVENOUS

## 2013-01-14 MED ORDER — ENSURE COMPLETE PO LIQD
237.0000 mL | Freq: Three times a day (TID) | ORAL | Status: DC
  Administered 2013-01-14 – 2013-01-17 (×8): 237 mL via ORAL

## 2013-01-14 MED ORDER — POTASSIUM CHLORIDE 10 MEQ/50ML IV SOLN
10.0000 meq | Freq: Every day | INTRAVENOUS | Status: DC | PRN
  Filled 2013-01-14: qty 100

## 2013-01-14 MED ORDER — SODIUM CHLORIDE 0.9 % IJ SOLN: 9.0000 mL | INTRAMUSCULAR | Status: DC | PRN

## 2013-01-14 SURGICAL SUPPLY — 78 items
APPLICATOR TIP COSEAL (VASCULAR PRODUCTS) IMPLANT
APPLICATOR TIP EXT COSEAL (VASCULAR PRODUCTS) IMPLANT
CANISTER SUCTION 2500CC (MISCELLANEOUS) ×6 IMPLANT
CATH KIT ON Q 5IN SLV (PAIN MANAGEMENT) IMPLANT
CATH ROBINSON RED A/P 22FR (CATHETERS) ×3 IMPLANT
CATH THORACIC 28FR (CATHETERS) IMPLANT
CATH THORACIC 36FR (CATHETERS) ×3 IMPLANT
CATH THORACIC 36FR RT ANG (CATHETERS) ×3 IMPLANT
CLIP TI MEDIUM 24 (CLIP) IMPLANT
CLOTH BEACON ORANGE TIMEOUT ST (SAFETY) ×3 IMPLANT
CONT SPEC 4OZ CLIKSEAL STRL BL (MISCELLANEOUS) ×12 IMPLANT
COVER SURGICAL LIGHT HANDLE (MISCELLANEOUS) ×3 IMPLANT
DERMABOND ADVANCED (GAUZE/BANDAGES/DRESSINGS)
DERMABOND ADVANCED .7 DNX12 (GAUZE/BANDAGES/DRESSINGS) IMPLANT
DRAPE C-ARM 42X72 X-RAY (DRAPES) ×3 IMPLANT
DRAPE LAPAROSCOPIC ABDOMINAL (DRAPES) ×3 IMPLANT
DRAPE WARM FLUID 44X44 (DRAPE) ×3 IMPLANT
ELECT BLADE 4.0 EZ CLEAN MEGAD (MISCELLANEOUS) ×3
ELECT REM PT RETURN 9FT ADLT (ELECTROSURGICAL) ×3
ELECTRODE BLDE 4.0 EZ CLN MEGD (MISCELLANEOUS) ×1 IMPLANT
ELECTRODE REM PT RTRN 9FT ADLT (ELECTROSURGICAL) ×1 IMPLANT
GLOVE BIO SURGEON STRL SZ 6 (GLOVE) ×3 IMPLANT
GLOVE BIO SURGEON STRL SZ7.5 (GLOVE) IMPLANT
GLOVE BIOGEL PI IND STRL 6 (GLOVE) ×1 IMPLANT
GLOVE BIOGEL PI IND STRL 7.0 (GLOVE) ×2 IMPLANT
GLOVE BIOGEL PI INDICATOR 6 (GLOVE) ×2
GLOVE BIOGEL PI INDICATOR 7.0 (GLOVE) ×4
GLOVE ORTHO TXT STRL SZ7.5 (GLOVE) ×12 IMPLANT
GOWN PREVENTION PLUS XLARGE (GOWN DISPOSABLE) ×3 IMPLANT
GOWN STRL NON-REIN LRG LVL3 (GOWN DISPOSABLE) ×12 IMPLANT
KIT BASIN OR (CUSTOM PROCEDURE TRAY) ×3 IMPLANT
KIT PLEURX DRAIN CATH 1000ML (MISCELLANEOUS) IMPLANT
KIT PLEURX DRAIN CATH 15.5FR (DRAIN) ×3 IMPLANT
KIT ROOM TURNOVER OR (KITS) ×3 IMPLANT
KIT SUCTION CATH 14FR (SUCTIONS) IMPLANT
NS IRRIG 1000ML POUR BTL (IV SOLUTION) ×6 IMPLANT
PACK CHEST (CUSTOM PROCEDURE TRAY) ×3 IMPLANT
PACK GENERAL/GYN (CUSTOM PROCEDURE TRAY) IMPLANT
PAD ARMBOARD 7.5X6 YLW CONV (MISCELLANEOUS) ×6 IMPLANT
SEALANT PROGEL (MISCELLANEOUS) IMPLANT
SEALANT SURG COSEAL 4ML (VASCULAR PRODUCTS) IMPLANT
SEALANT SURG COSEAL 8ML (VASCULAR PRODUCTS) IMPLANT
SET DRAINAGE LINE (MISCELLANEOUS) IMPLANT
SOLUTION ANTI FOG 6CC (MISCELLANEOUS) ×3 IMPLANT
SPONGE GAUZE 4X4 12PLY (GAUZE/BANDAGES/DRESSINGS) ×3 IMPLANT
SPONGE TONSIL 1.25 RF SGL STRG (GAUZE/BANDAGES/DRESSINGS) ×6 IMPLANT
SUT CHROMIC 3 0 SH 27 (SUTURE) IMPLANT
SUT ETHILON 3 0 FSL (SUTURE) ×3 IMPLANT
SUT ETHILON 3 0 PS 1 (SUTURE) IMPLANT
SUT PROLENE 3 0 SH DA (SUTURE) IMPLANT
SUT PROLENE 4 0 RB 1 (SUTURE)
SUT PROLENE 4-0 RB1 .5 CRCL 36 (SUTURE) IMPLANT
SUT SILK  1 MH (SUTURE) ×6
SUT SILK 1 MH (SUTURE) ×3 IMPLANT
SUT SILK 2 0 SH (SUTURE) IMPLANT
SUT SILK 2 0SH CR/8 30 (SUTURE) IMPLANT
SUT SILK 3 0SH CR/8 30 (SUTURE) IMPLANT
SUT VIC AB 1 CTX 18 (SUTURE) ×3 IMPLANT
SUT VIC AB 1 CTX 36 (SUTURE)
SUT VIC AB 1 CTX36XBRD ANBCTR (SUTURE) IMPLANT
SUT VIC AB 2-0 CTX 36 (SUTURE) ×3 IMPLANT
SUT VIC AB 2-0 UR6 27 (SUTURE) IMPLANT
SUT VIC AB 3-0 X1 27 (SUTURE) ×3 IMPLANT
SUT VICRYL 2 TP 1 (SUTURE) ×3 IMPLANT
SWAB COLLECTION DEVICE MRSA (MISCELLANEOUS) IMPLANT
SYR BULB IRRIGATION 50ML (SYRINGE) ×3 IMPLANT
SYR CONTROL 10ML LL (SYRINGE) IMPLANT
SYSTEM SAHARA CHEST DRAIN ATS (WOUND CARE) ×3 IMPLANT
TAPE CLOTH 4X10 WHT NS (GAUZE/BANDAGES/DRESSINGS) ×3 IMPLANT
TAPE CLOTH SURG 4X10 WHT LF (GAUZE/BANDAGES/DRESSINGS) ×3 IMPLANT
TIP APPLICATOR SPRAY EXTEND 16 (VASCULAR PRODUCTS) IMPLANT
TOWEL OR 17X24 6PK STRL BLUE (TOWEL DISPOSABLE) ×3 IMPLANT
TOWEL OR 17X26 10 PK STRL BLUE (TOWEL DISPOSABLE) ×3 IMPLANT
TRAP SPECIMEN MUCOUS 40CC (MISCELLANEOUS) IMPLANT
TRAY FOLEY CATH 14FRSI W/METER (CATHETERS) ×3 IMPLANT
TUBE ANAEROBIC SPECIMEN COL (MISCELLANEOUS) IMPLANT
WATER STERILE IRR 1000ML POUR (IV SOLUTION) ×3 IMPLANT
YANKAUER SUCT BULB TIP NO VENT (SUCTIONS) ×3 IMPLANT

## 2013-01-14 NOTE — Progress Notes (Signed)
Dr. Donata Clay visited at bedside

## 2013-01-14 NOTE — Progress Notes (Signed)
Dr, Lyndee Hensen called with gases O2 st 59.5  Orders to increase cpap to 16 and obtain a gas at 1330. And to not give the patient the PCA button

## 2013-01-14 NOTE — Progress Notes (Signed)
The patient was examined and preop studies reviewed. There has been no change from the prior exam  planR VATS on Geoffrey Richardson today

## 2013-01-14 NOTE — Progress Notes (Signed)
Utilization Review Completed.Shyniece Scripter T6/01/2013  

## 2013-01-14 NOTE — Progress Notes (Signed)
Pt taken of Bipap and placed on 3L [place]. Pt is much more awake and alert.

## 2013-01-14 NOTE — Anesthesia Procedure Notes (Signed)
Procedure Name: Intubation Date/Time: 01/14/2013 7:51 AM Performed by: Tyrone Nine Pre-anesthesia Checklist: Patient identified, Timeout performed, Emergency Drugs available, Suction available and Patient being monitored Patient Re-evaluated:Patient Re-evaluated prior to inductionOxygen Delivery Method: Circle system utilized Preoxygenation: Pre-oxygenation with 100% oxygen Intubation Type: IV induction Ventilation: Mask ventilation without difficulty and Oral airway inserted - appropriate to patient size Laryngoscope Size: Mac and 3 Grade View: Grade I Tube type: Oral Endobronchial tube: Double lumen EBT, EBT position confirmed by auscultation and EBT position confirmed by fiberoptic bronchoscope and 41 Fr Number of attempts: 1 Airway Equipment and Method: Fiberoptic brochoscope and Oral airway Placement Confirmation: ETT inserted through vocal cords under direct vision,  positive ETCO2,  CO2 detector and breath sounds checked- equal and bilateral Secured at: 39 cm Tube secured with: Tape Dental Injury: Teeth and Oropharynx as per pre-operative assessment

## 2013-01-14 NOTE — Anesthesia Preprocedure Evaluation (Addendum)
Anesthesia Evaluation  Patient identified by MRN, date of birth, ID band Patient awake    Reviewed: Allergy & Precautions, H&P , NPO status , Patient's Chart, lab work & pertinent test results  Airway Mallampati: II TM Distance: >3 FB     Dental  (+) Teeth Intact and Dental Advisory Given   Pulmonary shortness of breath and at rest, COPDCurrent Smoker,  01-12-13 Chest X-ray  IMPRESSION: Persistent large right pleural effusion with overlying atelectasis. Underlying emphysematous changes. The left lung remains clear.       Pulmonary exam normal       Cardiovascular Exercise Tolerance: Poor hypertension, Pt. on medications Rhythm:Regular Rate:Normal     Neuro/Psych  Headaches, negative psych ROS   GI/Hepatic GERD-  Medicated,  Endo/Other  Hypothyroidism   Renal/GU      Musculoskeletal negative musculoskeletal ROS (+)   Abdominal Normal abdominal exam  (+)   Peds  Hematology negative hematology ROS (+) Blood dyscrasia, ,   Anesthesia Other Findings Overbite  Reproductive/Obstetrics negative OB ROS                         Anesthesia Physical Anesthesia Plan  ASA: II  Anesthesia Plan: General   Post-op Pain Management:    Induction: Intravenous  Airway Management Planned: Double Lumen EBT  Additional Equipment: Arterial line and CVP  Intra-op Plan:   Post-operative Plan: Extubation in OR  Informed Consent: I have reviewed the patients History and Physical, chart, labs and discussed the procedure including the risks, benefits and alternatives for the proposed anesthesia with the patient or authorized representative who has indicated his/her understanding and acceptance.   Dental advisory given  Plan Discussed with: CRNA and Anesthesiologist  Anesthesia Plan Comments:         Anesthesia Quick Evaluation

## 2013-01-14 NOTE — Progress Notes (Signed)
Dr Ossey at bedside 

## 2013-01-14 NOTE — Anesthesia Postprocedure Evaluation (Signed)
Anesthesia Post Note  Patient: Geoffrey Richardson  Procedure(s) Performed: Procedure(s) (LRB): VIDEO ASSISTED THORACOSCOPY (VATS) W/TALC PLEUADESIS (Right) INSERTION PLEURAL DRAINAGE CATHETER (Right)  Anesthesia type: general  Patient location: PACU  Post pain: Pain level controlled  Post assessment: Patient's Cardiovascular Status Stable  Last Vitals:  Filed Vitals:   01/14/13 1215  BP: 135/94  Pulse: 89  Temp: 36.7 C  Resp: 10    Post vital signs: Reviewed and stable  Level of consciousness: sedated  Complications: No apparent anesthesia complications

## 2013-01-14 NOTE — Progress Notes (Signed)
Encouraged deep breathes  Nurse at bedside

## 2013-01-14 NOTE — Brief Op Note (Signed)
01/14/2013  9:54 AM  PATIENT:  Geoffrey Richardson  52 y.o. male  PRE-OPERATIVE DIAGNOSIS:  (R)MALIGNANT PLEURAL EFFUSION  POST-OPERATIVE DIAGNOSIS:  (R)MALIGNANT PLEURAL EFFUSION  PROCEDURE:  Procedure(s):  VIDEO ASSISTED THORACOSCOPY/MINI THORACOTOMY -Drainage of pleural effusion. -Talc Pleurodesis  INSERTION PLEURAL DRAINAGE CATHETER (Right)  SURGEON:  Surgeon(s) and Role:    * Kerin Perna, MD - Primary  PHYSICIAN ASSISTANT: Lowella Dandy PA-C  ANESTHESIA:   general  EBL:  Total I/O In: 1000 [I.V.:1000] Out: 200 [Urine:200]  BLOOD ADMINISTERED:none  DRAINS: 36 Angle Chest tube Right Chest   LOCAL MEDICATIONS USED:  NONE  SPECIMEN:  Source of Specimen:  RIght Pleura  DISPOSITION OF SPECIMEN:  PATHOLOGY  COUNTS:  YES  TOURNIQUET:  * No tourniquets in log *  DICTATION: .Dragon Dictation  PLAN OF CARE: Admit to inpatient   PATIENT DISPOSITION:  ICU - intubated and hemodynamically stable.   Delay start of Pharmacological VTE agent (>24hrs) due to surgical blood loss or risk of bleeding: yes

## 2013-01-14 NOTE — Progress Notes (Signed)
T. CTS p.m. Rounds  Patient resting comfortably after right VATS and drainage of ligament effusion with talc pleurodesis Pain control with PCA

## 2013-01-14 NOTE — Progress Notes (Signed)
Chest xray done.

## 2013-01-14 NOTE — Preoperative (Signed)
Beta Blockers   Reason not to administer Beta Blockers:Not Applicable 

## 2013-01-14 NOTE — Progress Notes (Signed)
Pt placed on c-pap 12/6 fio2 50%

## 2013-01-14 NOTE — Progress Notes (Signed)
DR. Donata Clay at bedside

## 2013-01-14 NOTE — Transfer of Care (Signed)
Immediate Anesthesia Transfer of Care Note  Patient: Geoffrey Richardson  Procedure(s) Performed: Procedure(s): VIDEO ASSISTED THORACOSCOPY (VATS) W/TALC PLEUADESIS (Right) INSERTION PLEURAL DRAINAGE CATHETER (Right)  Patient Location: PACU  Anesthesia Type:General  Level of Consciousness: awake, sedated and patient cooperative  Airway & Oxygen Therapy: Patient Spontanous Breathing and Patient connected to face mask oxygen  Post-op Assessment: Report given to PACU RN and Post -op Vital signs reviewed and stable  Post vital signs: Reviewed and stable  Complications: No apparent anesthesia complications

## 2013-01-14 NOTE — Progress Notes (Signed)
Dr. Michelle Piper at bedside repeat ABG drawn

## 2013-01-14 NOTE — Progress Notes (Signed)
DR. Donata Clay called abg results orders for c -pap, respiratory called

## 2013-01-15 ENCOUNTER — Inpatient Hospital Stay (HOSPITAL_COMMUNITY): Payer: Medicaid Other

## 2013-01-15 LAB — BASIC METABOLIC PANEL
CO2: 30 mEq/L (ref 19–32)
Calcium: 8.8 mg/dL (ref 8.4–10.5)
Chloride: 99 mEq/L (ref 96–112)
Potassium: 3.9 mEq/L (ref 3.5–5.1)
Sodium: 136 mEq/L (ref 135–145)

## 2013-01-15 LAB — CBC
Hemoglobin: 13.6 g/dL (ref 13.0–17.0)
Platelets: 239 10*3/uL (ref 150–400)
RBC: 4.87 MIL/uL (ref 4.22–5.81)
WBC: 10.3 10*3/uL (ref 4.0–10.5)

## 2013-01-15 LAB — POCT I-STAT 3, ART BLOOD GAS (G3+)
Acid-Base Excess: 6 mmol/L — ABNORMAL HIGH (ref 0.0–2.0)
Bicarbonate: 31.9 mEq/L — ABNORMAL HIGH (ref 20.0–24.0)
O2 Saturation: 95 %
Patient temperature: 98.3
TCO2: 33 mmol/L (ref 0–100)
pCO2 arterial: 50.3 mmHg — ABNORMAL HIGH (ref 35.0–45.0)
pH, Arterial: 7.41 (ref 7.350–7.450)
pO2, Arterial: 77 mmHg — ABNORMAL LOW (ref 80.0–100.0)

## 2013-01-15 MED ORDER — FUROSEMIDE 10 MG/ML IJ SOLN
INTRAMUSCULAR | Status: AC
  Administered 2013-01-15: 20 mg via INTRAVENOUS
  Filled 2013-01-15: qty 4

## 2013-01-15 MED ORDER — BUDESONIDE-FORMOTEROL FUMARATE 160-4.5 MCG/ACT IN AERO
2.0000 | INHALATION_SPRAY | Freq: Two times a day (BID) | RESPIRATORY_TRACT | Status: DC
  Administered 2013-01-15 – 2013-01-17 (×5): 2 via RESPIRATORY_TRACT
  Filled 2013-01-15: qty 6

## 2013-01-15 MED ORDER — ENOXAPARIN SODIUM 30 MG/0.3ML ~~LOC~~ SOLN
40.0000 mg | SUBCUTANEOUS | Status: DC
  Administered 2013-01-15 – 2013-01-16 (×2): 40 mg via SUBCUTANEOUS
  Filled 2013-01-15 (×3): qty 0.4

## 2013-01-15 MED ORDER — FUROSEMIDE 10 MG/ML IJ SOLN
20.0000 mg | Freq: Two times a day (BID) | INTRAMUSCULAR | Status: DC
  Administered 2013-01-15 – 2013-01-17 (×4): 20 mg via INTRAVENOUS
  Filled 2013-01-15 (×4): qty 2

## 2013-01-15 MED ORDER — METOPROLOL TARTRATE 25 MG PO TABS
25.0000 mg | ORAL_TABLET | Freq: Two times a day (BID) | ORAL | Status: DC
  Administered 2013-01-15 – 2013-01-17 (×5): 25 mg via ORAL
  Filled 2013-01-15 (×6): qty 1

## 2013-01-15 MED ORDER — POTASSIUM CHLORIDE 10 MEQ/50ML IV SOLN
10.0000 meq | INTRAVENOUS | Status: AC
  Administered 2013-01-15 (×2): 10 meq via INTRAVENOUS

## 2013-01-15 MED ORDER — FUROSEMIDE 10 MG/ML IJ SOLN
INTRAMUSCULAR | Status: AC
  Filled 2013-01-15: qty 2

## 2013-01-15 NOTE — Op Note (Signed)
NAMEWILMAN, Geoffrey NO.:  [id number]  MEDICAL RECORD NO.:  [id number]  LOCATION:  2315                         FACILITY:  [facility]  PHYSICIAN:  Kerin Perna, M.D.  DATE OF BIRTH:  07/01/1961  DATE OF PROCEDURE:  01/14/2013 DATE OF DISCHARGE:                              OPERATIVE REPORT   OPERATION: 1. Right VATS (video-assisted thoracoscopic surgery) with drainage of     right malignant effusion, decortication right lower lobe. 2. Talc pleurodesis of the pleural space. 3. Placement of a right Pleurx catheter.  SURGEON:  Kerin Perna, M.D.  ASSISTANT:  Pauline Good PA-C.  PREOPERATIVE DIAGNOSIS:  Poorly differentiated non-small-cell carcinoma of the right lung with malignant pleural effusion and shortness of breath.  POSTOPERATIVE DIAGNOSIS:  Poorly differentiated non-small-cell carcinoma of the right lung with malignant pleural effusion and shortness of breath.  ANESTHESIA:  General.  INDICATIONS:  The patient is a 52 year old African American male with a history of smoking who presented with severe shortness of breath and was found have a large right pleural effusion, which was drained with thoracentesis.  After the second drainage procedure, he had recurrence. The cytology returned poorly differentiated non-small-cell carcinoma.  A PET scan had not yet been done, but the patient was discharged and had reaccumulated fluid and was getting short of breath.  Arrangements were made for admission for a drainage procedure with talc pleurodesis and placement of PleurX catheter as well for backup in case the effusion returned.  In office, I discussed the procedure of right VATS, drainage of effusion and talc pleurodesis and PleurX catheter placement with the patient and his wife.  They understood the indications, alternatives, and expected postoperative hospitalization.  They understood the risks of infection, recurrent effusion, and death.  He  agreed to proceed with surgery.  OPERATIVE FINDINGS: 1. Severely entrapped right lower lobe. 2. Obliterated right pleural space with multiple adhesions and pockets     of bloody pleural effusion. 3. Drainage of 1.8 L of bloody right pleural effusion.  OPERATIVE PROCEDURE:  The patient was brought to the operating room, placed supine on the operating table.  General anesthesia was induced. A proper time-out was performed.  A double-lumen endotracheal tube was then positioned.  The patient was turned to expose the right side up. He was prepped and draped as a sterile field.  A second time-out was performed.  First, a small incision was made at the tip of the scapula, and a VATS port was inserted.  However, visualization was completely obscured by dense pleural adhesions.  The port was removed with the scope.  A small incision was made anteriorly from the tip of the right scapula and the fifth interspace.  The ribs were gently spread, but not provided or broken.  The adhesions in the pleural space were taken down to allow complete drainage of the loculated right pleural effusion.  This was serosanguineous.  The right lower lobe was completely contracted and was very firm with a cobblestone appearance.  Some visceral pleural implants were debrided and submitted for pathology.  After the fusion had been adequately drained, 5 g of talc was then insufflated into the pleural space with  a red rubber catheter.  Next, a PleurX catheter was positioned through a small anterior incision and directed posteriorly in the right pleural space, along the paraspinal gutter.  This was secured to the skin.  Next, a 36-French angled chest tube was positioned above the right hemidiaphragm for postoperative drainage and secured to the skin and connected to a Pleur-Evac drainage system.  The rib was reapproximated with a single pericostal suture of #2 Vicryl. The muscle was closed in layers using  interrupted #1 Vicryl and the subcutaneous and skin layers were closed in running Vicryl.  The lung was re-expanded as best as possible, and then the patient was rolled to the supine position, extubated and then returned to the recovery room.     Kerin Perna, M.D.     PV/MEDQ  D:  01/14/2013  T:  01/15/2013  Job:  [id number]  cc:   Geoffrey Richardson, M.D.

## 2013-01-15 NOTE — Progress Notes (Signed)
1 Day Post-Op Procedure(s) (LRB): VIDEO ASSISTED THORACOSCOPY (VATS) W/TALC PLEUADESIS (Right) INSERTION PLEURAL DRAINAGE CATHETER (Right) Subjective: R VATS- drain malig effusion and talc pleurodesis pleurex Objective: Vital signs in last 24 hours: Temp:  [97 F (36.1 C)-98.5 F (36.9 C)] 98 F (36.7 C) (06/07 0732) Pulse Rate:  [84-111] 105 (06/07 0900) Cardiac Rhythm:  [-] Sinus tachycardia (06/07 0900) Resp:  [8-37] 19 (06/07 0900) BP: (112-151)/(66-108) 124/66 mmHg (06/07 0900) SpO2:  [92 %-100 %] 98 % (06/07 0900) Arterial Line BP: (99-180)/(69-102) 113/84 mmHg (06/07 0900) FiO2 (%):  [40 %-97 %] 40 % (06/06 1443) Weight:  [195 lb 15.8 oz (88.9 kg)] 195 lb 15.8 oz (88.9 kg) (06/07 0300)  Hemodynamic parameters for last 24 hours:  sinus tach  Intake/Output from previous day: 06/06 0701 - 06/07 0700 In: 3889.5 [P.O.:120; I.V.:3219.5; IV Piggyback:550] Out: 3605 [Urine:2380; Chest Tube:1225] Intake/Output this shift: Total I/O In: 170 [I.V.:120; IV Piggyback:50] Out: 550 [Urine:450; Chest Tube:100]  No airleak- serosang drainage  Lab Results:  Recent Labs  01/12/13 1404 01/15/13 0400  WBC 6.9 10.3  HGB 14.0 13.6  HCT 42.1 41.3  PLT 270 239   BMET:  Recent Labs  01/12/13 1404 01/15/13 0400  NA 138 136  K 3.9 3.9  CL 104 99  CO2 25 30  GLUCOSE 94 136*  BUN 12 7  CREATININE 0.58 0.54  CALCIUM 9.2 8.8    PT/INR:  Recent Labs  01/12/13 1404  LABPROT 13.7  INR 1.06   ABG    Component Value Date/Time   PHART 7.410 01/15/2013 0401   HCO3 31.9* 01/15/2013 0401   TCO2 33 01/15/2013 0401   O2SAT 95.0 01/15/2013 0401   CBG (last 3)  No results found for this basename: GLUCAP,  in the last 72 hours  Assessment/Plan: S/P Procedure(s) (LRB): VIDEO ASSISTED THORACOSCOPY (VATS) W/TALC PLEUADESIS (Right) INSERTION PLEURAL DRAINAGE CATHETER (Right) Plan for transfer to step-down: see transfer orders Chest tube to water seal Home pleurex arrangements  LOS:  1 day    VAN TRIGT Richardson,Geoffrey 01/15/2013

## 2013-01-15 NOTE — Progress Notes (Signed)
Wasted 25 cc fentanyl down sink and flushed. Witnessed by [facility] RN  Felipa Emory

## 2013-01-15 NOTE — Progress Notes (Signed)
Pt t/x to unit 3300 in wheelchair on monitor without event. Pt's sister aware of room change. Pauline RN on 3300 present during pt arrival and for telemetry hookup. No unanswered questions for bedside report. Pt's cellphone with him upon t/x  Felipa Emory

## 2013-01-16 ENCOUNTER — Inpatient Hospital Stay (HOSPITAL_COMMUNITY): Payer: Medicaid Other

## 2013-01-16 ENCOUNTER — Encounter (HOSPITAL_COMMUNITY): Payer: Self-pay | Admitting: *Deleted

## 2013-01-16 LAB — CBC
HCT: 40.6 % (ref 39.0–52.0)
Hemoglobin: 12.7 g/dL — ABNORMAL LOW (ref 13.0–17.0)
MCHC: 31.3 g/dL (ref 30.0–36.0)
MCV: 85.1 fL (ref 78.0–100.0)

## 2013-01-16 LAB — COMPREHENSIVE METABOLIC PANEL
Alkaline Phosphatase: 93 U/L (ref 39–117)
BUN: 13 mg/dL (ref 6–23)
Chloride: 98 mEq/L (ref 96–112)
GFR calc Af Amer: >90 mL/min (ref >90)
Glucose, Bld: 125 mg/dL — ABNORMAL HIGH (ref 70–99)
Potassium: 4 mEq/L (ref 3.5–5.1)
Total Bilirubin: 0.3 mg/dL (ref 0.3–1.2)
Total Protein: 6.9 g/dL (ref 6.0–8.3)

## 2013-01-16 MED ORDER — SORBITOL 70 % SOLN
30.0000 mL | Freq: Every day | Status: DC | PRN
  Filled 2013-01-16: qty 60

## 2013-01-16 MED ORDER — SODIUM CHLORIDE 0.9 % IJ SOLN
INTRAMUSCULAR | Status: AC
  Filled 2013-01-16: qty 10

## 2013-01-16 MED ORDER — FUROSEMIDE 10 MG/ML IJ SOLN
INTRAMUSCULAR | Status: AC
  Filled 2013-01-16: qty 4

## 2013-01-16 NOTE — Progress Notes (Addendum)
[facility] DAILY ICU PROGRESS NOTE                   [address]            [place] [address]          [phone]   2 Days Post-Op Procedure(s) (LRB): VIDEO ASSISTED THORACOSCOPY (VATS) W/TALC PLEUADESIS (Right) INSERTION PLEURAL DRAINAGE CATHETER (Right)  Total Length of Stay:  LOS: 2 days   Subjective: C/O pain from chest tube  Objective: Vital signs in last 24 hours: Temp:  [97.8 F (36.6 C)-99.1 F (37.3 C)] 98.1 F (36.7 C) (06/08 0746) Pulse Rate:  [95-111] 95 (06/08 0746) Cardiac Rhythm:  [-] Sinus tachycardia (06/08 0746) Resp:  [16-33] 17 (06/08 0746) BP: (101-154)/(66-121) 118/79 mmHg (06/08 0746) SpO2:  [89 %-99 %] 95 % (06/08 0817) Arterial Line BP: (113-122)/(84-91) 122/91 mmHg (06/07 1000)  Filed Weights   01/15/13 0300  Weight: 195 lb 15.8 oz (88.9 kg)    Weight change:    Hemodynamic parameters for last 24 hours:    Intake/Output from previous day: 06/07 0701 - 06/08 0700 In: 870 [P.O.:240; I.V.:480; IV Piggyback:150] Out: 5284 [XLKGM:0102; Chest Tube:250]  Intake/Output this shift: Total I/O In: 20 [I.V.:20] Out: -   Current Meds: Scheduled Meds: . bisacodyl  10 mg Oral Daily  . budesonide-formoterol  2 puff Inhalation BID  . enoxaparin  40 mg Subcutaneous Q24H  . feeding supplement  237 mL Oral TID WC  . furosemide  20 mg Intravenous BID  . levalbuterol  0.63 mg Nebulization Q6H  . metoprolol tartrate  25 mg Oral BID  . nicotine  14 mg Transdermal Daily  . pantoprazole  40 mg Oral QHS  . sodium chloride       Continuous Infusions: . dextrose 5 % and 0.45% NaCl 20 mL/hr (01/15/13 0954)   PRN Meds:.albuterol, ondansetron (ZOFRAN) IV, oxyCODONE-acetaminophen, potassium chloride, senna-docusate  General appearance: alert, cooperative and no distress Heart: regular rate and rhythm Lungs: dim right base Abdomen: soft, nontender Extremities: no edema Wound: dressings CDI  Lab Results: CBC: Recent Labs   01/15/13 0400 01/16/13 0600  WBC 10.3 9.2  HGB 13.6 12.7*  HCT 41.3 40.6  PLT 239 239   BMET:  Recent Labs  01/15/13 0400 01/16/13 0600  NA 136 135  K 3.9 4.0  CL 99 98  CO2 30 31  GLUCOSE 136* 125*  BUN 7 13  CREATININE 0.54 0.65  CALCIUM 8.8 8.6    PT/INR: No results found for this basename: LABPROT, INR,  in the last 72 hours Radiology: Dg Chest Port 1 View  01/16/2013   *RADIOLOGY REPORT*  Clinical Data: Evaluate chest tube placement.  PORTABLE CHEST - 1 VIEW  Comparison: Chest x-ray 01/15/2013.  Findings: Right-sided chest tube remains in position in the lower right hemithorax with tip medially and side port near the border of the bony thorax. There is a smaller bore right-sided chest tube is well extending vertically in the medial aspect of the right hemithorax, unchanged in position.  Again noted is apparent pneumothorax ex vacuo related to the evacuation of the patients previously noted chronic right-sided pleural effusion and failure to fully re-expand to be chronically collapsed right lower lobe. There may be a small amount of residual right-sided pleural fluid. Extensive architectural distortion throughout the right mid and lower lung is similar to prior study.  Left lung is clear.  Heart size is within normal limits.  Mediastinal contours are  distorted by patient positioning. There is a right-sided internal jugular central venous catheter with tip terminating in the mid to distal superior vena cava.  IMPRESSION: 1.  Allowing for slight differences in patient positioning, the radiographic appearance of the chest is essentially unchanged, as above.   Original Report Authenticated By: Trudie Reed, M.D.   Dg Chest Port 1 View  01/15/2013   *RADIOLOGY REPORT*  Clinical Data: Chest tube status post VATS  PORTABLE CHEST - 1 VIEW  Comparison: Prior chest x-ray 01/14/2013  Findings: Stable position of right IJ approach central venous catheter with the tip in the mid superior vena  cava. Right-sided thoracostomy tubes in unchanged position.  The larger thoracostomy tube proximal side hole is just within the thorax.  Stable appearance of the right lung base with incomplete re- expansion of the lung and persistent pleural thickening.  Mild interstitial edema appears improved.  Mild subsegmental atelectasis in the left lung base.  Unchanged cardiac and mediastinal contours. No acute osseous abnormality.  IMPRESSION:  1.  No significant interval change in the appearance of the right lung base with incomplete expansion of the right lower lobe this by the presence of a thoracostomy tubes. 2.  Mild left basilar subsegmental atelectasis 3.  Improved interstitial edema   Original Report Authenticated By: Malachy Moan, M.D.   Dg Chest Portable 1 View  01/14/2013   *RADIOLOGY REPORT*  Clinical Data: Status post right thoracotomy for malignant pleural effusion  PORTABLE CHEST - 1 VIEW  Comparison: 01/12/2013  Findings: Postoperatively, two drainage tubes are noted in the right pleural space with a component of basilar pneumothorax present from lack of complete re-expansion of the lung.  The component of the fluid has been removed and there is underlying atelectasis and consolidation of the right lower lung.  A central line is in place with the tip in the SVC.  No pulmonary edema is present.  Heart is mildly enlarged.  IMPRESSION:  1. Lack of full re-expansion of the right lung with component of the right lower basilar pneumothorax present. 2.  Central line tip in SVC.   Original Report Authenticated By: Irish Lack, M.D.   Chest tube- no air leak, 250 cc recorded yesterday    Assessment/Plan: S/P Procedure(s) (LRB): VIDEO ASSISTED THORACOSCOPY (VATS) W/TALC PLEUADESIS (Right) INSERTION PLEURAL DRAINAGE CATHETER (Right)  1 poss d/c chest tube soon and transition to pleurx 2 is on home O2 3 d/c pca  when tube out   GOLD,WAYNE E 01/16/2013 8:45 AM  Check path on bx tomorrow prob  home Tues

## 2013-01-16 NOTE — Progress Notes (Signed)
Chest tube, foley and R IJ were removed. Pt tolerated procedures well. Will continue to monitor.

## 2013-01-17 ENCOUNTER — Encounter (HOSPITAL_COMMUNITY): Payer: Self-pay | Admitting: Cardiothoracic Surgery

## 2013-01-17 ENCOUNTER — Inpatient Hospital Stay (HOSPITAL_COMMUNITY): Payer: Medicaid Other

## 2013-01-17 LAB — CBC
HCT: 39.2 % (ref 39.0–52.0)
Hemoglobin: 13 g/dL (ref 13.0–17.0)
MCH: 28.1 pg (ref 26.0–34.0)
MCHC: 33.2 g/dL (ref 30.0–36.0)
MCV: 84.7 fL (ref 78.0–100.0)
Platelets: 252 10*3/uL (ref 150–400)
RBC: 4.63 MIL/uL (ref 4.22–5.81)
RDW: 13.7 % (ref 11.5–15.5)
WBC: 7.4 10*3/uL (ref 4.0–10.5)

## 2013-01-17 LAB — BASIC METABOLIC PANEL
BUN: 17 mg/dL (ref 6–23)
CO2: 29 mEq/L (ref 19–32)
Calcium: 8.7 mg/dL (ref 8.4–10.5)
Chloride: 97 mEq/L (ref 96–112)
Creatinine, Ser: 0.7 mg/dL (ref 0.50–1.35)
GFR calc Af Amer: >90 mL/min (ref >90)
GFR calc non Af Amer: >90 mL/min (ref >90)
Glucose, Bld: 140 mg/dL — ABNORMAL HIGH (ref 70–99)
Potassium: 3.8 mEq/L (ref 3.5–5.1)
Sodium: 137 mEq/L (ref 135–145)

## 2013-01-17 MED ORDER — BUDESONIDE-FORMOTEROL FUMARATE 160-4.5 MCG/ACT IN AERO: 2.0000 | INHALATION_SPRAY | Freq: Two times a day (BID) | RESPIRATORY_TRACT | Status: DC

## 2013-01-17 MED ORDER — OXYCODONE HCL 5 MG PO TABS: 5.0000 mg | ORAL_TABLET | ORAL | Status: DC | PRN

## 2013-01-17 MED ORDER — TRAMADOL HCL 50 MG PO TABS
100.0000 mg | ORAL_TABLET | Freq: Once | ORAL | Status: AC
  Administered 2013-01-17: 100 mg via ORAL
  Filled 2013-01-17: qty 2

## 2013-01-17 MED ORDER — METOPROLOL TARTRATE 25 MG PO TABS: 25.0000 mg | ORAL_TABLET | Freq: Two times a day (BID) | ORAL | Status: DC

## 2013-01-17 NOTE — Discharge Summary (Signed)
[address]            [place] [address]          [phone]         Discharge Summary  Name: Geoffrey Richardson DOB: 05-28-1961 52 y.o. MRN: [id number]   Admission Date: 01/14/2013 Discharge Date: 01/17/2013    Admitting Diagnosis: Recurrent malignant pleural effusion   Discharge Diagnosis:  Recurrent malignant pleural effusion    Medication List    TAKE these medications       albuterol 108 (90 BASE) MCG/ACT inhaler  Commonly known as:  PROVENTIL HFA;VENTOLIN HFA  Inhale 2 puffs into the lungs every 4 (four) hours as needed for wheezing or shortness of breath.     budesonide-formoterol 160-4.5 MCG/ACT inhaler  Commonly known as:  SYMBICORT  Inhale 2 puffs into the lungs 2 (two) times daily.     feeding supplement Liqd  Take 237 mLs by mouth 3 (three) times daily with meals.     HYDROcodone-acetaminophen 5-325 MG per tablet  Commonly known as:  NORCO/VICODIN  Take 1-2 tablets by mouth every 6 (six) hours as needed for pain.     metoprolol tartrate 25 MG tablet  Commonly known as:  LOPRESSOR  Take 1 tablet (25 mg total) by mouth 2 (two) times daily.     nicotine 14 mg/24hr patch  Commonly known as:  NICODERM CQ - dosed in mg/24 hours  Place 1 patch onto the skin daily.     oxyCODONE 5 MG immediate release tablet  Commonly known as:  Oxy IR/ROXICODONE  Take 1 tablet (5 mg total) by mouth every 4 (four) hours as needed for pain.     pantoprazole 40 MG tablet  Commonly known as:  PROTONIX  Take 40 mg by mouth at bedtime.          Procedures: RIGHT VIDEO ASSISTED THORACOSCOPY - 01/14/2013  DRAINAGE OF MALIGNANT EFFUSION  TALC PLEURODESIS  INSERTION OF RIGHT PLEURX CATHETER    HPI:  The patient is a 52 y.o. male who was recently admitted with increasing shortness of breath and weight loss.  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   He had a re-accumulation of fluid and required a second thoracentesis within 3 days of the first procedure.  He improved and was discharged home.  He has continued to have shortness of breath, along with significant right sided chest pain. He was referred to Dr. Donata Clay for thoracic surgery evaluation for definitive management of his pleural effusion.  It was felt that he would benefit from VATS drainage and placement of a PleuRx catheter.  All risks, benefits and alternatives of surgery were explained in detail, and the patient agreed to proceed.     Hospital Course:  The patient was admitted to [facility] on 01/14/2013. The patient was taken to the operating room and underwent the above procedure.    The postoperative course has been generally uneventful.  His chest tube has been removed, and follow up chest x-rays have remained stable.  We will begin drainage of his PleuRx prior to discharge, and home health will be arranged to drain it on a Monday/Wednesday/Friday schedule. He has otherwise remained stable from a post-surgical standpoint, and is medically ready for discharge on today's date.      Recent vital signs:  Filed Vitals:   01/17/13 0748  BP: 114/78  Pulse: 93  Temp: 98 F (36.7 C)  Resp: 13    Recent laboratory studies:  CBC: Recent Labs  01/16/13 0600 01/17/13 0926  WBC 9.2 7.4  HGB 12.7* 13.0  HCT 40.6 39.2  PLT 239 252   BMET:  Recent Labs  01/16/13 0600 01/17/13 0926  NA 135 137  K 4.0 3.8  CL 98 97  CO2 31 29  GLUCOSE 125* 140*  BUN 13 17  CREATININE 0.65 0.70  CALCIUM 8.6 8.7    PT/INR: No results found for this basename: LABPROT, INR,  in the last 72 hours   Discharge Medications:     Medication List    TAKE these medications       albuterol 108 (90 BASE) MCG/ACT inhaler  Commonly known as:  PROVENTIL HFA;VENTOLIN HFA  Inhale 2 puffs into the lungs every 4 (four) hours as needed for wheezing or shortness of breath.      budesonide-formoterol 160-4.5 MCG/ACT inhaler  Commonly known as:  SYMBICORT  Inhale 2 puffs into the lungs 2 (two) times daily.     feeding supplement Liqd  Take 237 mLs by mouth 3 (three) times daily with meals.     HYDROcodone-acetaminophen 5-325 MG per tablet  Commonly known as:  NORCO/VICODIN  Take 1-2 tablets by mouth every 6 (six) hours as needed for pain.     metoprolol tartrate 25 MG tablet  Commonly known as:  LOPRESSOR  Take 1 tablet (25 mg total) by mouth 2 (two) times daily.     nicotine 14 mg/24hr patch  Commonly known as:  NICODERM CQ - dosed in mg/24 hours  Place 1 patch onto the skin daily.     oxyCODONE 5 MG immediate release tablet  Commonly known as:  Oxy IR/ROXICODONE  Take 1 tablet (5 mg total) by mouth every 4 (four) hours as needed for pain.     pantoprazole 40 MG tablet  Commonly known as:  PROTONIX  Take 40 mg by mouth at bedtime.         Discharge Instructions:  The patient is to refrain from driving, heavy lifting or strenuous activity.  May shower daily and clean incisions with soap and water.  May resume regular diet.  Home health RN will assist with PleuRx catheter drainage on Monday, Wednesday and Friday.   Follow Up: Follow-up Information   Follow up with VAN Dinah Beers, MD In 2 weeks. (Office will contact you with an appointment)    Contact information:   [address] [employer] [address] [place] [address] [phone]       Follow up with [facility], MD. (As directed)    Contact information:   [address] [place] [address] [phone]           Athaliah Baumbach H 01/17/2013, 12:12 PM

## 2013-01-17 NOTE — Care Management Note (Signed)
    Page 1 of 2   01/17/2013     11:23:02 AM   CARE MANAGEMENT NOTE 01/17/2013  Patient:  [facility]   Account Number:  [id number]  Date Initiated:  01/14/2013  Documentation initiated by:  [facility]  Subjective/Objective Assessment:   post op VATS, mini thoracotomy - drainage of effusion and talc pleuradesis.     Action/Plan:   Anticipated DC Date:  01/17/2013   Anticipated DC Plan:  HOME W HOME HEALTH SERVICES      DC Planning Services  CM consult      [facility] Choice  HOME HEALTH   Choice offered to / List presented to:  C-1 Patient        HH arranged  HH-1 RN      [facility] agency  [facility]   Status of service:  Completed, signed off Medicare Important Message given?   (If response is "NO", the following Medicare IM given date fields will be blank) Date Medicare IM given:   Date Additional Medicare IM given:    Discharge Disposition:  HOME W HOME HEALTH SERVICES  Per UR Regulation:  Reviewed for med. necessity/level of care/duration of stay  If discussed at Long Length of Stay Meetings, dates discussed:    Comments:  Contact:  Milana Kidney [phone]                Clark,Rita Sister [phone]  01/17/13- 1100- Donn Pierini RN, BSN [phone] Pt for d/c today, referral for [facility] for Pleurx cath drainage M/W/F- spoke with pt at bedside along with friend Loretta Plume (with permission)-regarding HH arrangements- pt states that he already has home 02 with [facility] and is ok with using [facility] for [facility] services too- discussed that referral would be made to [facility] for HH-RN to come assist with pleurx cath drainage that has been ordered for M/W/F - pt agreeable to this- pt has box of drainage kits in room to take home and paperwork filled out and faxed to [facility] for further supplies. Referral called to Debbie with [facility] for HH-RN- services to begin on 01/19/13-  CSW to speak with pt regarding questions about Medicaid and transportation needs to [facility].

## 2013-01-17 NOTE — Progress Notes (Signed)
Drained 350cc of sangouineous fluid from pleurex. Pt tolerated fairly well procedure.

## 2013-01-17 NOTE — Discharge Summary (Signed)
patient examined and medical record reviewed,agree with above note. Geoffrey Richardson,Geoffrey Richardson 01/17/2013

## 2013-01-17 NOTE — Progress Notes (Signed)
Clinical Child psychotherapist (CSW) informed that pt spouse had questions regarding transportation services available for pt once he begins chemotherapy at the [facility]. CSW spoke with pt spouse and confirmed that she will be able to provide transportation to pt first appointment on Wednesday however may not be able to for the following appointments. CSW confirmed that pt has applied for Medicaid (can qualify for Medicaid transportation once Medicaid is approved.) CSW informed spouse that there are certain resources pt should qualify for as a Cancer pt and can therefore follow up with the CSW at the Cancer Center at his first appointment. CSW has left a message for the Cancer Center CSW informing her of pt potential need for transportation resources.  No additional concerns CSW is signing off.  Theresia Bough, MSW, Theresia Majors [phone]

## 2013-01-17 NOTE — Progress Notes (Signed)
Pt being discharged to home with wife and children via w/c. Discharged instructions discussed and explained to wife and pt., f/u appointment, prescriptions, inhaler given to pt., pleurex drainage kit sent home with family. Ultram given for pain prior to discharge.

## 2013-01-17 NOTE — Progress Notes (Signed)
                    [address]            [place] [address]          [phone]     3 Days Post-Op Procedure(s) (LRB): VIDEO ASSISTED THORACOSCOPY (VATS) W/TALC PLEUADESIS (Right) INSERTION PLEURAL DRAINAGE CATHETER (Right)  Subjective: Feels well, ready to go home.   Objective: Vital signs in last 24 hours: Patient Vitals for the past 24 hrs:  BP Temp Temp src Pulse Resp SpO2  01/17/13 0748 - 98 F (36.7 C) Oral - - -  01/17/13 0320 127/86 mmHg 98.5 F (36.9 C) Oral 102 19 96 %  01/17/13 0146 - - - - - 99 %  01/16/13 2311 109/70 mmHg 98.3 F (36.8 C) Oral 97 18 96 %  01/16/13 1950 - - - - - 95 %  01/16/13 1949 - - - - - 95 %  01/16/13 1931 105/68 mmHg 97.9 F (36.6 C) Oral 99 19 96 %  01/16/13 1315 120/69 mmHg 98.4 F (36.9 C) Oral 91 18 98 %  01/16/13 0817 - - - - - 95 %  01/16/13 0816 - - - - - 95 %   Current Weight  01/15/13 195 lb 15.8 oz (88.9 kg)     Intake/Output from previous day: 06/08 0701 - 06/09 0700 In: 700 [P.O.:600; I.V.:100] Out: -     PHYSICAL EXAM:  Heart: RRR Lungs: Clear, slightly decreased BS on R Wound: Dressed and dry     Lab Results: CBC: Recent Labs  01/15/13 0400 01/16/13 0600  WBC 10.3 9.2  HGB 13.6 12.7*  HCT 41.3 40.6  PLT 239 239   BMET:  Recent Labs  01/15/13 0400 01/16/13 0600  NA 136 135  K 3.9 4.0  CL 99 98  CO2 30 31  GLUCOSE 136* 125*  BUN 7 13  CREATININE 0.54 0.65  CALCIUM 8.8 8.6    PT/INR: No results found for this basename: LABPROT, INR,  in the last 72 hours    Assessment/Plan: S/P Procedure(s) (LRB): VIDEO ASSISTED THORACOSCOPY (VATS) W/TALC PLEUADESIS (Right) INSERTION PLEURAL DRAINAGE CATHETER (Right) Will check CXR this am. Plan d/c home later today with [facility] for PleuRx management. Pt already has home O2 arranged.   LOS: 3 days    Ramsey Guadamuz H 01/17/2013

## 2013-01-18 ENCOUNTER — Other Ambulatory Visit: Payer: Self-pay | Admitting: Oncology

## 2013-01-18 DIAGNOSIS — J90 Pleural effusion, not elsewhere classified: Secondary | ICD-10-CM

## 2013-01-19 ENCOUNTER — Encounter: Payer: Self-pay | Admitting: Oncology

## 2013-01-19 ENCOUNTER — Telehealth: Payer: Self-pay | Admitting: Oncology

## 2013-01-19 ENCOUNTER — Other Ambulatory Visit (HOSPITAL_BASED_OUTPATIENT_CLINIC_OR_DEPARTMENT_OTHER): Payer: Medicaid Other | Admitting: Lab

## 2013-01-19 ENCOUNTER — Ambulatory Visit (HOSPITAL_BASED_OUTPATIENT_CLINIC_OR_DEPARTMENT_OTHER): Payer: Medicaid Other | Admitting: Oncology

## 2013-01-19 ENCOUNTER — Ambulatory Visit: Payer: Self-pay | Admitting: Cardiothoracic Surgery

## 2013-01-19 ENCOUNTER — Ambulatory Visit: Payer: Medicaid Other

## 2013-01-19 VITALS — BP 126/86 | HR 109 | Temp 98.4°F | Resp 18 | Ht 70.0 in | Wt 196.9 lb

## 2013-01-19 DIAGNOSIS — J910 Malignant pleural effusion: Secondary | ICD-10-CM

## 2013-01-19 DIAGNOSIS — J90 Pleural effusion, not elsewhere classified: Secondary | ICD-10-CM

## 2013-01-19 DIAGNOSIS — I771 Stricture of artery: Secondary | ICD-10-CM

## 2013-01-19 LAB — CBC WITH DIFFERENTIAL/PLATELET
BASO%: 0.6 % (ref 0.0–2.0)
EOS%: 0.5 % (ref 0.0–7.0)
MCH: 27.3 pg (ref 27.2–33.4)
MCHC: 32.8 g/dL (ref 32.0–36.0)
MCV: 83.4 fL (ref 79.3–98.0)
MONO%: 9.8 % (ref 0.0–14.0)
RBC: 4.47 10*6/uL (ref 4.20–5.82)
RDW: 14.2 % (ref 11.0–14.6)
lymph#: 1.2 10*3/uL (ref 0.9–3.3)

## 2013-01-19 LAB — COMPREHENSIVE METABOLIC PANEL (CC13)
ALT: 36 U/L (ref 0–55)
AST: 26 U/L (ref 5–34)
Albumin: 2.1 g/dL — ABNORMAL LOW (ref 3.5–5.0)
Alkaline Phosphatase: 102 U/L (ref 40–150)
BUN: 14.8 mg/dL (ref 7.0–26.0)
Chloride: 103 mEq/L (ref 98–107)
Potassium: 3.6 mEq/L (ref 3.5–5.1)
Sodium: 140 mEq/L (ref 136–145)

## 2013-01-19 MED ORDER — HYDROCODONE-ACETAMINOPHEN 5-325 MG PO TABS: 1.0000 | ORAL_TABLET | Freq: Four times a day (QID) | ORAL | Status: DC | PRN

## 2013-01-19 NOTE — Progress Notes (Signed)
Checked in patient for hospital follow up. Didn't have insurance cards on him but does have medicaid. No financial issues. Did not ask about POA/living will.

## 2013-01-19 NOTE — Progress Notes (Signed)
Hematology and Oncology Follow Up Visit  Geoffrey Richardson [id number] 05/24/1961 52 y.o. 01/19/2013 10:50 AM Allyse Fregeau, MDNo ref. provider found   Principle Diagnosis: 52 year old gentleman with diagnosis of malignant pleural effusion noted in May of 2014. At that time he presented with weakness, right chest pain and found to have pleural effusion. The cytology is suggesting malignant effusion possibly non-small cell cancer.   Prior Therapy: Patient is status post pleural catheter insertion that was done on 01/15/2013.  Current therapy: Under consideration to start systemic therapy.  Interim History: Geoffrey Richardson is a pleasant 52 year old gentleman I saw in consultation in the hospital on 12/28/2012. As mentioned, he presented with malignant pleural effusion status post thoracentesis with the cytology revealing non-small cell cancer histology. His CT scan images of the chest abdomen and pelvis did not reveal any clear-cut primary tumor although he did have esophageal focal marrow weighing. Since his recent discharge, he has been on relatively fair he still oxygen dependent and has limited mobility. His pain is under better control with Vicodin and oxycodone. He is eating better but does report some decline in his appetite overall.  Medications: I have reviewed the patient's current medications.  Current Outpatient Prescriptions  Medication Sig Dispense Refill  . albuterol (PROVENTIL HFA;VENTOLIN HFA) 108 (90 BASE) MCG/ACT inhaler Inhale 2 puffs into the lungs every 4 (four) hours as needed for wheezing or shortness of breath.  1 Inhaler  1  . budesonide-formoterol (SYMBICORT) 160-4.5 MCG/ACT inhaler Inhale 2 puffs into the lungs 2 (two) times daily.  1 Inhaler  0  . feeding supplement (ENSURE COMPLETE) LIQD Take 237 mLs by mouth 3 (three) times daily with meals.      Marland Kitchen HYDROcodone-acetaminophen (NORCO/VICODIN) 5-325 MG per tablet Take 1-2 tablets by mouth every 6 (six) hours as needed for pain.  30  tablet  0  . metoprolol tartrate (LOPRESSOR) 25 MG tablet Take 1 tablet (25 mg total) by mouth 2 (two) times daily.  60 tablet  1  . nicotine (NICODERM CQ - DOSED IN MG/24 HOURS) 14 mg/24hr patch Place 1 patch onto the skin daily.  10 patch  0  . pantoprazole (PROTONIX) 40 MG tablet Take 40 mg by mouth at bedtime.       No current facility-administered medications for this visit.     Allergies: No Known Allergies  Past Medical History, Surgical history, Social history, and Family History were reviewed and updated.  Review of Systems: Constitutional:  Negative for fever, chills, night sweats, anorexia, weight loss, pain. Cardiovascular: no chest pain or dyspnea on exertion Respiratory: no cough, shortness of breath, or wheezing Neurological: negative Dermatological: negative ENT: negative Skin: Negative. Gastrointestinal: no abdominal pain, change in bowel habits, or black or bloody stools Genito-Urinary: negative Hematological and Lymphatic: negative Breast: negative Musculoskeletal: negative Remaining ROS negative. Physical Exam: Blood pressure 126/86, pulse 109, temperature 98.4 F (36.9 C), temperature source Oral, resp. rate 18, height 5\' 10"  (1.778 m), weight 196 lb 14.4 oz (89.313 kg). ECOG: 2 General appearance: alert Head: Normocephalic, without obvious abnormality, atraumatic Neck: no adenopathy, no carotid bruit, no JVD, supple, symmetrical, trachea midline and thyroid not enlarged, symmetric, no tenderness/mass/nodules Lymph nodes: Cervical, supraclavicular, and axillary nodes normal. Heart:regular rate and rhythm, S1, S2 normal, no murmur, click, rub or gallop Lung:chest clear, no wheezing, rales, normal symmetric air entry. He has decreased breath sounds on the right. Abdomin: soft, non-tender, without masses or organomegaly EXT:no erythema, induration, or nodules   Lab Results: Lab Results  Component  Value Date   WBC 8.5 01/19/2013   HGB 12.2* 01/19/2013    HCT 37.2* 01/19/2013   MCV 83.4 01/19/2013   PLT 286 01/19/2013     Chemistry      Component Value Date/Time   NA 140 01/19/2013 1008   NA 137 01/17/2013 0926   K 3.6 01/19/2013 1008   K 3.8 01/17/2013 0926   CL 103 01/19/2013 1008   CL 97 01/17/2013 0926   CO2 29 01/19/2013 1008   CO2 29 01/17/2013 0926   BUN 14.8 01/19/2013 1008   BUN 17 01/17/2013 0926   CREATININE 0.7 01/19/2013 1008   CREATININE 0.70 01/17/2013 0926      Component Value Date/Time   CALCIUM 9.1 01/19/2013 1008   CALCIUM 8.7 01/17/2013 0926   ALKPHOS 102 01/19/2013 1008   ALKPHOS 93 01/16/2013 0600   AST 26 01/19/2013 1008   AST 13 01/16/2013 0600   ALT 36 01/19/2013 1008   ALT 18 01/16/2013 0600   BILITOT 0.25 01/19/2013 1008   BILITOT 0.3 01/16/2013 0600      Impression and Plan: 52 year old with the following issues:   1. Large right sided pleural effusion s/p thoracentesis on 5/17 with 1.2 L of bloody fluid removed. Fluid analysis consistent with exudative effusion. 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 He is status post pleural catheter inserted on 01/15/2013. I had a lengthy discussion today with Mr. Harold and his family discussing the natural course of malignant pleural effusion and the etiology at this time. Most likely this represents a non-small cell lung cancer although no clear-cut primary have been identified. He understands ultimately he will need systemic chemotherapy but we would like to ideally to have a measurable disease to target.  The plan will be at this point is to continue pleural catheter drainage for the next 2 weeks and then I will obtain a PET CT scan an attempt to identify the primary tumor. I hope by that time he is breathing with improve as well as his overall functionality and that he'll be a better candidate for systemic chemotherapy. Ultimately, he will need a chemotherapy combination with a platinum-based treatment. I will  let septic with followup after the PET CT scan to discuss these results and initiate therapy.   2. Esophageal focal narrowing . I doubt this is malignancy. He is asymptomatic at this point. He will likely need an endoscopy in the future like to also obtain a PET CT scan to help further identify that narrow.   [facility], MD 6/11/201410:50 AM

## 2013-01-19 NOTE — Progress Notes (Signed)
Called and spoke with Ms. Freshold and was told that the patient has not been approved for medicaid yet. I called and left a message for Lauren(social worker).

## 2013-01-19 NOTE — Telephone Encounter (Signed)
Gave pt appt for MD visit for june 2014

## 2013-01-31 ENCOUNTER — Encounter (HOSPITAL_COMMUNITY)
Admission: RE | Admit: 2013-01-31 | Discharge: 2013-01-31 | Disposition: A | Discharge status: Home / Self Care | Payer: Medicaid Other | Source: Ambulatory Visit | Attending: Oncology | Admitting: Oncology

## 2013-01-31 ENCOUNTER — Encounter: Payer: Self-pay | Admitting: Oncology

## 2013-01-31 DIAGNOSIS — C3490 Malignant neoplasm of unspecified part of unspecified bronchus or lung: Secondary | ICD-10-CM | POA: Insufficient documentation

## 2013-01-31 DIAGNOSIS — J910 Malignant pleural effusion: Secondary | ICD-10-CM

## 2013-01-31 DIAGNOSIS — J90 Pleural effusion, not elsewhere classified: Secondary | ICD-10-CM

## 2013-01-31 MED ORDER — FLUDEOXYGLUCOSE F - 18 (FDG) INJECTION
15.3000 | Freq: Once | INTRAVENOUS | Status: AC | PRN
  Administered 2013-01-31: 15.3 via INTRAVENOUS

## 2013-01-31 NOTE — Progress Notes (Signed)
Called and left a message for Lorin Picket case worker for medicaid to check on status of his application. Mrs. Verdis Frederickson came in and has letter of support from her and hubby they have taken Mr. Buchler in. He will come in and sign 400.00 one time grant form when he is finished with radiation. She will be getting his med filled at [facility] pharmacy and needs to get supplies from [facility] or [facility]. I advised they needed to call me and get invoice sent to me and we can get those paid. She is in with Zack for other assistance(Lauren not in office).

## 2013-02-03 DIAGNOSIS — J910 Malignant pleural effusion: Secondary | ICD-10-CM

## 2013-02-04 ENCOUNTER — Telehealth: Payer: Self-pay | Admitting: *Deleted

## 2013-02-04 ENCOUNTER — Encounter: Payer: Self-pay | Admitting: Oncology

## 2013-02-04 ENCOUNTER — Telehealth: Payer: Self-pay | Admitting: Oncology

## 2013-02-04 ENCOUNTER — Ambulatory Visit (HOSPITAL_BASED_OUTPATIENT_CLINIC_OR_DEPARTMENT_OTHER): Payer: Medicaid Other | Admitting: Oncology

## 2013-02-04 DIAGNOSIS — C3490 Malignant neoplasm of unspecified part of unspecified bronchus or lung: Secondary | ICD-10-CM | POA: Insufficient documentation

## 2013-02-04 HISTORY — DX: Malignant neoplasm of unspecified part of unspecified bronchus or lung: C34.90

## 2013-02-04 MED ORDER — PROCHLORPERAZINE MALEATE 10 MG PO TABS: 10.0000 mg | ORAL_TABLET | Freq: Four times a day (QID) | ORAL | Status: DC | PRN

## 2013-02-04 NOTE — Telephone Encounter (Signed)
Per staff message and POF I have scheduled appts.  JMW  

## 2013-02-04 NOTE — Telephone Encounter (Signed)
gv and printed appt sched and avs for pt....MW added tx   °

## 2013-02-04 NOTE — Progress Notes (Signed)
Hematology and Oncology Follow Up Visit  Geoffrey Richardson [id number] 1961/05/06 52 y.o. 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 At that time he presented with weakness, right chest pain and found to have pleural effusion. The cytology is suggesting malignant effusion possibly non-small cell cancer.  Prior Therapy: Patient is status post pleural catheter insertion that was done on 01/15/2013.  Current therapy: Under consideration to start systemic therapy.  Interim History: Geoffrey Richardson is a pleasant 52 year old gentleman I saw in consultation in the hospital on 12/28/2012. As mentioned, he presented with malignant pleural effusion status post thoracentesis with the cytology revealing non-small cell cancer histology. Since his last visit, he has been on relatively fair he still oxygen dependent and has limited mobility. His pain is under better control with Vicodin and oxycodone. He is eating better but does report some decline in his appetite overall. He still have a pleural effusion but the amount of fluid being drained has declined dramatically. He is mobile at home but he fatigues pretty easy and does report some occasional dyspnea on exertion.  Medications: I have reviewed the patient's current medications.  Current Outpatient Prescriptions  Medication Sig Dispense Refill  . albuterol (PROVENTIL HFA;VENTOLIN HFA) 108 (90 BASE) MCG/ACT inhaler Inhale 2 puffs into the lungs every 4 (four) hours as needed for wheezing or shortness of breath.  1 Inhaler  1  . budesonide-formoterol (SYMBICORT) 160-4.5 MCG/ACT inhaler Inhale 2 puffs into the lungs 2 (two) times daily.  1 Inhaler  0  . feeding supplement (ENSURE COMPLETE) LIQD Take 237 mLs by mouth 3 (three) times daily with meals.      Marland Kitchen HYDROcodone-acetaminophen (NORCO/VICODIN) 5-325 MG per tablet Take 1-2 tablets by mouth every  6 (six) hours as needed for pain.  30 tablet  0  . metoprolol tartrate (LOPRESSOR) 25 MG tablet Take 1 tablet (25 mg total) by mouth 2 (two) times daily.  60 tablet  1  . nicotine (NICODERM CQ - DOSED IN MG/24 HOURS) 14 mg/24hr patch Place 1 patch onto the skin daily.  10 patch  0  . pantoprazole (PROTONIX) 40 MG tablet Take 40 mg by mouth at bedtime.      . prochlorperazine (COMPAZINE) 10 MG tablet Take 1 tablet (10 mg total) by mouth every 6 (six) hours as needed.  30 tablet  1   No current facility-administered medications for this visit.     Allergies: No Known Allergies  Past Medical History, Surgical history, Social history, and Family History were reviewed and updated.  Review of Systems: Constitutional:  Negative for fever, chills, night sweats, anorexia, weight loss, pain. Cardiovascular: no chest pain or dyspnea on exertion Respiratory: no cough, shortness of breath, or wheezing Neurological: negative Dermatological: negative ENT: negative Skin: Negative. Gastrointestinal: no abdominal pain, change in bowel habits, or black or bloody stools Genito-Urinary: negative Hematological and Lymphatic: negative Breast: negative Musculoskeletal: negative Remaining ROS negative. Physical Exam: Blood pressure 140/96, pulse 100, temperature 98.4 F (36.9 C), temperature source Oral, resp. rate 20, height 5\' 10"  (1.778 m), weight 195 lb 12.8 oz (88.814 kg). ECOG: 2 General appearance: alert Head: Normocephalic, without obvious abnormality, atraumatic Neck: no adenopathy, no carotid bruit, no JVD, supple, symmetrical, trachea midline and thyroid not enlarged, symmetric, no tenderness/mass/nodules Lymph nodes: Cervical, supraclavicular, and axillary nodes normal. Heart:regular rate and rhythm, S1, S2 normal, no murmur, click, rub or gallop Lung:chest clear, no  wheezing, rales, normal symmetric air entry. He has decreased breath sounds on the right. Abdomin: soft, non-tender, without  masses or organomegaly EXT:no erythema, induration, or nodules   Lab Results: Lab Results  Component Value Date   WBC 8.5 01/19/2013   HGB 12.2* 01/19/2013   HCT 37.2* 01/19/2013   MCV 83.4 01/19/2013   PLT 286 01/19/2013     Chemistry      Component Value Date/Time   NA 140 01/19/2013 1008   NA 137 01/17/2013 0926   K 3.6 01/19/2013 1008   K 3.8 01/17/2013 0926   CL 103 01/19/2013 1008   CL 97 01/17/2013 0926   CO2 29 01/19/2013 1008   CO2 29 01/17/2013 0926   BUN 14.8 01/19/2013 1008   BUN 17 01/17/2013 0926   CREATININE 0.7 01/19/2013 1008   CREATININE 0.70 01/17/2013 0926      Component Value Date/Time   CALCIUM 9.1 01/19/2013 1008   CALCIUM 8.7 01/17/2013 0926   ALKPHOS 102 01/19/2013 1008   ALKPHOS 93 01/16/2013 0600   AST 26 01/19/2013 1008   AST 13 01/16/2013 0600   ALT 36 01/19/2013 1008   ALT 18 01/16/2013 0600   BILITOT 0.25 01/19/2013 1008   BILITOT 0.3 01/16/2013 0600      NUCLEAR MEDICINE PET SKULL BASE TO THIGH  Fasting Blood Glucose: 91  Technique: 15.3 mCi F-18 FDG was injected intravenously. CT data  was obtained and used for attenuation correction and anatomic  localization only. (This was not acquired as a diagnostic CT  examination.) Additional exam technical data entered on  technologist worksheet.  Comparison: CT 12/29/2012  Findings:  Neck: No hypermetabolic lymph nodes in the neck.  Chest: There is a rim hypermetabolic activity within the right  hemithorax which corresponds to the loculated fluid collection with  peripheral calcification which suggest talc pleurodesis . Talc  inflames the pleural with intense metabolic activity. Within the  atelectatic right lower lobe there is no clear focal hypermetabolic  activity.  There is a single hypermetabolic right paratracheal lymph node  measuring 10 mm (image 70). No contralateral hypermetabolic  activity. No suspicious left lung nodules.  Abdomen/Pelvis: No abnormal hypermetabolic activity within the  liver, pancreas,  adrenal glands, or spleen. No hypermetabolic  lymph nodes in the abdomen or pelvis.  Skeleton: No focal hypermetabolic activity to suggest skeletal  metastasis.  IMPRESSION:  1. Rim of hypermetabolic activity within the right hemithorax  likely relates to pleurodesis.  2. Hypermetabolic right paratracheal lymph node is either  reactive or metastatic.  3. No focal hypermetabolic activity within the atelectatic right  lower lobe.  4. No evidence distant metastasis.   Impression and Plan: 52 year old with the following issues:   1. Large right sided pleural effusion s/p thoracentesis on 5/17 with 1.2 L of bloody fluid removed. Fluid analysis consistent with exudative effusion. 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 He is status post pleural catheter inserted on 01/15/2013. PET CT scan was discussed today that was obtained on 01/31/2013. The imaging study did not really show any primary tumor in the lung and except for a mildly hypermetabolic right paratracheal lymph node there was really no measurable disease. Despite these findings, I feel that he will need systemic chemotherapy sooner rather than later I think he had improved enough to warrant starting chemotherapy. 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 The  risks and benefits of doublet chemotherapy were discussed and most likely combination of carboplatin and Taxotere were discussed today in detail. Complications that includes nausea, vomiting, peripheral neuropathy, myelosuppression, infusion-related toxicities as well as possible need for growth factor support, transfusion and rarely hospitalization due to acute illness. The benefits would be reduction in palliation of his cancer loading an improvement in his quality of life which I think will be substantial.  I will arrange to start his  chemotherapy in the near future after chemotherapy education class of also given him a prescription for anti-emetics.  From an IV access standpoint we will use his peripheral veins at this time but I discussed with him briefly the possible need for a Port-A-Cath insertion should his peripheral veins becomes problematic.  2. Esophageal focal narrowing . I doubt this is malignancy. He is asymptomatic at this point. A PET/CT scan failed to show anything to suggest esophageal malignancy.  All his questions were answered today we will we will set him up with the first cycle of chemotherapy in the near future in followup at that time.  [facility], MD 6/27/20143:53 PM

## 2013-02-07 ENCOUNTER — Other Ambulatory Visit: Payer: Self-pay | Admitting: *Deleted

## 2013-02-07 DIAGNOSIS — J90 Pleural effusion, not elsewhere classified: Secondary | ICD-10-CM

## 2013-02-09 ENCOUNTER — Encounter: Payer: Self-pay | Admitting: Cardiothoracic Surgery

## 2013-02-09 ENCOUNTER — Ambulatory Visit (INDEPENDENT_AMBULATORY_CARE_PROVIDER_SITE_OTHER): Payer: Self-pay | Admitting: Cardiothoracic Surgery

## 2013-02-09 ENCOUNTER — Ambulatory Visit
Admission: RE | Admit: 2013-02-09 | Discharge: 2013-02-09 | Disposition: A | Discharge status: Home / Self Care | Payer: Medicaid Other | Source: Ambulatory Visit | Attending: Cardiothoracic Surgery | Admitting: Cardiothoracic Surgery

## 2013-02-09 VITALS — BP 145/100 | HR 100 | Resp 20 | Ht 70.0 in | Wt 195.0 lb

## 2013-02-09 DIAGNOSIS — Z09 Encounter for follow-up examination after completed treatment for conditions other than malignant neoplasm: Secondary | ICD-10-CM

## 2013-02-09 DIAGNOSIS — J90 Pleural effusion, not elsewhere classified: Secondary | ICD-10-CM

## 2013-02-09 DIAGNOSIS — J910 Malignant pleural effusion: Secondary | ICD-10-CM

## 2013-02-09 NOTE — Progress Notes (Signed)
PCP is Eli Hose, MD Referring Provider is [name], MD  Chief Complaint  Patient presents with  . Routine Post Op    2 week f/u with CXR, S/P Rt VATS, TALC, placement of Rt pleurx cath on 01/14/13    HPI: Patient returns for her first office followup after right VATS drainage of malignant effusion and placement of Pleurx catheter. 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 He will start chemotherapy under the direction of Dr. Clelia Croft.in 10 days.  Past Medical History  Diagnosis Date  . Tobacco abuse     Began age 71  . Pleural effusion on right   . COPD (chronic obstructive pulmonary disease)   . Hypertension     pt states it fluctuates but not on any medications  . Emphysema   . Shortness of breath     lying/sitting/exertion  . Headache(784.0)     occasionally  . GERD (gastroesophageal reflux disease)     takes Protonix daily  . Nocturia   . Hypothyroidism     but not on medication at the present time  . Insomnia   . Lung cancer 02/04/2013    Past Surgical History  Procedure Laterality Date  . Appendectomy      as a child  . Video assisted thoracoscopy (vats) w/talc pleuadesis Right 01/14/2013    Procedure: VIDEO ASSISTED THORACOSCOPY (VATS) W/TALC PLEUADESIS;  Surgeon: Kerin Perna, MD;  Location: [facility] OR;  Service: Thoracic;  Laterality: Right;  . Chest tube insertion Right 01/14/2013    Procedure: INSERTION PLEURAL DRAINAGE CATHETER;  Surgeon: Kerin Perna, MD;  Location: [facility] OR;  Service: Thoracic;  Laterality: Right;    Family History  Problem Relation Age of Onset  . ALS Mother     Deceased 73  . Other Father     Deceased from [facility], hit by drunk driver.  Hx of prostate issues    Social History History  Substance Use Topics  . Smoking status: Former Smoker -- 2.00 packs/day for 36 years    Types: Cigarettes  . Smokeless tobacco: Never Used     Comment: no smoking since 12/24/12 states had smoked since age 49  . Alcohol  Use: Yes     Comment: occasionally    Current Outpatient Prescriptions  Medication Sig Dispense Refill  . albuterol (PROVENTIL HFA;VENTOLIN HFA) 108 (90 BASE) MCG/ACT inhaler Inhale 2 puffs into the lungs every 4 (four) hours as needed for wheezing or shortness of breath.  1 Inhaler  1  . budesonide-formoterol (SYMBICORT) 160-4.5 MCG/ACT inhaler Inhale 2 puffs into the lungs 2 (two) times daily.  1 Inhaler  0  . feeding supplement (ENSURE COMPLETE) LIQD Take 237 mLs by mouth 3 (three) times daily with meals.      Marland Kitchen HYDROcodone-acetaminophen (NORCO/VICODIN) 5-325 MG per tablet Take 1-2 tablets by mouth every 6 (six) hours as needed for pain.  30 tablet  0  . metoprolol tartrate (LOPRESSOR) 25 MG tablet Take 1 tablet (25 mg total) by mouth 2 (two) times daily.  60 tablet  1  . nicotine (NICODERM CQ - DOSED IN MG/24 HOURS) 14 mg/24hr patch Place 1 patch onto the skin daily.  10 patch  0  . pantoprazole (PROTONIX) 40 MG tablet Take 40 mg by mouth at bedtime.      . prochlorperazine (COMPAZINE) 10 MG tablet Take 1 tablet (10 mg total) by mouth every 6 (six) hours as needed.  30 tablet  1  No current facility-administered medications for this visit.    No Known Allergies  Review of Systems doing fairly well on home oxygen taking narcotics for pain Pleurx catheter his drain between 150 and 50 cc the last week. Shortness of breath considerably better after VATS procedure and Pleurx catheter  BP 145/100  Pulse 100  Resp 20  Ht 5\' 10"  (1.778 m)  Wt 195 lb (88.451 kg)  BMI 27.98 kg/m2  SpO2 98% Physical Exam Acute and chronically ill appearing Diminished breath sounds at the right base Heart rate regular Pleurx catheter suture removed, chest tube sutures removed all surgical incisions healing well.  Diagnostic Tests: Chest x-ray shows some reaccumulation of pleural fluid but also significant atelectasis the right lower lobe. With talc pleurodesed is the pleural fluid drainage should  gradually taper off. We now will reduce drainage to twice a week. When the drainage becomes minimal over 1-2 weeks and will schedule Pleurx catheter removal.  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 Will return for chest x-ray in 3 weeks.

## 2013-02-10 ENCOUNTER — Other Ambulatory Visit: Payer: Medicaid Other

## 2013-02-17 ENCOUNTER — Ambulatory Visit (HOSPITAL_BASED_OUTPATIENT_CLINIC_OR_DEPARTMENT_OTHER): Payer: Medicaid Other

## 2013-02-17 ENCOUNTER — Other Ambulatory Visit (HOSPITAL_BASED_OUTPATIENT_CLINIC_OR_DEPARTMENT_OTHER): Payer: Medicaid Other | Admitting: Lab

## 2013-02-17 DIAGNOSIS — C801 Malignant (primary) neoplasm, unspecified: Secondary | ICD-10-CM

## 2013-02-17 DIAGNOSIS — Z5111 Encounter for antineoplastic chemotherapy: Secondary | ICD-10-CM

## 2013-02-17 DIAGNOSIS — J910 Malignant pleural effusion: Secondary | ICD-10-CM

## 2013-02-17 LAB — CBC WITH DIFFERENTIAL/PLATELET
BASO%: 0.4 % (ref 0.0–2.0)
EOS%: 0.8 % (ref 0.0–7.0)
Eosinophils Absolute: 0.1 10*3/uL (ref 0.0–0.5)
MCHC: 31.1 g/dL — ABNORMAL LOW (ref 32.0–36.0)
MCV: 82.8 fL (ref 79.3–98.0)
MONO%: 7.5 % (ref 0.0–14.0)
NEUT#: 4.6 10*3/uL (ref 1.5–6.5)
RBC: 4.7 10*6/uL (ref 4.20–5.82)
RDW: 14.1 % (ref 11.0–14.6)
nRBC: 0 % (ref 0–0)

## 2013-02-17 LAB — COMPREHENSIVE METABOLIC PANEL (CC13)
ALT: 14 U/L (ref 0–55)
AST: 14 U/L (ref 5–34)
Alkaline Phosphatase: 108 U/L (ref 40–150)
CO2: 26 mEq/L (ref 22–29)
Sodium: 141 mEq/L (ref 136–145)
Total Bilirubin: 0.22 mg/dL (ref 0.20–1.20)
Total Protein: 8.3 g/dL (ref 6.4–8.3)

## 2013-02-17 MED ORDER — SODIUM CHLORIDE 0.9 % IV SOLN
Freq: Once | INTRAVENOUS | Status: AC
  Administered 2013-02-17: 13:00:00 via INTRAVENOUS

## 2013-02-17 MED ORDER — SODIUM CHLORIDE 0.9 % IV SOLN
70.0000 mg/m2 | Freq: Once | INTRAVENOUS | Status: AC
  Administered 2013-02-17: 150 mg via INTRAVENOUS
  Filled 2013-02-17: qty 15

## 2013-02-17 MED ORDER — DEXAMETHASONE SODIUM PHOSPHATE 20 MG/5ML IJ SOLN
20.0000 mg | Freq: Once | INTRAMUSCULAR | Status: AC
  Administered 2013-02-17: 20 mg via INTRAVENOUS

## 2013-02-17 MED ORDER — CARBOPLATIN CHEMO INJECTION 600 MG/60ML
750.0000 mg | Freq: Once | INTRAVENOUS | Status: AC
  Administered 2013-02-17: 750 mg via INTRAVENOUS
  Filled 2013-02-17: qty 75

## 2013-02-17 MED ORDER — ONDANSETRON 16 MG/50ML IVPB (CHCC)
16.0000 mg | Freq: Once | INTRAVENOUS | Status: AC
  Administered 2013-02-17: 16 mg via INTRAVENOUS

## 2013-02-17 NOTE — Patient Instructions (Signed)
[facility] Discharge Instructions for Patients Receiving Chemotherapy  Today you received the following chemotherapy agents Taxotere/Carboplatin  To help prevent nausea and vomiting after your treatment, we encourage you to take your nausea medication as needed   If you develop nausea and vomiting that is not controlled by your nausea medication, call the clinic.   BELOW ARE SYMPTOMS THAT SHOULD BE REPORTED IMMEDIATELY:  *FEVER GREATER THAN 100.5 F  *CHILLS WITH OR WITHOUT FEVER  NAUSEA AND VOMITING THAT IS NOT CONTROLLED WITH YOUR NAUSEA MEDICATION  *UNUSUAL SHORTNESS OF BREATH  *UNUSUAL BRUISING OR BLEEDING  TENDERNESS IN MOUTH AND THROAT WITH OR WITHOUT PRESENCE OF ULCERS  *URINARY PROBLEMS  *BOWEL PROBLEMS  UNUSUAL RASH Items with * indicate a potential emergency and should be followed up as soon as possible.  Feel free to call the clinic you have any questions or concerns. The clinic phone number is [phone].    

## 2013-02-18 ENCOUNTER — Ambulatory Visit (HOSPITAL_BASED_OUTPATIENT_CLINIC_OR_DEPARTMENT_OTHER): Payer: Medicaid Other

## 2013-02-18 ENCOUNTER — Telehealth: Payer: Self-pay | Admitting: *Deleted

## 2013-02-18 DIAGNOSIS — J910 Malignant pleural effusion: Secondary | ICD-10-CM

## 2013-02-18 MED ORDER — PEGFILGRASTIM INJECTION 6 MG/0.6ML
6.0000 mg | Freq: Once | SUBCUTANEOUS | Status: AC
  Administered 2013-02-18: 6 mg via SUBCUTANEOUS
  Filled 2013-02-18: qty 0.6

## 2013-02-18 NOTE — Telephone Encounter (Signed)
Selden here for Neulasta injection following 1st taxotere/carbo chemotherapy.  States that he is doing good  No nausea, vomiting, or diarrhea.  All questions answered  Knows to call if he has any problems or concerns.

## 2013-02-18 NOTE — Patient Instructions (Addendum)

## 2013-03-09 ENCOUNTER — Ambulatory Visit: Payer: Medicaid Other | Admitting: Cardiothoracic Surgery

## 2013-03-10 ENCOUNTER — Other Ambulatory Visit (HOSPITAL_BASED_OUTPATIENT_CLINIC_OR_DEPARTMENT_OTHER): Payer: Medicaid Other | Admitting: Lab

## 2013-03-10 ENCOUNTER — Ambulatory Visit (HOSPITAL_BASED_OUTPATIENT_CLINIC_OR_DEPARTMENT_OTHER): Payer: Medicaid Other | Admitting: Oncology

## 2013-03-10 ENCOUNTER — Encounter: Payer: Self-pay | Admitting: Oncology

## 2013-03-10 ENCOUNTER — Ambulatory Visit (HOSPITAL_BASED_OUTPATIENT_CLINIC_OR_DEPARTMENT_OTHER): Payer: Medicaid Other

## 2013-03-10 DIAGNOSIS — C801 Malignant (primary) neoplasm, unspecified: Secondary | ICD-10-CM

## 2013-03-10 DIAGNOSIS — I10 Essential (primary) hypertension: Secondary | ICD-10-CM

## 2013-03-10 DIAGNOSIS — K219 Gastro-esophageal reflux disease without esophagitis: Secondary | ICD-10-CM

## 2013-03-10 DIAGNOSIS — J449 Chronic obstructive pulmonary disease, unspecified: Secondary | ICD-10-CM

## 2013-03-10 DIAGNOSIS — J910 Malignant pleural effusion: Secondary | ICD-10-CM

## 2013-03-10 DIAGNOSIS — Z5111 Encounter for antineoplastic chemotherapy: Secondary | ICD-10-CM

## 2013-03-10 LAB — COMPREHENSIVE METABOLIC PANEL (CC13)
Albumin: 2.9 g/dL — ABNORMAL LOW (ref 3.5–5.0)
BUN: 11 mg/dL (ref 7.0–26.0)
Calcium: 9 mg/dL (ref 8.4–10.4)
Chloride: 109 mEq/L (ref 98–109)
Creatinine: 0.7 mg/dL (ref 0.7–1.3)
Glucose: 99 mg/dl (ref 70–140)
Potassium: 3.7 mEq/L (ref 3.5–5.1)

## 2013-03-10 LAB — CBC WITH DIFFERENTIAL/PLATELET
Basophils Absolute: 0 10*3/uL (ref 0.0–0.1)
HCT: 39.3 % (ref 38.4–49.9)
HGB: 12 g/dL — ABNORMAL LOW (ref 13.0–17.1)
LYMPH%: 26.8 % (ref 14.0–49.0)
MCH: 25.8 pg — ABNORMAL LOW (ref 27.2–33.4)
MONO#: 0.9 10*3/uL (ref 0.1–0.9)
NEUT%: 63.6 % (ref 39.0–75.0)
Platelets: 177 10*3/uL (ref 140–400)
WBC: 9.8 10*3/uL (ref 4.0–10.3)
lymph#: 2.6 10*3/uL (ref 0.9–3.3)

## 2013-03-10 MED ORDER — DIPHENHYDRAMINE HCL 50 MG/ML IJ SOLN
25.0000 mg | Freq: Once | INTRAMUSCULAR | Status: AC | PRN
  Administered 2013-03-10: 25 mg via INTRAVENOUS

## 2013-03-10 MED ORDER — DOCETAXEL CHEMO INJECTION 160 MG/16ML
70.0000 mg/m2 | Freq: Once | INTRAVENOUS | Status: AC
  Administered 2013-03-10: 150 mg via INTRAVENOUS
  Filled 2013-03-10: qty 15

## 2013-03-10 MED ORDER — ONDANSETRON 16 MG/50ML IVPB (CHCC)
16.0000 mg | Freq: Once | INTRAVENOUS | Status: AC
  Administered 2013-03-10: 16 mg via INTRAVENOUS

## 2013-03-10 MED ORDER — PANTOPRAZOLE SODIUM 40 MG PO TBEC: 40.0000 mg | DELAYED_RELEASE_TABLET | Freq: Every day | ORAL | Status: DC

## 2013-03-10 MED ORDER — BUDESONIDE-FORMOTEROL FUMARATE 160-4.5 MCG/ACT IN AERO: 2.0000 | INHALATION_SPRAY | Freq: Two times a day (BID) | RESPIRATORY_TRACT | Status: DC

## 2013-03-10 MED ORDER — METOPROLOL TARTRATE 25 MG PO TABS: 25.0000 mg | ORAL_TABLET | Freq: Two times a day (BID) | ORAL | Status: DC

## 2013-03-10 MED ORDER — HYDROCODONE-ACETAMINOPHEN 5-325 MG PO TABS: 1.0000 | ORAL_TABLET | Freq: Four times a day (QID) | ORAL | Status: DC | PRN

## 2013-03-10 MED ORDER — DEXAMETHASONE SODIUM PHOSPHATE 20 MG/5ML IJ SOLN
20.0000 mg | Freq: Once | INTRAMUSCULAR | Status: AC
  Administered 2013-03-10: 20 mg via INTRAVENOUS

## 2013-03-10 MED ORDER — ONDANSETRON HCL 8 MG PO TABS: 8.0000 mg | ORAL_TABLET | Freq: Three times a day (TID) | ORAL | Status: DC | PRN

## 2013-03-10 MED ORDER — SODIUM CHLORIDE 0.9 % IV SOLN
Freq: Once | INTRAVENOUS | Status: AC
  Administered 2013-03-10: 15:00:00 via INTRAVENOUS

## 2013-03-10 MED ORDER — SODIUM CHLORIDE 0.9 % IV SOLN
750.0000 mg | Freq: Once | INTRAVENOUS | Status: AC
  Administered 2013-03-10: 750 mg via INTRAVENOUS
  Filled 2013-03-10: qty 75

## 2013-03-10 MED ORDER — PROCHLORPERAZINE MALEATE 10 MG PO TABS: 10.0000 mg | ORAL_TABLET | Freq: Four times a day (QID) | ORAL | Status: DC | PRN

## 2013-03-10 NOTE — Progress Notes (Signed)
Hematology and Oncology Follow Up Visit  Geoffrey Richardson [id number] 01-Jul-1961 52 y.o. 03/10/2013 4:17 PM [name],FIRAS, MDShadad, [name], MD   Principle Diagnosis: 52 year old gentleman with diagnosis of malignant pleural effusion noted in May of 2014. At that time he presented with weakness, right chest pain and found to have pleural effusion. The cytology is suggesting malignant effusion possibly non-small cell cancer.  Prior Therapy: Patient is status post pleural catheter insertion that was done on 01/15/2013.  Current therapy: Started systemic chemotherapy with Carboplatin and Taxotere on 02/17/13.  Interim History: Geoffrey Richardson is a pleasant 52 year old gentleman initially seen in consultation in the hospital on 12/28/2012. As mentioned, he presented with malignant pleural effusion status post thoracentesis with the cytology revealing non-small cell cancer histology. Received his first cycle of chemo and developed fatigue, nausea, and vomiting. He is not sure if he has the Compazine that has been prescribed for him He is also out of all of his medications. He still oxygen dependent (2-3L/min) and has limited mobility. He still has pain to his Pleurex catheter site; he is out of pain medication. He is eating better and has gained weight. He still have a pleural effusion and states that he drains 350 cc twice a week. He is mobile at home but he fatigues pretty easy and does report some occasional dyspnea on exertion.  Medications: I have reviewed the patient's current medications.  Current Outpatient Prescriptions  Medication Sig Dispense Refill  . albuterol (PROVENTIL HFA;VENTOLIN HFA) 108 (90 BASE) MCG/ACT inhaler Inhale 2 puffs into the lungs every 4 (four) hours as needed for wheezing or shortness of breath.  1 Inhaler  1  . budesonide-formoterol (SYMBICORT) 160-4.5 MCG/ACT inhaler Inhale 2 puffs into the lungs 2 (two) times daily.  1 Inhaler  1  . feeding supplement (ENSURE COMPLETE) LIQD Take  237 mLs by mouth 3 (three) times daily with meals.      Marland Kitchen HYDROcodone-acetaminophen (NORCO/VICODIN) 5-325 MG per tablet Take 1-2 tablets by mouth every 6 (six) hours as needed for pain.  50 tablet  0  . metoprolol tartrate (LOPRESSOR) 25 MG tablet Take 1 tablet (25 mg total) by mouth 2 (two) times daily.  60 tablet  1  . nicotine (NICODERM CQ - DOSED IN MG/24 HOURS) 14 mg/24hr patch Place 1 patch onto the skin daily.  10 patch  0  . ondansetron (ZOFRAN) 8 MG tablet Take 1 tablet (8 mg total) by mouth every 8 (eight) hours as needed for nausea.  20 tablet  2  . pantoprazole (PROTONIX) 40 MG tablet Take 1 tablet (40 mg total) by mouth at bedtime.  30 tablet  2  . prochlorperazine (COMPAZINE) 10 MG tablet Take 1 tablet (10 mg total) by mouth every 6 (six) hours as needed.  60 tablet  1   No current facility-administered medications for this visit.   Facility-Administered Medications Ordered in Other Visits  Medication Dose Route Frequency Provider Last Rate Last Dose  . CARBOplatin (PARAPLATIN) 750 mg in sodium chloride 0.9 % 250 mL chemo infusion  750 mg Intravenous Once [name], MD      . DOCEtaxel (TAXOTERE) 150 mg in dextrose 5 % 250 mL chemo infusion  70 mg/m2 (Treatment Plan Actual) Intravenous Once [name], MD   150 mg at 03/10/13 1551     Allergies: No Known Allergies  Past Medical History, Surgical history, Social history, and Family History were reviewed and updated.  Review of Systems: Constitutional:  Negative for fever,  chills, night sweats, anorexia, weight loss, pain. Cardiovascular: no chest pain or dyspnea on exertion Respiratory: no cough, shortness of breath, or wheezing Neurological: negative Dermatological: negative ENT: negative Skin: Negative. Gastrointestinal: no abdominal pain, change in bowel habits, or black or bloody stools Genito-Urinary: negative Hematological and Lymphatic: negative Breast: negative Musculoskeletal: negative Remaining ROS  negative.  Physical Exam: Blood pressure 143/106, pulse 96, temperature 98.4 F (36.9 C), temperature source Oral, resp. rate 20, height 5\' 10"  (1.778 m), weight 198 lb (89.812 kg). ECOG: 2 General appearance: alert Head: Normocephalic, without obvious abnormality, atraumatic Neck: no adenopathy, no carotid bruit, no JVD, supple, symmetrical, trachea midline and thyroid not enlarged, symmetric, no tenderness/mass/nodules Lymph nodes: Cervical, supraclavicular, and axillary nodes normal. Heart:regular rate and rhythm, S1, S2 normal, no murmur, click, rub or gallop Lung:chest clear, no wheezing, rales, normal symmetric air entry. He has decreased breath sounds on the right. Abdomen: soft, non-tender, without masses or organomegaly EXT:no erythema, induration, or nodules   Lab Results: Lab Results  Component Value Date   WBC 9.8 03/10/2013   HGB 12.0* 03/10/2013   HCT 39.3 03/10/2013   MCV 84.3 03/10/2013   PLT 177 03/10/2013     Chemistry      Component Value Date/Time   NA 143 03/10/2013 1222   NA 137 01/17/2013 0926   K 3.7 03/10/2013 1222   K 3.8 01/17/2013 0926   CL 103 01/19/2013 1008   CL 97 01/17/2013 0926   CO2 25 03/10/2013 1222   CO2 29 01/17/2013 0926   BUN 11.0 03/10/2013 1222   BUN 17 01/17/2013 0926   CREATININE 0.7 03/10/2013 1222   CREATININE 0.70 01/17/2013 0926      Component Value Date/Time   CALCIUM 9.0 03/10/2013 1222   CALCIUM 8.7 01/17/2013 0926   ALKPHOS 136 03/10/2013 1222   ALKPHOS 93 01/16/2013 0600   AST 13 03/10/2013 1222   AST 13 01/16/2013 0600   ALT 20 03/10/2013 1222   ALT 18 01/16/2013 0600   BILITOT 0.20 03/10/2013 1222   BILITOT 0.3 01/16/2013 0600      Impression and Plan: 52 year old with the following issues:   1. Large right sided pleural effusion s/p thoracentesis on 5/17 with 1.2 L of bloody fluid removed. Fluid analysis consistent with exudative effusion. 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 Malignant pleural effusion likely stage  IV non-small cell. He is status post pleural catheter inserted on 01/15/2013. He is currently receiving systemic chemotherapy with Carboplatin and Taxotere. He has grade 1-2 fatigue and developed grade 1 nausea/vomiting. Recommend that he proceed with cycle 2 of chemo today without dose modification.  2. Esophageal focal narrowing . I doubt this is malignancy. He is asymptomatic at this point. A PET/CT scan failed to show anything to suggest esophageal malignancy.  3. Nausea/vomiting prophylaxis. I have refilled his Compazine and have given him Zofran as well.   4. GERD. I have refilled his Protonix.  5. COPD: He is on Albuterol PRN. I have refilled his Symbicort as he is completely out of this medication. On O2 2-3 L.  6. HTN. BP elevated today. I have refilled his Metoprolol as he is out of this medication.  7. Pain control. He has pain due to the Pleurex catheter. I have refilled his Vicodin today.  8. Neutropenia prophylaxis. He receives Neulasta with each chemo.   9. Follow-up. In 3 weeks.   [place], [place] 7/31/20144:17 PM

## 2013-03-10 NOTE — Progress Notes (Signed)
1631: Taxotere resumed at this time. Reaction teaching reviewed, pt voiced understanding.1841: Pt tolerated remainder of Taxotere (titrated slowly to full rate) without difficulty.

## 2013-03-10 NOTE — Patient Instructions (Addendum)
[facility] Discharge Instructions for Patients Receiving Chemotherapy  Today you received the following chemotherapy agents taxotere and carboplatin.   To help prevent nausea and vomiting after your treatment, we encourage you to take your nausea medication, Zofran.  Take one twice daily for three days. Begin the morning of 8/1. Take Compazine every six hours as needed for nausea. (May make you drowsy.)   If you develop nausea and vomiting that is not controlled by your nausea medication, call the clinic.   BELOW ARE SYMPTOMS THAT SHOULD BE REPORTED IMMEDIATELY:  *FEVER GREATER THAN 100.5 F  *CHILLS WITH OR WITHOUT FEVER  NAUSEA AND VOMITING THAT IS NOT CONTROLLED WITH YOUR NAUSEA MEDICATION  *UNUSUAL SHORTNESS OF BREATH  *UNUSUAL BRUISING OR BLEEDING  TENDERNESS IN MOUTH AND THROAT WITH OR WITHOUT PRESENCE OF ULCERS  *URINARY PROBLEMS  *BOWEL PROBLEMS  UNUSUAL RASH Items with * indicate a potential emergency and should be followed up as soon as possible.  Feel free to call the clinic should you have any questions or concerns. The clinic phone number is [phone].

## 2013-03-10 NOTE — Patient Instructions (Signed)
Take Anti Nausea Meds as Directed: 1. Zofran (ondansetron) 8 mg every 8 hrs as needed. Start 1st night of chemotherapy and continue every 8 hrs for at least 3 days post chemo. This is more preventative for chemotherapy induced nausea and also non sedating.  2. For unrelieved nausea; Add Compazine (prochloraperzine) 10 mg every 6 hrs as needed. Take every 6 hrs if any nausea. This is sedating.

## 2013-03-10 NOTE — Progress Notes (Signed)
Patient on O2 at 2Lvia nasal canula. 4 minutes into taxotere pt. C/o feeling flushed and his chest feeling tight @1404 .  Taxotere stopped.   VS stable (see note).  Dr. Clelia Croft called.  Benadryl 25mg  IV push given 1406. Pt. States he is feeling better.  Dr. Clelia Croft here to see patient.  Ok to rechallenge patient in 5 minutes.

## 2013-03-11 ENCOUNTER — Other Ambulatory Visit: Payer: Self-pay | Admitting: Oncology

## 2013-03-11 ENCOUNTER — Ambulatory Visit (HOSPITAL_BASED_OUTPATIENT_CLINIC_OR_DEPARTMENT_OTHER): Payer: Medicaid Other

## 2013-03-11 DIAGNOSIS — I10 Essential (primary) hypertension: Secondary | ICD-10-CM

## 2013-03-11 DIAGNOSIS — C801 Malignant (primary) neoplasm, unspecified: Secondary | ICD-10-CM

## 2013-03-11 DIAGNOSIS — J910 Malignant pleural effusion: Secondary | ICD-10-CM

## 2013-03-11 MED ORDER — CLONIDINE HCL 0.1 MG PO TABS
0.1000 mg | ORAL_TABLET | Freq: Once | ORAL | Status: AC
  Administered 2013-03-11: 0.1 mg via ORAL

## 2013-03-11 MED ORDER — PEGFILGRASTIM INJECTION 6 MG/0.6ML
6.0000 mg | Freq: Once | SUBCUTANEOUS | Status: AC
  Administered 2013-03-11: 6 mg via SUBCUTANEOUS
  Filled 2013-03-11: qty 0.6

## 2013-03-11 MED ORDER — HYDROCODONE-ACETAMINOPHEN 5-325 MG PO TABS
1.0000 | ORAL_TABLET | Freq: Once | ORAL | Status: AC
  Administered 2013-03-11: 1 via ORAL

## 2013-03-11 NOTE — Progress Notes (Signed)
1345-Pt c/o "sharp, stabbing pain to right back."  Rates pain at a 10/10 on pain scale.  Pt states that home health nurse drained his pleurx catheter this morning and that he has been in pain since catheter was drained.  Pt states that he has no pain meds at home and will not be able to pick them up until tomorrow d/t transportation issues.  BP-159/113, HR-95. K. Curcio NP notified of above and order received to give pt one Vicodin now and a Clonidine 0.1 mg now.  Pt given prescription for Vicodin yesterday at visit with NP and denies need for any further pain medication until tomorrow.  Pt states that he has someone that is available to pick his Vicodin up first thing in the morning and that he will not need any other pain medicine today.  1405-Vicodin and Clonidine given as ordered.  1435-BP now 141/93 and HR-99.  Pt states that his pain is a 5/10 and that he is "much more comfortable" and that he will "be ok until tomorrow for pain meds."   1500-Taxi voucher obtained for pt.'s discharge to home. Pt with no further complaints or questions at this time.

## 2013-03-11 NOTE — Patient Instructions (Addendum)

## 2013-03-15 ENCOUNTER — Other Ambulatory Visit: Payer: Self-pay | Admitting: *Deleted

## 2013-03-15 DIAGNOSIS — J90 Pleural effusion, not elsewhere classified: Secondary | ICD-10-CM

## 2013-03-16 ENCOUNTER — Ambulatory Visit
Admission: RE | Admit: 2013-03-16 | Discharge: 2013-03-16 | Disposition: A | Discharge status: Home / Self Care | Payer: Medicaid Other | Source: Ambulatory Visit | Attending: Cardiothoracic Surgery | Admitting: Cardiothoracic Surgery

## 2013-03-16 ENCOUNTER — Encounter: Payer: Self-pay | Admitting: Cardiothoracic Surgery

## 2013-03-16 ENCOUNTER — Other Ambulatory Visit: Payer: Self-pay | Admitting: *Deleted

## 2013-03-16 ENCOUNTER — Ambulatory Visit (INDEPENDENT_AMBULATORY_CARE_PROVIDER_SITE_OTHER): Payer: Self-pay | Admitting: Cardiothoracic Surgery

## 2013-03-16 VITALS — BP 119/83 | HR 114 | Resp 20 | Ht 70.0 in | Wt 198.0 lb

## 2013-03-16 DIAGNOSIS — J910 Malignant pleural effusion: Secondary | ICD-10-CM

## 2013-03-16 DIAGNOSIS — R609 Edema, unspecified: Secondary | ICD-10-CM

## 2013-03-16 DIAGNOSIS — R0602 Shortness of breath: Secondary | ICD-10-CM

## 2013-03-16 DIAGNOSIS — J90 Pleural effusion, not elsewhere classified: Secondary | ICD-10-CM

## 2013-03-16 DIAGNOSIS — Z09 Encounter for follow-up examination after completed treatment for conditions other than malignant neoplasm: Secondary | ICD-10-CM

## 2013-03-16 NOTE — Progress Notes (Signed)
PCP is [name],FIRAS, MD Referring Provider is Parrett, Virgel Bouquet, NP  Chief Complaint  Patient presents with  . Routine Post Op    4 week f/u with CXR, Advance is draining rt PleurX cath every Tues/Thurs.    HPI: Routine followup of right Pleurx catheter for malignant effusion. 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  Pleurx catheter drained on Tuesdays and Thursdays approximately 300 cc each drainage. Catheter site is clean and noninfected.  Patient has noted increased dyspnea exertion over the past 2 weeks. He had a recent chemotherapy cycle. His hemoglobin  1 week ago was 12.4.. Ultrasound of his lower Chairman knees today shows no evidence of DVT. Chest x-ray does not show significant effusion but does show collapse of the right lung. Left lung is clear. Cardiac shadow is not enlarged to suggest pericardial effusion.  Past Medical History  Diagnosis Date  . Tobacco abuse     Began age 79  . Pleural effusion on right   . COPD (chronic obstructive pulmonary disease)   . Hypertension     pt states it fluctuates but not on any medications  . Emphysema   . Shortness of breath     lying/sitting/exertion  . Headache(784.0)     occasionally  . GERD (gastroesophageal reflux disease)     takes Protonix daily  . Nocturia   . Hypothyroidism     but not on medication at the present time  . Insomnia   . Lung cancer 02/04/2013    Past Surgical History  Procedure Laterality Date  . Appendectomy      as a child  . Video assisted thoracoscopy (vats) w/talc pleuadesis Right 01/14/2013    Procedure: VIDEO ASSISTED THORACOSCOPY (VATS) W/TALC PLEUADESIS;  Surgeon: Kerin Perna, MD;  Location: [facility] OR;  Service: Thoracic;  Laterality: Right;  . Chest tube insertion Right 01/14/2013    Procedure: INSERTION PLEURAL DRAINAGE CATHETER;  Surgeon: Kerin Perna, MD;  Location: [facility] OR;  Service: Thoracic;  Laterality: Right;     Family History  Problem Relation Age of Onset  . ALS Mother     Deceased 44  . Other Father     Deceased from [facility], hit by drunk driver.  Hx of prostate issues    Social History History  Substance Use Topics  . Smoking status: Former Smoker -- 2.00 packs/day for 36 years    Types: Cigarettes  . Smokeless tobacco: Never Used     Comment: no smoking since 12/24/12 states had smoked since age 86  . Alcohol Use: Yes     Comment: occasionally    Current Outpatient Prescriptions  Medication Sig Dispense Refill  . albuterol (PROVENTIL HFA;VENTOLIN HFA) 108 (90 BASE) MCG/ACT inhaler Inhale 2 puffs into the lungs every 4 (four) hours as needed for wheezing or shortness of breath.  1 Inhaler  1  . budesonide-formoterol (SYMBICORT) 160-4.5 MCG/ACT inhaler Inhale 2 puffs into the lungs 2 (two) times daily.  1 Inhaler  1  . feeding supplement (ENSURE COMPLETE) LIQD Take 237 mLs by mouth 3 (three) times daily with meals.      Marland Kitchen HYDROcodone-acetaminophen (NORCO/VICODIN) 5-325 MG per tablet Take 1-2 tablets by mouth every 6 (six) hours as needed for pain.  50 tablet  0  . metoprolol tartrate (LOPRESSOR) 25 MG tablet Take 1 tablet (25 mg total) by mouth 2 (two) times daily.  60 tablet  1  . ondansetron (ZOFRAN) 8 MG tablet Take  1 tablet (8 mg total) by mouth every 8 (eight) hours as needed for nausea.  20 tablet  2  . pantoprazole (PROTONIX) 40 MG tablet Take 1 tablet (40 mg total) by mouth at bedtime.  30 tablet  2  . prochlorperazine (COMPAZINE) 10 MG tablet Take 1 tablet (10 mg total) by mouth every 6 (six) hours as needed.  60 tablet  1   No current facility-administered medications for this visit.    No Known Allergies  Review of Systems  BP 119/83  Pulse 114  Resp 20  Ht 5\' 10"  (1.778 m)  Wt 198 lb (89.812 kg)  BMI 28.41 kg/m2  SpO2 98% Physical Exam Appears weaker since his last visit, anxious Lungs with diminished breath sounds right base Pleurx catheter site intact and  dry Heart rhythm increased Tenderness No JVD the neck  Diagnostic Tests: Ultrasound of legs negative for DVT  Chest x-ray good position of Pleurx catheter, persistent collapse of right lower lobe from cancer.  Impression: Continue Pleurx catheter drainage twice a week.  Plan: Follow up with chest x-ray in one month.

## 2013-03-31 ENCOUNTER — Telehealth: Payer: Self-pay | Admitting: Oncology

## 2013-03-31 ENCOUNTER — Telehealth: Payer: Self-pay | Admitting: *Deleted

## 2013-03-31 ENCOUNTER — Ambulatory Visit (HOSPITAL_BASED_OUTPATIENT_CLINIC_OR_DEPARTMENT_OTHER): Payer: Medicaid Other

## 2013-03-31 ENCOUNTER — Other Ambulatory Visit (HOSPITAL_BASED_OUTPATIENT_CLINIC_OR_DEPARTMENT_OTHER): Payer: Medicaid Other | Admitting: Lab

## 2013-03-31 ENCOUNTER — Ambulatory Visit (HOSPITAL_BASED_OUTPATIENT_CLINIC_OR_DEPARTMENT_OTHER): Payer: Medicaid Other | Admitting: Oncology

## 2013-03-31 DIAGNOSIS — R5381 Other malaise: Secondary | ICD-10-CM

## 2013-03-31 DIAGNOSIS — C801 Malignant (primary) neoplasm, unspecified: Secondary | ICD-10-CM

## 2013-03-31 DIAGNOSIS — Z5111 Encounter for antineoplastic chemotherapy: Secondary | ICD-10-CM

## 2013-03-31 DIAGNOSIS — J449 Chronic obstructive pulmonary disease, unspecified: Secondary | ICD-10-CM

## 2013-03-31 DIAGNOSIS — C3490 Malignant neoplasm of unspecified part of unspecified bronchus or lung: Secondary | ICD-10-CM

## 2013-03-31 DIAGNOSIS — J910 Malignant pleural effusion: Secondary | ICD-10-CM

## 2013-03-31 DIAGNOSIS — K219 Gastro-esophageal reflux disease without esophagitis: Secondary | ICD-10-CM

## 2013-03-31 DIAGNOSIS — K228 Other specified diseases of esophagus: Secondary | ICD-10-CM

## 2013-03-31 LAB — CBC WITH DIFFERENTIAL/PLATELET
Basophils Absolute: 0 10*3/uL (ref 0.0–0.1)
Eosinophils Absolute: 0 10*3/uL (ref 0.0–0.5)
HCT: 38.5 % (ref 38.4–49.9)
HGB: 11.7 g/dL — ABNORMAL LOW (ref 13.0–17.1)
LYMPH%: 35.5 % (ref 14.0–49.0)
MCV: 86.3 fL (ref 79.3–98.0)
MONO#: 0.7 10*3/uL (ref 0.1–0.9)
MONO%: 11.3 % (ref 0.0–14.0)
NEUT#: 3.5 10*3/uL (ref 1.5–6.5)
NEUT%: 52.8 % (ref 39.0–75.0)
Platelets: 178 10*3/uL (ref 140–400)
RBC: 4.46 10*6/uL (ref 4.20–5.82)

## 2013-03-31 LAB — COMPREHENSIVE METABOLIC PANEL (CC13)
BUN: 14.5 mg/dL (ref 7.0–26.0)
CO2: 24 mEq/L (ref 22–29)
Calcium: 8.7 mg/dL (ref 8.4–10.4)
Chloride: 108 mEq/L (ref 98–109)
Creatinine: 0.7 mg/dL (ref 0.7–1.3)
Glucose: 127 mg/dl (ref 70–140)
Total Bilirubin: 0.35 mg/dL (ref 0.20–1.20)

## 2013-03-31 MED ORDER — SODIUM CHLORIDE 0.9 % IV SOLN
Freq: Once | INTRAVENOUS | Status: DC
Start: 2013-03-31 — End: 2013-03-31

## 2013-03-31 MED ORDER — ONDANSETRON 16 MG/50ML IVPB (CHCC)
16.0000 mg | Freq: Once | INTRAVENOUS | Status: AC
  Administered 2013-03-31: 16 mg via INTRAVENOUS

## 2013-03-31 MED ORDER — SODIUM CHLORIDE 0.9 % IV SOLN
750.0000 mg | Freq: Once | INTRAVENOUS | Status: AC
  Administered 2013-03-31: 750 mg via INTRAVENOUS
  Filled 2013-03-31: qty 75

## 2013-03-31 MED ORDER — DEXAMETHASONE SODIUM PHOSPHATE 20 MG/5ML IJ SOLN
20.0000 mg | Freq: Once | INTRAMUSCULAR | Status: AC
  Administered 2013-03-31: 20 mg via INTRAVENOUS

## 2013-03-31 MED ORDER — DOCETAXEL CHEMO INJECTION 160 MG/16ML
70.0000 mg/m2 | Freq: Once | INTRAVENOUS | Status: AC
  Administered 2013-03-31: 150 mg via INTRAVENOUS
  Filled 2013-03-31: qty 15

## 2013-03-31 NOTE — Progress Notes (Signed)
Hematology and Oncology Follow Up Visit  Geoffrey Richardson [id number] [birth year] 52 y.o. 03/31/2013 12:15 PM Geoffrey Richardson, MDShadad, [name], MD   Principle Diagnosis: 52 year old gentleman with diagnosis of malignant pleural effusion noted in May of 2014. At that time he presented with weakness, right chest pain and found to have pleural effusion. The cytology is suggesting malignant effusion possibly non-small cell cancer.  Prior Therapy: Patient is status post pleural catheter insertion that was done on 01/15/2013.  Current therapy: Started systemic chemotherapy with Carboplatin and Taxotere on 02/17/13. He is here for cycle 3.   Interim History: Geoffrey Richardson is a pleasant 52 year old gentleman  presented with malignant pleural effusion status post thoracentesis with the cytology revealing non-small cell cancer histology. Received his last chemotherapy without complications. He still oxygen dependent (2-3L/min) and has improved mobility. He still has pain to his Pleurex catheter site and controlled now with pain medications.  He is eating better and has gained weight. He still have a pleural effusion and states that he drains less but still needed twice a week. He is mobile at home but he fatigues pretty easy and does report some occasional dyspnea on exertion.  Medications: I have reviewed the patient's current medications.  Current Outpatient Prescriptions  Medication Sig Dispense Refill  . albuterol (PROVENTIL HFA;VENTOLIN HFA) 108 (90 BASE) MCG/ACT inhaler Inhale 2 puffs into the lungs every 4 (four) hours as needed for wheezing or shortness of breath.  1 Inhaler  1  . budesonide-formoterol (SYMBICORT) 160-4.5 MCG/ACT inhaler Inhale 2 puffs into the lungs 2 (two) times daily.  1 Inhaler  1  . feeding supplement (ENSURE COMPLETE) LIQD Take 237 mLs by mouth 3 (three) times daily with meals.      Marland Kitchen HYDROcodone-acetaminophen (NORCO/VICODIN) 5-325 MG per tablet Take 1-2 tablets by mouth every 6 (six)  hours as needed for pain.  50 tablet  0  . metoprolol tartrate (LOPRESSOR) 25 MG tablet Take 1 tablet (25 mg total) by mouth 2 (two) times daily.  60 tablet  1  . ondansetron (ZOFRAN) 8 MG tablet Take 1 tablet (8 mg total) by mouth every 8 (eight) hours as needed for nausea.  20 tablet  2  . pantoprazole (PROTONIX) 40 MG tablet Take 1 tablet (40 mg total) by mouth at bedtime.  30 tablet  2  . prochlorperazine (COMPAZINE) 10 MG tablet Take 1 tablet (10 mg total) by mouth every 6 (six) hours as needed.  60 tablet  1   No current facility-administered medications for this visit.     Allergies: No Known Allergies  Past Medical History, Surgical history, Social history, and Family History were reviewed and updated.  Review of Systems:  Remaining ROS negative.  Physical Exam: Blood pressure 123/88, pulse 98, temperature 98.3 F (36.8 C), temperature source Oral, resp. rate 18, height 5\' 10"  (1.778 m), weight 203 lb 12.8 oz (92.443 kg). ECOG: 1 General appearance: alert Head: Normocephalic, without obvious abnormality, atraumatic Neck: no adenopathy, no carotid bruit, no JVD, supple, symmetrical, trachea midline and thyroid not enlarged, symmetric, no tenderness/mass/nodules Lymph nodes: Cervical, supraclavicular, and axillary nodes normal. Heart:regular rate and rhythm, S1, S2 normal, no murmur, click, rub or gallop Lung:chest clear, no wheezing, rales, normal symmetric air entry. He has decreased breath sounds on the right. Abdomen: soft, non-tender, without masses or organomegaly EXT:no erythema, induration, or nodules   Lab Results: Lab Results  Component Value Date   WBC 6.6 03/31/2013   HGB 11.7* 03/31/2013   HCT 38.5 03/31/2013  MCV 86.3 03/31/2013   PLT 178 03/31/2013     Chemistry      Component Value Date/Time   NA 143 03/10/2013 1222   NA 137 01/17/2013 0926   K 3.7 03/10/2013 1222   K 3.8 01/17/2013 0926   CL 103 01/19/2013 1008   CL 97 01/17/2013 0926   CO2 25 03/10/2013  1222   CO2 29 01/17/2013 0926   BUN 11.0 03/10/2013 1222   BUN 17 01/17/2013 0926   CREATININE 0.7 03/10/2013 1222   CREATININE 0.70 01/17/2013 0926      Component Value Date/Time   CALCIUM 9.0 03/10/2013 1222   CALCIUM 8.7 01/17/2013 0926   ALKPHOS 136 03/10/2013 1222   ALKPHOS 93 01/16/2013 0600   AST 13 03/10/2013 1222   AST 13 01/16/2013 0600   ALT 20 03/10/2013 1222   ALT 18 01/16/2013 0600   BILITOT 0.20 03/10/2013 1222   BILITOT 0.3 01/16/2013 0600      Impression and Plan: 52 year old with the following issues:   1. 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 He is status post pleural catheter inserted on 01/15/2013. He is currently receiving systemic chemotherapy with Carboplatin and Taxotere. He has grade 1-2 fatigue and developed grade 1 nausea/vomiting after the first cycle but better after the second. Recommend that he proceed with cycle 3 of chemo today without dose modification. I will repeat CT scan before cycle 4.   2. Esophageal focal narrowing . I doubt this is malignancy. He is asymptomatic at this point. A PET/CT scan failed to show anything to suggest esophageal malignancy.  3. Nausea/vomiting prophylaxis. I have refilled his Compazine and have given him Zofran as well.   4. GERD. On Protonix.  5. COPD: He is on Albuterol PRN. I have refilled his Symbicort as he is completely out of this medication. On O2 2-3 L.  6. HTN. BP better today.  7. Pain control. He has pain due to the Pleurex catheter. On Vicodin now.   8. Neutropenia prophylaxis. He receives Neulasta with each chemo.   9. Follow-up. In 3 weeks.   Geoffrey Richardson 8/21/201412:15 PM

## 2013-03-31 NOTE — Telephone Encounter (Signed)
gave pt appt for lab, injections and Md, emailed Marcelino Duster chemo , gave pt oral contrast for CT for September 2014

## 2013-03-31 NOTE — Telephone Encounter (Signed)
Per staff message and POF I have scheduled appts.  JMW  

## 2013-03-31 NOTE — Patient Instructions (Addendum)
[facility] Discharge Instructions for Patients Receiving Chemotherapy  Today you received the following chemotherapy agents Taxotere and carboplatin To help prevent nausea and vomiting after your treatment, we encourage you to take your nausea medication as prescribed.   If you develop nausea and vomiting that is not controlled by your nausea medication, call the clinic.   BELOW ARE SYMPTOMS THAT SHOULD BE REPORTED IMMEDIATELY:  *FEVER GREATER THAN 100.5 F  *CHILLS WITH OR WITHOUT FEVER  NAUSEA AND VOMITING THAT IS NOT CONTROLLED WITH YOUR NAUSEA MEDICATION  *UNUSUAL SHORTNESS OF BREATH  *UNUSUAL BRUISING OR BLEEDING  TENDERNESS IN MOUTH AND THROAT WITH OR WITHOUT PRESENCE OF ULCERS  *URINARY PROBLEMS  *BOWEL PROBLEMS  UNUSUAL RASH Items with * indicate a potential emergency and should be followed up as soon as possible.  Feel free to call the clinic you have any questions or concerns. The clinic phone number is [phone].

## 2013-04-01 ENCOUNTER — Ambulatory Visit (HOSPITAL_BASED_OUTPATIENT_CLINIC_OR_DEPARTMENT_OTHER): Payer: Medicaid Other

## 2013-04-01 ENCOUNTER — Telehealth: Payer: Self-pay | Admitting: Oncology

## 2013-04-01 DIAGNOSIS — C801 Malignant (primary) neoplasm, unspecified: Secondary | ICD-10-CM

## 2013-04-01 DIAGNOSIS — J910 Malignant pleural effusion: Secondary | ICD-10-CM

## 2013-04-01 MED ORDER — PEGFILGRASTIM INJECTION 6 MG/0.6ML
6.0000 mg | Freq: Once | SUBCUTANEOUS | Status: AC
  Administered 2013-04-01: 6 mg via SUBCUTANEOUS
  Filled 2013-04-01: qty 0.6

## 2013-04-01 NOTE — Telephone Encounter (Signed)
Gave pt appt for lab, Md, chemo and injections for September 2014

## 2013-04-06 DIAGNOSIS — J90 Pleural effusion, not elsewhere classified: Secondary | ICD-10-CM

## 2013-04-18 ENCOUNTER — Ambulatory Visit (INDEPENDENT_AMBULATORY_CARE_PROVIDER_SITE_OTHER): Payer: Medicaid Other | Admitting: Physician Assistant

## 2013-04-18 VITALS — BP 112/78 | HR 117 | Resp 18 | Ht 72.0 in | Wt 203.0 lb

## 2013-04-18 DIAGNOSIS — L0291 Cutaneous abscess, unspecified: Secondary | ICD-10-CM

## 2013-04-18 DIAGNOSIS — Z09 Encounter for follow-up examination after completed treatment for conditions other than malignant neoplasm: Secondary | ICD-10-CM

## 2013-04-18 DIAGNOSIS — L0390 Cellulitis, unspecified: Secondary | ICD-10-CM

## 2013-04-18 DIAGNOSIS — J910 Malignant pleural effusion: Secondary | ICD-10-CM

## 2013-04-18 MED ORDER — CEPHALEXIN 500 MG PO CAPS: 500.0000 mg | ORAL_CAPSULE | Freq: Three times a day (TID) | ORAL | Status: DC

## 2013-04-18 NOTE — Progress Notes (Signed)
       [address]       Geoffrey Richardson [address]             [phone]          HPI: Patient presents today for evaluation of his right PleuRx site.  He has been having the catheter drained on Tuesdays and Thursdays, yielding 200-300 ml each time.  On Friday, he noted increasing soreness around the site, and after a coughing spell, developed serosanguinous drainage around the catheter.  This has occurred intermittently since that time, and the drainage saturated the dressing today.  The home health RN drained the catheter this afternoon of about 225 ml, and noticed the area was red and hot and called our office.  He denies fever but has had some chills, as well as greenish phlegm when he coughs.    Current Outpatient Prescriptions  Medication Sig Dispense Refill  . albuterol (PROVENTIL HFA;VENTOLIN HFA) 108 (90 BASE) MCG/ACT inhaler Inhale 2 puffs into the lungs every 4 (four) hours as needed for wheezing or shortness of breath.  1 Inhaler  1  . budesonide-formoterol (SYMBICORT) 160-4.5 MCG/ACT inhaler Inhale 2 puffs into the lungs 2 (two) times daily.  1 Inhaler  1  . feeding supplement (ENSURE COMPLETE) LIQD Take 237 mLs by mouth 3 (three) times daily with meals.      Marland Kitchen HYDROcodone-acetaminophen (NORCO/VICODIN) 5-325 MG per tablet Take 1-2 tablets by mouth every 6 (six) hours as needed for pain.  50 tablet  0  . pantoprazole (PROTONIX) 40 MG tablet Take 1 tablet (40 mg total) by mouth at bedtime.  30 tablet  2  . prochlorperazine (COMPAZINE) 10 MG tablet Take 1 tablet (10 mg total) by mouth every 6 (six) hours as needed.  60 tablet  1  . cephALEXin (KEFLEX) 500 MG capsule Take 1 capsule (500 mg total) by mouth 3 (three) times daily.  21 capsule  0  . metoprolol tartrate (LOPRESSOR) 25 MG tablet Take 1 tablet (25 mg total) by mouth 2 (two) times daily.  60 tablet  1  . ondansetron (ZOFRAN) 8 MG tablet Take 1 tablet (8 mg total) by mouth every 8 (eight) hours as needed for  nausea.  20 tablet  2   No current facility-administered medications for this visit.     Physical Exam: BP 112/78 HR 117 Resp 18 Wounds: PleuRx catheter in place on R, +surrounding erythema at the insertion site with tenderness and firmness to palpation.  No drainage expressible or noted at this time. No fluctuance. Heart: regular rate and rhythm Lungs:Decreased BS R base   Diagnostic Tests: Chest xray: No results found.     Assessment/Plan: He appears to have a cellulitis around the PleuRx insertion site, so I have placed him on Keflex 500 mg tid.  I was unable to express any drainage from the area on exam, but I suspect there may have been fluid collecting in the tract and emptied when he coughed.  We will plan to keep his scheduled appointment with Dr. Donata Clay on Wednesday for a recheck, and he will call in the interim if he has any problems.

## 2013-04-20 ENCOUNTER — Encounter (HOSPITAL_COMMUNITY): Payer: Self-pay

## 2013-04-20 ENCOUNTER — Other Ambulatory Visit: Payer: Self-pay | Admitting: *Deleted

## 2013-04-20 ENCOUNTER — Ambulatory Visit: Payer: Self-pay | Admitting: Cardiothoracic Surgery

## 2013-04-20 ENCOUNTER — Other Ambulatory Visit (HOSPITAL_BASED_OUTPATIENT_CLINIC_OR_DEPARTMENT_OTHER): Payer: Medicaid Other

## 2013-04-20 ENCOUNTER — Other Ambulatory Visit (HOSPITAL_COMMUNITY): Payer: Medicaid Other

## 2013-04-20 ENCOUNTER — Ambulatory Visit (HOSPITAL_COMMUNITY)
Admission: RE | Admit: 2013-04-20 | Discharge: 2013-04-20 | Disposition: A | Discharge status: Home / Self Care | Payer: Medicaid Other | Source: Ambulatory Visit | Attending: Oncology | Admitting: Oncology

## 2013-04-20 DIAGNOSIS — C3490 Malignant neoplasm of unspecified part of unspecified bronchus or lung: Secondary | ICD-10-CM | POA: Insufficient documentation

## 2013-04-20 DIAGNOSIS — R599 Enlarged lymph nodes, unspecified: Secondary | ICD-10-CM | POA: Insufficient documentation

## 2013-04-20 DIAGNOSIS — N281 Cyst of kidney, acquired: Secondary | ICD-10-CM | POA: Insufficient documentation

## 2013-04-20 DIAGNOSIS — N200 Calculus of kidney: Secondary | ICD-10-CM | POA: Insufficient documentation

## 2013-04-20 DIAGNOSIS — Z79899 Other long term (current) drug therapy: Secondary | ICD-10-CM | POA: Insufficient documentation

## 2013-04-20 DIAGNOSIS — K4090 Unilateral inguinal hernia, without obstruction or gangrene, not specified as recurrent: Secondary | ICD-10-CM | POA: Insufficient documentation

## 2013-04-20 DIAGNOSIS — I700 Atherosclerosis of aorta: Secondary | ICD-10-CM | POA: Insufficient documentation

## 2013-04-20 DIAGNOSIS — J90 Pleural effusion, not elsewhere classified: Secondary | ICD-10-CM | POA: Insufficient documentation

## 2013-04-20 DIAGNOSIS — C801 Malignant (primary) neoplasm, unspecified: Secondary | ICD-10-CM

## 2013-04-20 LAB — CBC WITH DIFFERENTIAL/PLATELET
BASO%: 0.6 % (ref 0.0–2.0)
Eosinophils Absolute: 0 10*3/uL (ref 0.0–0.5)
HCT: 31.4 % — ABNORMAL LOW (ref 38.4–49.9)
LYMPH%: 14.4 % (ref 14.0–49.0)
MONO#: 1.2 10*3/uL — ABNORMAL HIGH (ref 0.1–0.9)
NEUT#: 9.6 10*3/uL — ABNORMAL HIGH (ref 1.5–6.5)
NEUT%: 75.5 % — ABNORMAL HIGH (ref 39.0–75.0)
Platelets: 319 10*3/uL (ref 140–400)
WBC: 12.8 10*3/uL — ABNORMAL HIGH (ref 4.0–10.3)
lymph#: 1.8 10*3/uL (ref 0.9–3.3)

## 2013-04-20 LAB — COMPREHENSIVE METABOLIC PANEL (CC13)
ALT: 82 U/L — ABNORMAL HIGH (ref 0–55)
CO2: 28 mEq/L (ref 22–29)
Calcium: 8.9 mg/dL (ref 8.4–10.4)
Chloride: 103 mEq/L (ref 98–109)
Creatinine: 0.7 mg/dL (ref 0.7–1.3)
Glucose: 100 mg/dl (ref 70–140)
Sodium: 140 mEq/L (ref 136–145)
Total Bilirubin: 0.27 mg/dL (ref 0.20–1.20)
Total Protein: 7.9 g/dL (ref 6.4–8.3)

## 2013-04-20 MED ORDER — IOHEXOL 300 MG/ML  SOLN
100.0000 mL | Freq: Once | INTRAMUSCULAR | Status: AC | PRN
  Administered 2013-04-20: 100 mL via INTRAVENOUS

## 2013-04-21 ENCOUNTER — Telehealth: Payer: Self-pay | Admitting: Oncology

## 2013-04-21 ENCOUNTER — Ambulatory Visit (HOSPITAL_BASED_OUTPATIENT_CLINIC_OR_DEPARTMENT_OTHER): Payer: Medicaid Other | Admitting: Oncology

## 2013-04-21 ENCOUNTER — Telehealth: Payer: Self-pay | Admitting: *Deleted

## 2013-04-21 ENCOUNTER — Ambulatory Visit (HOSPITAL_BASED_OUTPATIENT_CLINIC_OR_DEPARTMENT_OTHER): Payer: Medicaid Other

## 2013-04-21 DIAGNOSIS — J910 Malignant pleural effusion: Secondary | ICD-10-CM

## 2013-04-21 DIAGNOSIS — K228 Other specified diseases of esophagus: Secondary | ICD-10-CM

## 2013-04-21 DIAGNOSIS — Z9981 Dependence on supplemental oxygen: Secondary | ICD-10-CM

## 2013-04-21 DIAGNOSIS — J449 Chronic obstructive pulmonary disease, unspecified: Secondary | ICD-10-CM

## 2013-04-21 DIAGNOSIS — Z5111 Encounter for antineoplastic chemotherapy: Secondary | ICD-10-CM

## 2013-04-21 MED ORDER — DEXAMETHASONE SODIUM PHOSPHATE 20 MG/5ML IJ SOLN: 20.0000 mg | Freq: Once | INTRAMUSCULAR | Status: AC

## 2013-04-21 MED ORDER — ONDANSETRON 16 MG/50ML IVPB (CHCC)
INTRAVENOUS | Status: AC
  Administered 2013-04-21: 16 mg via INTRAVENOUS
  Filled 2013-04-21: qty 16

## 2013-04-21 MED ORDER — SODIUM CHLORIDE 0.9 % IV SOLN
750.0000 mg | Freq: Once | INTRAVENOUS | Status: AC
  Administered 2013-04-21: 750 mg via INTRAVENOUS
  Filled 2013-04-21: qty 75

## 2013-04-21 MED ORDER — SODIUM CHLORIDE 0.9 % IJ SOLN
10.0000 mL | INTRAMUSCULAR | Status: DC | PRN
  Filled 2013-04-21: qty 10

## 2013-04-21 MED ORDER — SODIUM CHLORIDE 0.9 % IV SOLN
Freq: Once | INTRAVENOUS | Status: AC
  Administered 2013-04-21: 10:00:00 via INTRAVENOUS

## 2013-04-21 MED ORDER — HEPARIN SOD (PORK) LOCK FLUSH 100 UNIT/ML IV SOLN
500.0000 [IU] | Freq: Once | INTRAVENOUS | Status: DC | PRN
  Filled 2013-04-21: qty 5

## 2013-04-21 MED ORDER — ONDANSETRON 16 MG/50ML IVPB (CHCC): 16.0000 mg | Freq: Once | INTRAVENOUS | Status: AC

## 2013-04-21 MED ORDER — DOCETAXEL CHEMO INJECTION 160 MG/16ML
70.0000 mg/m2 | Freq: Once | INTRAVENOUS | Status: AC
  Administered 2013-04-21: 150 mg via INTRAVENOUS
  Filled 2013-04-21: qty 15

## 2013-04-21 MED ORDER — HYDROCODONE-ACETAMINOPHEN 5-325 MG PO TABS: 1.0000 | ORAL_TABLET | Freq: Four times a day (QID) | ORAL | Status: DC | PRN

## 2013-04-21 MED ORDER — DEXAMETHASONE SODIUM PHOSPHATE 20 MG/5ML IJ SOLN
INTRAMUSCULAR | Status: AC
  Administered 2013-04-21: 20 mg via INTRAVENOUS
  Filled 2013-04-21: qty 5

## 2013-04-21 NOTE — Telephone Encounter (Signed)
gv adn printed appt sched and avs for opt for OCT...MW added tx.

## 2013-04-21 NOTE — Patient Instructions (Addendum)
[facility] Discharge Instructions for Patients Receiving Chemotherapy  Today you received the following chemotherapy agents: taxol, carboplatin  To help prevent nausea and vomiting after your treatment, we encourage you to take your nausea medication.  Take it as often as prescribed.     If you develop nausea and vomiting that is not controlled by your nausea medication, call the clinic. If it is after clinic hours your family physician or the after hours number for the clinic or go to the Emergency Department.   BELOW ARE SYMPTOMS THAT SHOULD BE REPORTED IMMEDIATELY:  *FEVER GREATER THAN 100.5 F  *CHILLS WITH OR WITHOUT FEVER  NAUSEA AND VOMITING THAT IS NOT CONTROLLED WITH YOUR NAUSEA MEDICATION  *UNUSUAL SHORTNESS OF BREATH  *UNUSUAL BRUISING OR BLEEDING  TENDERNESS IN MOUTH AND THROAT WITH OR WITHOUT PRESENCE OF ULCERS  *URINARY PROBLEMS  *BOWEL PROBLEMS  UNUSUAL RASH Items with * indicate a potential emergency and should be followed up as soon as possible.  Feel free to call the clinic you have any questions or concerns. The clinic phone number is [phone].   I have been informed and understand all the instructions given to me. I know to contact the clinic, my physician, or go to the Emergency Department if any problems should occur. I do not have any questions at this time, but understand that I may call the clinic during office hours   should I have any questions or need assistance in obtaining follow up care.    __________________________________________  _____________  __________ Signature of Patient or Authorized Representative            Date                   Time    __________________________________________ Nurse's Signature    

## 2013-04-21 NOTE — Progress Notes (Signed)
Hematology and Oncology Follow Up Visit  Geoffrey Richardson [id number] Feb 06, 1961 52 y.o. 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 At that time he presented with weakness, right chest pain and found to have pleural effusion. The cytology is suggesting malignant effusion possibly non-small cell cancer.  Prior Therapy: Patient is status post pleural catheter insertion that was done on 01/15/2013.  Current therapy: Started systemic chemotherapy with Carboplatin and Taxotere on 02/17/13. He is here for cycle 4.   Interim History: Geoffrey Richardson is a pleasant 52 year old gentleman  presented with malignant pleural effusion status post thoracentesis with the cytology revealing non-small cell cancer histology. He received his last chemotherapy without complications. He still oxygen dependent (2-3L/min) and has improved mobility. He still has pain to his Pleurex catheter site and controlled now with pain medications. His drainage catheter have been bothering him more recently with more irritation and drainage around the site. He was diagnosed with an infection and was prescribed antibiotics which she is currently on. He is not reporting any fevers or chills. Has not reported any change in his respiratory status. He is eating better but weight have been erratic with loss of a few pounds since last visit.Marland Kitchen He still have a pleural effusion and states that he drains less but still needed twice a week. He is mobile at home but he fatigues pretty easy and does report some occasional dyspnea on exertion. He has not reported any new complications from chemotherapy. He denied peripheral neuropathy or infusion-related complications. He has not reported any nail changes.  Medications: I have reviewed the patient's current medications.  Current Outpatient Prescriptions  Medication Sig Dispense Refill  .  albuterol (PROVENTIL HFA;VENTOLIN HFA) 108 (90 BASE) MCG/ACT inhaler Inhale 2 puffs into the lungs every 4 (four) hours as needed for wheezing or shortness of breath.  1 Inhaler  1  . budesonide-formoterol (SYMBICORT) 160-4.5 MCG/ACT inhaler Inhale 2 puffs into the lungs 2 (two) times daily.  1 Inhaler  1  . cephALEXin (KEFLEX) 500 MG capsule Take 1 capsule (500 mg total) by mouth 3 (three) times daily.  21 capsule  0  . feeding supplement (ENSURE COMPLETE) LIQD Take 237 mLs by mouth 3 (three) times daily with meals.      Marland Kitchen HYDROcodone-acetaminophen (NORCO/VICODIN) 5-325 MG per tablet Take 1-2 tablets by mouth every 6 (six) hours as needed for pain.  50 tablet  0  . metoprolol tartrate (LOPRESSOR) 25 MG tablet Take 1 tablet (25 mg total) by mouth 2 (two) times daily.  60 tablet  1  . ondansetron (ZOFRAN) 8 MG tablet Take 1 tablet (8 mg total) by mouth every 8 (eight) hours as needed for nausea.  20 tablet  2  . pantoprazole (PROTONIX) 40 MG tablet Take 1 tablet (40 mg total) by mouth at bedtime.  30 tablet  2  . prochlorperazine (COMPAZINE) 10 MG tablet Take 1 tablet (10 mg total) by mouth every 6 (six) hours as needed.  60 tablet  1   No current facility-administered medications for this visit.     Allergies: No Known Allergies  Past Medical History, Surgical history, Social history, and Family History were reviewed and updated.  Review of Systems:  The remaining ROS negative.  Physical Exam: Blood pressure 104/76, pulse 113, temperature 98.3 F (36.8 C), temperature source Oral, resp. rate 18, height 6' (1.829 m), weight 198 lb 3.2 oz (89.903  kg), SpO2 98.00%. ECOG: 1 General appearance: alert Head: Normocephalic, without obvious abnormality, atraumatic Neck: no adenopathy, no carotid bruit, no JVD, supple, symmetrical, trachea midline and thyroid not enlarged, symmetric, no tenderness/mass/nodules Lymph nodes: Cervical, supraclavicular, and axillary nodes normal. Heart:regular rate  and rhythm, S1, S2 normal, no murmur, click, rub or gallop Lung:chest clear, no wheezing, rales, normal symmetric air entry. He has decreased breath sounds on the right. Abdomen: soft, non-tender, without masses or organomegaly EXT:no erythema, induration, or nodules Neurological: No deficits motor sensory or proprioception. No change in his gait or mentation.  Lab Results: Lab Results  Component Value Date   WBC 12.8* 04/20/2013   HGB 9.8* 04/20/2013   HCT 31.4* 04/20/2013   MCV 85.5 04/20/2013   PLT 319 04/20/2013     Chemistry      Component Value Date/Time   NA 140 04/20/2013 1415   NA 137 01/17/2013 0926   K 4.2 04/20/2013 1415   K 3.8 01/17/2013 0926   CL 103 01/19/2013 1008   CL 97 01/17/2013 0926   CO2 28 04/20/2013 1415   CO2 29 01/17/2013 0926   BUN 12.3 04/20/2013 1415   BUN 17 01/17/2013 0926   CREATININE 0.7 04/20/2013 1415   CREATININE 0.70 01/17/2013 0926      Component Value Date/Time   CALCIUM 8.9 04/20/2013 1415   CALCIUM 8.7 01/17/2013 0926   ALKPHOS 117 04/20/2013 1415   ALKPHOS 93 01/16/2013 0600   AST 36* 04/20/2013 1415   AST 13 01/16/2013 0600   ALT 82* 04/20/2013 1415   ALT 18 01/16/2013 0600   BILITOT 0.27 04/20/2013 1415   BILITOT 0.3 01/16/2013 0600     CT CHEST, ABDOMEN AND PELVIS WITH CONTRAST  Technique: Multidetector CT imaging of the chest, abdomen and  pelvis was performed following the standard protocol during bolus  administration of intravenous contrast.  Contrast: OMNIPAQUE IOHEXOL 300 MG/ML SOLN  Comparison: PET CT dated 01/31/2013  CT CHEST  Findings: Patchy opacity in the posterior right upper lobe (series  5/image 38), mildly increased, with associated right middle and  lower lobe consolidation (series 3/image 43). This is essentially  non-FDG-avid on prior PET and may reflect atelectasis, although  superimposed infection/inflammation is suspected.  Moderate to large right pleural effusion with surrounding thickened  rim, corresponding to known  malignant effusion, mildly decreased.  Associated high density rim suggests prior talc pleurodesis.  Indwelling pleural drain.  Underlying moderate centrilobular and paraseptal emphysematous  changes. No pneumothorax.  Visualized thyroid is unremarkable.  The heart is top normal in size. No pericardial effusion.  Mediastinal lymphadenopathy, including:  --6 mm short-axis node at the right thoracic inlet (series 3/image  19)  --12 mm short-axis high right paratracheal node (series 3/image  28), previously 10 mm  --12 mm short-axis precarinal node (series 3/image 34), previously  10 mm  No focal osseous lesions.  IMPRESSION:  Moderate to large malignant right pleural effusion, mildly  decreased, with indwelling pleural drain.  Mild progression of mediastinal lymphadenopathy, as described  above.  Patchy opacity in the posterior right upper lobe, mildly increased,  with associated right middle and lower lobe atelectasis /  consolidation. While non-FDG-avid on prior PET, superimposed  infection/inflammation is suspected. Underlying tumor is not  excluded.  CT ABDOMEN AND PELVIS  Findings: Mildly motion degraded images.  Liver, spleen, pancreas, and adrenal glands within normal limits.  Gallbladder is underdistended. No intrahepatic or extrahepatic  ductal dilatation.  Small nonobstructing right lower pole renal  calculi measuring up to  3 mm (series 3/image 88). 1.6 cm medial right upper pole renal  cyst (series 3/image 80). Left kidney is unremarkable. No  hydronephrosis.  No evidence of bowel obstruction.  Atherosclerotic calcifications of the abdominal aorta and branch  vessels.  No pelvic ascites.  No suspicious abdominopelvic lymphadenopathy.  Prostate is unremarkable with the visualized.  Both the bladder is underdistended.  Tiny fat-containing left inguinal hernia.  No focal osseous lesions.  IMPRESSION:  Motion degraded images.  No evidence of metastatic disease in the  abdomen/pelvis.   Impression and Plan: 52 year old with the following issues:   1. 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 He is status post pleural catheter inserted on 01/15/2013. He is currently receiving systemic chemotherapy with Carboplatin and Taxotere. He is tolerating it without any major toxicities. CT scan from 04/20/2013 was discussed today and showed a predominantly stable disease with pleural effusion but no clear-cut progression of his disease. The plan at at this point is to  proceed with cycle 4 of chemotherapy today without dose modification. I will repeat CT scan after cycle 6.  2. Esophageal focal narrowing . I doubt this is malignancy. He is asymptomatic at this point. A PET/CT scan failed to show anything to suggest esophageal malignancy.  3. Nausea/vomiting prophylaxis. I have refilled his Compazine and have given him Zofran as well.   4. Pleural effusion with a Pleurx catheter: He is continued to have continuous drainage indicating still active disease. He has a slight infection and currently on antibiotics and followed by thoracic surgery.  5. COPD: He is on Albuterol PRN. I have refilled his Symbicort as he is completely out of this medication. On O2 2-3 L.  6. HTN. BP better today.  7. Pain control. He has pain due to the Pleurex catheter. On Vicodin now. Have refilled this medication for him today.  8. Neutropenia prophylaxis. He receives Neulasta with each chemo.   9. Follow-up. In 3 weeks.   Analis Distler 9/11/20149:44 AM

## 2013-04-21 NOTE — Telephone Encounter (Signed)
Per staff message and POF I have scheduled appts.  JMW  

## 2013-04-22 ENCOUNTER — Encounter (HOSPITAL_COMMUNITY): Payer: Self-pay | Admitting: *Deleted

## 2013-04-22 ENCOUNTER — Ambulatory Visit (HOSPITAL_COMMUNITY)
Admission: AD | Admit: 2013-04-22 | Discharge: 2013-04-22 | Disposition: A | Discharge status: Home / Self Care | Payer: Medicaid Other | Source: Ambulatory Visit | Attending: Cardiothoracic Surgery | Admitting: Cardiothoracic Surgery

## 2013-04-22 ENCOUNTER — Ambulatory Visit
Admission: RE | Admit: 2013-04-22 | Discharge: 2013-04-22 | Disposition: A | Discharge status: Home / Self Care | Payer: Medicaid Other | Source: Ambulatory Visit | Attending: Surgery | Admitting: Surgery

## 2013-04-22 ENCOUNTER — Ambulatory Visit (HOSPITAL_BASED_OUTPATIENT_CLINIC_OR_DEPARTMENT_OTHER): Payer: Medicaid Other

## 2013-04-22 ENCOUNTER — Ambulatory Visit (INDEPENDENT_AMBULATORY_CARE_PROVIDER_SITE_OTHER): Payer: Medicaid Other | Admitting: Cardiothoracic Surgery

## 2013-04-22 ENCOUNTER — Other Ambulatory Visit: Payer: Self-pay | Admitting: *Deleted

## 2013-04-22 ENCOUNTER — Encounter: Payer: Self-pay | Admitting: Cardiothoracic Surgery

## 2013-04-22 ENCOUNTER — Ambulatory Visit: Payer: Medicaid Other | Admitting: Oncology

## 2013-04-22 ENCOUNTER — Other Ambulatory Visit: Payer: Self-pay

## 2013-04-22 ENCOUNTER — Encounter (HOSPITAL_COMMUNITY)
Admission: AD | Disposition: A | Discharge status: Home / Self Care | Payer: Self-pay | Source: Ambulatory Visit | Attending: Cardiothoracic Surgery

## 2013-04-22 VITALS — BP 123/82 | HR 106 | Resp 18 | Ht 72.0 in | Wt 198.0 lb

## 2013-04-22 DIAGNOSIS — J90 Pleural effusion, not elsewhere classified: Secondary | ICD-10-CM | POA: Insufficient documentation

## 2013-04-22 DIAGNOSIS — Y849 Medical procedure, unspecified as the cause of abnormal reaction of the patient, or of later complication, without mention of misadventure at the time of the procedure: Secondary | ICD-10-CM | POA: Insufficient documentation

## 2013-04-22 DIAGNOSIS — L0390 Cellulitis, unspecified: Secondary | ICD-10-CM

## 2013-04-22 DIAGNOSIS — Z09 Encounter for follow-up examination after completed treatment for conditions other than malignant neoplasm: Secondary | ICD-10-CM

## 2013-04-22 DIAGNOSIS — C801 Malignant (primary) neoplasm, unspecified: Secondary | ICD-10-CM

## 2013-04-22 DIAGNOSIS — J910 Malignant pleural effusion: Secondary | ICD-10-CM

## 2013-04-22 DIAGNOSIS — Z5189 Encounter for other specified aftercare: Secondary | ICD-10-CM

## 2013-04-22 DIAGNOSIS — T8579XA Infection and inflammatory reaction due to other internal prosthetic devices, implants and grafts, initial encounter: Secondary | ICD-10-CM | POA: Insufficient documentation

## 2013-04-22 HISTORY — PX: REMOVAL OF PLEURAL DRAINAGE CATHETER: SHX5080

## 2013-04-22 SURGERY — MINOR REMOVAL OF PLEURAL DRAINAGE CATHETER
Anesthesia: General

## 2013-04-22 MED ORDER — CEPHALEXIN 500 MG PO CAPS: 500.0000 mg | ORAL_CAPSULE | Freq: Four times a day (QID) | ORAL | Status: DC

## 2013-04-22 MED ORDER — LIDOCAINE HCL (PF) 2 % IJ SOLN
INTRAMUSCULAR | Status: DC | PRN
  Administered 2013-04-22: 10 mL

## 2013-04-22 MED ORDER — PEGFILGRASTIM INJECTION 6 MG/0.6ML
6.0000 mg | Freq: Once | SUBCUTANEOUS | Status: AC
  Administered 2013-04-22: 6 mg via SUBCUTANEOUS
  Filled 2013-04-22: qty 0.6

## 2013-04-22 NOTE — Progress Notes (Signed)
PCP is [name],FIRAS, MD Referring Provider is Parrett, Virgel Bouquet, NP  Chief Complaint  Patient presents with  . Routine Post Op    management of right pleurX....SITE SL. RED AND LEAKING...Marland Kitchensaw the P.A. earlier this week and an antibiotic was prescribed    HPI: Pleurx catheter placed 3 months ago for malignant effusion advanced age non-small cell carcinoma. Patient placed on oral antibiotics recently for cellulitis around catheter site with some drainage around the catheter. CT scan shows catheter to be in excellent position but with significant loculated malignant effusion remaining. Drainage sessions typically 200 250 cc serosanguineous fluid twice a week.  Patient recently finished fourth cycle of chemotherapy for his lung cancer.  On exam there is tenderness around the exit site some mild drainage and redness as well. Breath sounds diminished at right base. Left lung clear.   Past Medical History  Diagnosis Date  . Tobacco abuse     Began age 4  . Pleural effusion on right   . COPD (chronic obstructive pulmonary disease)   . Hypertension     pt states it fluctuates but not on any medications  . Emphysema   . Shortness of breath     lying/sitting/exertion  . Headache(784.0)     occasionally  . GERD (gastroesophageal reflux disease)     takes Protonix daily  . Nocturia   . Hypothyroidism     but not on medication at the present time  . Insomnia   . Lung cancer 02/04/2013    Past Surgical History  Procedure Laterality Date  . Appendectomy      as a child  . Video assisted thoracoscopy (vats) w/talc pleuadesis Right 01/14/2013    Procedure: VIDEO ASSISTED THORACOSCOPY (VATS) W/TALC PLEUADESIS;  Surgeon: Kerin Perna, MD;  Location: [facility] OR;  Service: Thoracic;  Laterality: Right;  . Chest tube insertion Right 01/14/2013    Procedure: INSERTION PLEURAL DRAINAGE CATHETER;  Surgeon: Kerin Perna, MD;  Location: [facility] OR;  Service: Thoracic;  Laterality: Right;    Family History   Problem Relation Age of Onset  . ALS Mother     Deceased 53  . Other Father     Deceased from [facility], hit by drunk driver.  Hx of prostate issues    Social History History  Substance Use Topics  . Smoking status: Former Smoker -- 2.00 packs/day for 36 years    Types: Cigarettes  . Smokeless tobacco: Never Used     Comment: no smoking since 12/24/12 states had smoked since age 32  . Alcohol Use: Yes     Comment: occasionally    Current Outpatient Prescriptions  Medication Sig Dispense Refill  . albuterol (PROVENTIL HFA;VENTOLIN HFA) 108 (90 BASE) MCG/ACT inhaler Inhale 2 puffs into the lungs every 4 (four) hours as needed for wheezing or shortness of breath.  1 Inhaler  1  . budesonide-formoterol (SYMBICORT) 160-4.5 MCG/ACT inhaler Inhale 2 puffs into the lungs 2 (two) times daily.  1 Inhaler  1  . cephALEXin (KEFLEX) 500 MG capsule Take 1 capsule (500 mg total) by mouth 3 (three) times daily.  21 capsule  0  . feeding supplement (ENSURE COMPLETE) LIQD Take 237 mLs by mouth 3 (three) times daily with meals.      Marland Kitchen HYDROcodone-acetaminophen (NORCO/VICODIN) 5-325 MG per tablet Take 1-2 tablets by mouth every 6 (six) hours as needed for pain.  50 tablet  0  . metoprolol tartrate (LOPRESSOR) 25 MG tablet Take 1 tablet (25 mg total) by  mouth 2 (two) times daily.  60 tablet  1  . ondansetron (ZOFRAN) 8 MG tablet Take 1 tablet (8 mg total) by mouth every 8 (eight) hours as needed for nausea.  20 tablet  2  . pantoprazole (PROTONIX) 40 MG tablet Take 1 tablet (40 mg total) by mouth at bedtime.  30 tablet  2  . prochlorperazine (COMPAZINE) 10 MG tablet Take 1 tablet (10 mg total) by mouth every 6 (six) hours as needed.  60 tablet  1   No current facility-administered medications for this visit.    No Known Allergies  Review of Systems denies fever  BP 123/82  Pulse 106  Resp 18  Ht 6' (1.829 m)  Wt 198 lb (89.812 kg)  BMI 26.85 kg/m2  SpO2 98% Physical Exam Tenderness around Pleurx  exit site with some drainage and erythema Diminished breath sounds right base, left lung clear  Diagnostic Tests: No recent chest x-ray-previous study shows atelectasis right lower lobe with loculated posterior effusion  Impression: Probable catheter site infection-will remove catheter  Plan: Schedule Pleurx catheter removal in short stay at hospital today to continue oral antibiotics in other week. May need catheter reinsertion after infection resolves

## 2013-04-22 NOTE — Procedures (Signed)
Brief procedure:   Right pleurx catheter removed d/t infection per Dr Donata Clay request Local : 10 cc 2% lidocaine Prep: betadine Catheter removed intact per routine technique Patient tolerated the procedure well

## 2013-04-22 NOTE — Progress Notes (Signed)
Patient given discharge instructions by Gershon Crane, PA

## 2013-04-25 NOTE — Procedures (Signed)
patient examined and medical record reviewed,agree with above note. VAN TRIGT III,Hailea Eaglin 04/25/2013   

## 2013-04-26 ENCOUNTER — Other Ambulatory Visit: Payer: Self-pay | Admitting: *Deleted

## 2013-04-26 ENCOUNTER — Encounter (HOSPITAL_COMMUNITY): Payer: Self-pay | Admitting: Cardiothoracic Surgery

## 2013-04-26 DIAGNOSIS — J90 Pleural effusion, not elsewhere classified: Secondary | ICD-10-CM

## 2013-04-27 ENCOUNTER — Ambulatory Visit (INDEPENDENT_AMBULATORY_CARE_PROVIDER_SITE_OTHER): Payer: Medicaid Other | Admitting: Cardiothoracic Surgery

## 2013-04-27 ENCOUNTER — Ambulatory Visit
Admission: RE | Admit: 2013-04-27 | Discharge: 2013-04-27 | Disposition: A | Discharge status: Home / Self Care | Payer: Medicaid Other | Source: Ambulatory Visit | Attending: Cardiothoracic Surgery | Admitting: Cardiothoracic Surgery

## 2013-04-27 ENCOUNTER — Encounter: Payer: Self-pay | Admitting: Cardiothoracic Surgery

## 2013-04-27 VITALS — BP 106/77 | HR 110 | Resp 20 | Ht 72.0 in | Wt 198.0 lb

## 2013-04-27 DIAGNOSIS — Z09 Encounter for follow-up examination after completed treatment for conditions other than malignant neoplasm: Secondary | ICD-10-CM

## 2013-04-27 DIAGNOSIS — J910 Malignant pleural effusion: Secondary | ICD-10-CM

## 2013-04-27 DIAGNOSIS — L0390 Cellulitis, unspecified: Secondary | ICD-10-CM

## 2013-04-27 DIAGNOSIS — L0291 Cutaneous abscess, unspecified: Secondary | ICD-10-CM

## 2013-04-27 DIAGNOSIS — J90 Pleural effusion, not elsewhere classified: Secondary | ICD-10-CM

## 2013-04-27 NOTE — Patient Instructions (Signed)
Finish 10 more days of antibiotic--Levaquin 500 mg once a day  Keep Pleurx catheter site clean and dry, report any redness or drainage  Proceed with Scheduled chemotherapy  The patient will be scheduled for an appointment with followup chest x-ray in 4 weeks

## 2013-04-27 NOTE — Progress Notes (Signed)
PCP is [name],FIRAS, MD Referring Provider is Parrett, Virgel Bouquet, NP  Chief Complaint  Patient presents with  . 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 Patient received one week of oral Keflex 500 4 times a day  Clinically patient is done well remaining baseline short of breath on home oxygen but no fever mild productive cough no pain over right chest wall no drainage from Pleurx catheter exit site   Past Medical History  Diagnosis Date  . Tobacco abuse     Began age 40  . Pleural effusion on right   . COPD (chronic obstructive pulmonary disease)   . Hypertension     pt states it fluctuates but not on any medications  . Emphysema   . Shortness of breath     lying/sitting/exertion  . Headache(784.0)     occasionally  . GERD (gastroesophageal reflux disease)     takes Protonix daily  . Nocturia   . Hypothyroidism     but not on medication at the present time  . Insomnia   . Lung cancer 02/04/2013    Past Surgical History  Procedure Laterality Date  . Appendectomy      as a child  . Video assisted thoracoscopy (vats) w/talc pleuadesis Right 01/14/2013    Procedure: VIDEO ASSISTED THORACOSCOPY (VATS) W/TALC PLEUADESIS;  Surgeon: Kerin Perna, MD;  Location: [facility] OR;  Service: Thoracic;  Laterality: Right;  . Chest tube insertion Right 01/14/2013    Procedure: INSERTION PLEURAL DRAINAGE CATHETER;  Surgeon: Kerin Perna, MD;  Location: [facility] OR;  Service: Thoracic;  Laterality: Right;  . Removal of pleural drainage catheter N/A 04/22/2013    Procedure: MINOR REMOVAL OF PLEURAL DRAINAGE CATHETER;  Surgeon: Kerin Perna, MD;   Location: [facility] OR;  Service: Thoracic;  Laterality: N/A;    Family History  Problem Relation Age of Onset  . ALS Mother     Deceased 38  . Other Father     Deceased from [facility], hit by drunk driver.  Hx of prostate issues    Social History History  Substance Use Topics  . Smoking status: Former Smoker -- 2.00 packs/day for 36 years    Types: Cigarettes  . Smokeless tobacco: Never Used     Comment: no smoking since 12/24/12 states had smoked since age 76  . Alcohol Use: Yes     Comment: occasionally    Current Outpatient Prescriptions  Medication Sig Dispense Refill  . albuterol (PROVENTIL HFA;VENTOLIN HFA) 108 (90 BASE) MCG/ACT inhaler Inhale 2 puffs into the lungs every 4 (four) hours as needed for wheezing or shortness of breath.  1 Inhaler  1  . budesonide-formoterol (SYMBICORT) 160-4.5 MCG/ACT inhaler Inhale 2 puffs into the lungs 2 (two) times daily.  1 Inhaler  1  . cephALEXin (KEFLEX) 500 MG capsule Take 1 capsule (500 mg total) by mouth 4 (four) times daily.  28 capsule  0  . feeding supplement (ENSURE COMPLETE) LIQD Take 237 mLs by mouth 3 (three) times daily with meals.      Marland Kitchen HYDROcodone-acetaminophen (NORCO/VICODIN) 5-325 MG per tablet Take 1-2 tablets by mouth every 6 (six) hours  as needed for pain.  50 tablet  0  . metoprolol tartrate (LOPRESSOR) 25 MG tablet Take 1 tablet (25 mg total) by mouth 2 (two) times daily.  60 tablet  1  . ondansetron (ZOFRAN) 8 MG tablet Take 1 tablet (8 mg total) by mouth every 8 (eight) hours as needed for nausea.  20 tablet  2  . pantoprazole (PROTONIX) 40 MG tablet Take 1 tablet (40 mg total) by mouth at bedtime.  30 tablet  2  . prochlorperazine (COMPAZINE) 10 MG tablet Take 1 tablet (10 mg total) by mouth every 6 (six) hours as needed.  60 tablet  1   No current facility-administered medications for this visit.    No Known Allergies  Review of Systems slowly but progressively getting weaker from advanced lung cancer  BP 106/77   Pulse 110  Resp 20  Ht 6' (1.829 m)  Wt 198 lb (89.812 kg)  BMI 26.85 kg/m2  SpO2 92% Physical Exam Alert and comfortable but on oxygen Breath sounds diminished right base Pleurx catheter site with small eschar, no erythema tenderness fluctuance or signs of cellulitis  Diagnostic Tests: Chest x-ray with accumulation of fluid in right pleural space from entrapped right lower lobe  Impression: Pleurx catheter superficial infection, fortunately without extension into the pleural space  Plan: Leave Pleurx catheter out.Re Placing in the catheter would not help reexpand the right lower lobe which is entrapped from cancer-visceral pleural metastases Another course of oral antibiotics-Levaquin 500x10 days Return with chest x-ray in 3-4 weeks Okay to continue scheduled chemotherapy sessions with no evidence of pleural space infection

## 2013-05-12 ENCOUNTER — Ambulatory Visit (HOSPITAL_BASED_OUTPATIENT_CLINIC_OR_DEPARTMENT_OTHER): Payer: Medicaid Other

## 2013-05-12 ENCOUNTER — Other Ambulatory Visit (HOSPITAL_BASED_OUTPATIENT_CLINIC_OR_DEPARTMENT_OTHER): Payer: Medicaid Other | Admitting: Lab

## 2013-05-12 ENCOUNTER — Ambulatory Visit (HOSPITAL_BASED_OUTPATIENT_CLINIC_OR_DEPARTMENT_OTHER): Payer: Medicaid Other | Admitting: Oncology

## 2013-05-12 DIAGNOSIS — J910 Malignant pleural effusion: Secondary | ICD-10-CM

## 2013-05-12 DIAGNOSIS — I10 Essential (primary) hypertension: Secondary | ICD-10-CM

## 2013-05-12 DIAGNOSIS — Z5111 Encounter for antineoplastic chemotherapy: Secondary | ICD-10-CM

## 2013-05-12 DIAGNOSIS — K229 Disease of esophagus, unspecified: Secondary | ICD-10-CM

## 2013-05-12 LAB — CBC WITH DIFFERENTIAL/PLATELET
Basophils Absolute: 0 10*3/uL (ref 0.0–0.1)
EOS%: 0.1 % (ref 0.0–7.0)
HCT: 32.6 % — ABNORMAL LOW (ref 38.4–49.9)
HGB: 9.6 g/dL — ABNORMAL LOW (ref 13.0–17.1)
MCH: 26.6 pg — ABNORMAL LOW (ref 27.2–33.4)
NEUT%: 71 % (ref 39.0–75.0)
Platelets: 148 10*3/uL (ref 140–400)
lymph#: 1.5 10*3/uL (ref 0.9–3.3)

## 2013-05-12 LAB — COMPREHENSIVE METABOLIC PANEL (CC13)
AST: 18 U/L (ref 5–34)
BUN: 13.3 mg/dL (ref 7.0–26.0)
CO2: 26 mEq/L (ref 22–29)
Calcium: 8.9 mg/dL (ref 8.4–10.4)
Chloride: 106 mEq/L (ref 98–109)
Creatinine: 0.6 mg/dL — ABNORMAL LOW (ref 0.7–1.3)

## 2013-05-12 MED ORDER — DOCETAXEL CHEMO INJECTION 160 MG/16ML
70.0000 mg/m2 | Freq: Once | INTRAVENOUS | Status: AC
  Administered 2013-05-12: 150 mg via INTRAVENOUS
  Filled 2013-05-12: qty 15

## 2013-05-12 MED ORDER — SODIUM CHLORIDE 0.9 % IV SOLN
Freq: Once | INTRAVENOUS | Status: AC
  Administered 2013-05-12: 12:00:00 via INTRAVENOUS

## 2013-05-12 MED ORDER — ONDANSETRON 16 MG/50ML IVPB (CHCC)
INTRAVENOUS | Status: AC
  Filled 2013-05-12: qty 16

## 2013-05-12 MED ORDER — DEXAMETHASONE SODIUM PHOSPHATE 20 MG/5ML IJ SOLN
INTRAMUSCULAR | Status: AC
  Filled 2013-05-12: qty 5

## 2013-05-12 MED ORDER — SODIUM CHLORIDE 0.9 % IV SOLN
750.0000 mg | Freq: Once | INTRAVENOUS | Status: AC
  Administered 2013-05-12: 750 mg via INTRAVENOUS
  Filled 2013-05-12: qty 75

## 2013-05-12 MED ORDER — ONDANSETRON 16 MG/50ML IVPB (CHCC)
16.0000 mg | Freq: Once | INTRAVENOUS | Status: AC
  Administered 2013-05-12: 16 mg via INTRAVENOUS

## 2013-05-12 MED ORDER — DEXAMETHASONE SODIUM PHOSPHATE 20 MG/5ML IJ SOLN
20.0000 mg | Freq: Once | INTRAMUSCULAR | Status: AC
  Administered 2013-05-12: 20 mg via INTRAVENOUS

## 2013-05-12 NOTE — Patient Instructions (Addendum)
[facility] Discharge Instructions for Patients Receiving Chemotherapy  Today you received the following chemotherapy agents:  Taxotere and Carboplatin  To help prevent nausea and vomiting after your treatment, we encourage you to take your nausea medication as ordered per MD.   If you develop nausea and vomiting that is not controlled by your nausea medication, call the clinic.   BELOW ARE SYMPTOMS THAT SHOULD BE REPORTED IMMEDIATELY:  *FEVER GREATER THAN 100.5 F  *CHILLS WITH OR WITHOUT FEVER  NAUSEA AND VOMITING THAT IS NOT CONTROLLED WITH YOUR NAUSEA MEDICATION  *UNUSUAL SHORTNESS OF BREATH  *UNUSUAL BRUISING OR BLEEDING  TENDERNESS IN MOUTH AND THROAT WITH OR WITHOUT PRESENCE OF ULCERS  *URINARY PROBLEMS  *BOWEL PROBLEMS  UNUSUAL RASH Items with * indicate a potential emergency and should be followed up as soon as possible.  Feel free to call the clinic you have any questions or concerns. The clinic phone number is [phone].    

## 2013-05-12 NOTE — Progress Notes (Signed)
Hematology and Oncology Follow Up Visit  Geoffrey Richardson [id number] 1961/05/05 52 y.o. 05/12/2013 11:57 AM [name],FIRAS, MDShadad, [name], MD   Principle Diagnosis: 52 year old gentleman with diagnosis of malignant pleural effusion noted in May of 2014. At that time he presented with weakness, right chest pain and found to have pleural effusion. The cytology is suggesting malignant effusion possibly non-small cell cancer.  Prior Therapy: Patient is status post pleural catheter insertion that was done on 01/15/2013.  Current therapy: Started systemic chemotherapy with Carboplatin and Taxotere on 02/17/13. He is here for cycle 5.   Interim History: Geoffrey Richardson is a pleasant 52 year old gentleman  presented with malignant pleural effusion status post thoracentesis with the cytology revealing non-small cell cancer histology. He received his last chemotherapy without complications. He still oxygen dependent (2-3L/min) and has improved mobility. He still has pain to his Pleurex catheter site and controlled now with pain medications. He is not reporting any fevers or chills. Has not reported any change in his respiratory status. He is eating better but weight have stable.  He is mobile at home but he fatigues pretty easy and does report some occasional dyspnea on exertion. He has not reported any new complications from chemotherapy. He denied peripheral neuropathy or infusion-related complications. He has not reported any nail changes.  Medications: I have reviewed the patient's current medications.  Current Outpatient Prescriptions  Medication Sig Dispense Refill  . albuterol (PROVENTIL HFA;VENTOLIN HFA) 108 (90 BASE) MCG/ACT inhaler Inhale 2 puffs into the lungs every 4 (four) hours as needed for wheezing or shortness of breath.  1 Inhaler  1  . budesonide-formoterol (SYMBICORT) 160-4.5 MCG/ACT inhaler Inhale 2 puffs into the lungs 2 (two) times daily.  1 Inhaler  1  . feeding supplement (ENSURE COMPLETE)  LIQD Take 237 mLs by mouth 3 (three) times daily with meals.      Marland Kitchen HYDROcodone-acetaminophen (NORCO/VICODIN) 5-325 MG per tablet Take 1-2 tablets by mouth every 6 (six) hours as needed for pain.  50 tablet  0  . metoprolol tartrate (LOPRESSOR) 25 MG tablet Take 1 tablet (25 mg total) by mouth 2 (two) times daily.  60 tablet  1  . ondansetron (ZOFRAN) 8 MG tablet Take 1 tablet (8 mg total) by mouth every 8 (eight) hours as needed for nausea.  20 tablet  2  . pantoprazole (PROTONIX) 40 MG tablet Take 1 tablet (40 mg total) by mouth at bedtime.  30 tablet  2  . prochlorperazine (COMPAZINE) 10 MG tablet Take 1 tablet (10 mg total) by mouth every 6 (six) hours as needed.  60 tablet  1   No current facility-administered medications for this visit.     Allergies: No Known Allergies  Past Medical History, Surgical history, Social history, and Family History were reviewed and updated.  Review of Systems:  The remaining ROS negative.  Physical Exam: Blood pressure 109/71, pulse 106, temperature 98.4 F (36.9 C), temperature source Oral, resp. rate 18, height 6' (1.829 m), weight 198 lb 4.8 oz (89.948 kg). ECOG: 1 General appearance: alert Head: Normocephalic, without obvious abnormality, atraumatic Neck: no adenopathy, no carotid bruit, no JVD, supple, symmetrical, trachea midline and thyroid not enlarged, symmetric, no tenderness/mass/nodules Lymph nodes: Cervical, supraclavicular, and axillary nodes normal. Heart:regular rate and rhythm, S1, S2 normal, no murmur, click, rub or gallop Lung:chest clear, no wheezing, rales, normal symmetric air entry. He has decreased breath sounds on the right. Abdomen: soft, non-tender, without masses or organomegaly EXT:no erythema, induration, or nodules Neurological: No  deficits motor sensory or proprioception. No change in his gait or mentation.  Lab Results: Lab Results  Component Value Date   WBC 7.9 05/12/2013   HGB 9.6* 05/12/2013   HCT 32.6*  05/12/2013   MCV 90.3 05/12/2013   PLT 148 05/12/2013     Chemistry      Component Value Date/Time   NA 140 04/20/2013 1415   NA 137 01/17/2013 0926   K 4.2 04/20/2013 1415   K 3.8 01/17/2013 0926   CL 103 01/19/2013 1008   CL 97 01/17/2013 0926   CO2 28 04/20/2013 1415   CO2 29 01/17/2013 0926   BUN 12.3 04/20/2013 1415   BUN 17 01/17/2013 0926   CREATININE 0.7 04/20/2013 1415   CREATININE 0.70 01/17/2013 0926      Component Value Date/Time   CALCIUM 8.9 04/20/2013 1415   CALCIUM 8.7 01/17/2013 0926   ALKPHOS 117 04/20/2013 1415   ALKPHOS 93 01/16/2013 0600   AST 36* 04/20/2013 1415   AST 13 01/16/2013 0600   ALT 82* 04/20/2013 1415   ALT 18 01/16/2013 0600   BILITOT 0.27 04/20/2013 1415   BILITOT 0.3 01/16/2013 0600      Impression and Plan: 52 year old with the following issues:   1. 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 He is status post pleural catheter inserted on 01/15/2013. He is currently receiving systemic chemotherapy with Carboplatin and Taxotere. He is tolerating it without any major toxicities. CT scan from 04/20/2013 showed a predominantly stable disease with pleural effusion but no clear-cut progression of his disease. The plan at at this point is to  proceed with cycle 4 of chemotherapy today without dose modification. I will repeat CT scan after cycle 6.  2. Esophageal focal narrowing . I doubt this is malignancy. He is asymptomatic at this point. A PET/CT scan failed to show anything to suggest esophageal malignancy.  3. Nausea/vomiting prophylaxis. I have refilled his Compazine and have given him Zofran as well.   4. Pleural effusion with a Pleurx catheter: He is continued to have continuous drainage indicating still active disease.   5. COPD: He is on Albuterol PRN. I have refilled his Symbicort as he is completely out of this medication. On O2 2-3 L.  6. HTN. BP better today.  7. Pain control. He has pain due to the Pleurex catheter. On  Vicodin now.   8. Neutropenia prophylaxis. He receives Neulasta with each chemo.   9. Follow-up. In 3 weeks.   [name],FIRAS 10/2/201411:57 AM

## 2013-05-13 ENCOUNTER — Ambulatory Visit (HOSPITAL_BASED_OUTPATIENT_CLINIC_OR_DEPARTMENT_OTHER): Payer: Medicaid Other

## 2013-05-13 VITALS — BP 128/86 | HR 107 | Temp 97.6°F

## 2013-05-13 DIAGNOSIS — J910 Malignant pleural effusion: Secondary | ICD-10-CM

## 2013-05-13 DIAGNOSIS — C3490 Malignant neoplasm of unspecified part of unspecified bronchus or lung: Secondary | ICD-10-CM

## 2013-05-13 DIAGNOSIS — Z5189 Encounter for other specified aftercare: Secondary | ICD-10-CM

## 2013-05-13 MED ORDER — PEGFILGRASTIM INJECTION 6 MG/0.6ML
6.0000 mg | Freq: Once | SUBCUTANEOUS | Status: AC
  Administered 2013-05-13: 6 mg via SUBCUTANEOUS
  Filled 2013-05-13: qty 0.6

## 2013-05-23 ENCOUNTER — Other Ambulatory Visit: Payer: Self-pay | Admitting: *Deleted

## 2013-05-23 DIAGNOSIS — J90 Pleural effusion, not elsewhere classified: Secondary | ICD-10-CM

## 2013-05-25 ENCOUNTER — Ambulatory Visit: Payer: Medicaid Other | Admitting: Cardiothoracic Surgery

## 2013-06-02 ENCOUNTER — Telehealth: Payer: Self-pay | Admitting: Oncology

## 2013-06-02 ENCOUNTER — Ambulatory Visit (HOSPITAL_BASED_OUTPATIENT_CLINIC_OR_DEPARTMENT_OTHER): Payer: Medicaid Other | Admitting: Oncology

## 2013-06-02 ENCOUNTER — Other Ambulatory Visit (HOSPITAL_BASED_OUTPATIENT_CLINIC_OR_DEPARTMENT_OTHER): Payer: Medicaid Other | Admitting: Lab

## 2013-06-02 ENCOUNTER — Ambulatory Visit (HOSPITAL_BASED_OUTPATIENT_CLINIC_OR_DEPARTMENT_OTHER): Payer: Medicaid Other

## 2013-06-02 ENCOUNTER — Other Ambulatory Visit: Payer: Self-pay | Admitting: *Deleted

## 2013-06-02 DIAGNOSIS — J910 Malignant pleural effusion: Secondary | ICD-10-CM

## 2013-06-02 DIAGNOSIS — Z5111 Encounter for antineoplastic chemotherapy: Secondary | ICD-10-CM

## 2013-06-02 DIAGNOSIS — J4489 Other specified chronic obstructive pulmonary disease: Secondary | ICD-10-CM

## 2013-06-02 DIAGNOSIS — I10 Essential (primary) hypertension: Secondary | ICD-10-CM

## 2013-06-02 DIAGNOSIS — J449 Chronic obstructive pulmonary disease, unspecified: Secondary | ICD-10-CM

## 2013-06-02 LAB — CBC WITH DIFFERENTIAL/PLATELET
Basophils Absolute: 0.1 10*3/uL (ref 0.0–0.1)
HCT: 33.7 % — ABNORMAL LOW (ref 38.4–49.9)
HGB: 10.4 g/dL — ABNORMAL LOW (ref 13.0–17.1)
MONO#: 0.7 10*3/uL (ref 0.1–0.9)
NEUT%: 67 % (ref 39.0–75.0)
WBC: 7.6 10*3/uL (ref 4.0–10.3)
lymph#: 1.8 10*3/uL (ref 0.9–3.3)

## 2013-06-02 LAB — COMPREHENSIVE METABOLIC PANEL (CC13)
ALT: 9 U/L (ref 0–55)
Anion Gap: 8 mEq/L (ref 3–11)
BUN: 14.6 mg/dL (ref 7.0–26.0)
CO2: 24 mEq/L (ref 22–29)
Calcium: 9 mg/dL (ref 8.4–10.4)
Chloride: 109 mEq/L (ref 98–109)
Creatinine: 0.7 mg/dL (ref 0.7–1.3)

## 2013-06-02 MED ORDER — DEXAMETHASONE SODIUM PHOSPHATE 20 MG/5ML IJ SOLN
INTRAMUSCULAR | Status: AC
  Filled 2013-06-02: qty 5

## 2013-06-02 MED ORDER — SODIUM CHLORIDE 0.9 % IV SOLN
Freq: Once | INTRAVENOUS | Status: AC
  Administered 2013-06-02: 10:00:00 via INTRAVENOUS

## 2013-06-02 MED ORDER — ONDANSETRON 16 MG/50ML IVPB (CHCC)
INTRAVENOUS | Status: AC
  Filled 2013-06-02: qty 16

## 2013-06-02 MED ORDER — ONDANSETRON 16 MG/50ML IVPB (CHCC)
16.0000 mg | Freq: Once | INTRAVENOUS | Status: AC
  Administered 2013-06-02: 16 mg via INTRAVENOUS

## 2013-06-02 MED ORDER — HYDROCODONE-ACETAMINOPHEN 5-325 MG PO TABS: 1.0000 | ORAL_TABLET | Freq: Four times a day (QID) | ORAL | Status: DC | PRN

## 2013-06-02 MED ORDER — DOCETAXEL CHEMO INJECTION 160 MG/16ML
70.0000 mg/m2 | Freq: Once | INTRAVENOUS | Status: AC
  Administered 2013-06-02: 150 mg via INTRAVENOUS
  Filled 2013-06-02: qty 15

## 2013-06-02 MED ORDER — SODIUM CHLORIDE 0.9 % IV SOLN
750.0000 mg | Freq: Once | INTRAVENOUS | Status: AC
  Administered 2013-06-02: 750 mg via INTRAVENOUS
  Filled 2013-06-02: qty 75

## 2013-06-02 MED ORDER — DEXAMETHASONE SODIUM PHOSPHATE 20 MG/5ML IJ SOLN
20.0000 mg | Freq: Once | INTRAMUSCULAR | Status: AC
  Administered 2013-06-02: 20 mg via INTRAVENOUS

## 2013-06-02 NOTE — Patient Instructions (Signed)
[facility] Discharge Instructions for Patients Receiving Chemotherapy  Today you received the following chemotherapy agents: Taxotere and Carboplatin  To help prevent nausea and vomiting after your treatment, we encourage you to take your nausea medication as prescribed.   If you develop nausea and vomiting that is not controlled by your nausea medication, call the clinic.   BELOW ARE SYMPTOMS THAT SHOULD BE REPORTED IMMEDIATELY:  *FEVER GREATER THAN 100.5 F  *CHILLS WITH OR WITHOUT FEVER  NAUSEA AND VOMITING THAT IS NOT CONTROLLED WITH YOUR NAUSEA MEDICATION  *UNUSUAL SHORTNESS OF BREATH  *UNUSUAL BRUISING OR BLEEDING  TENDERNESS IN MOUTH AND THROAT WITH OR WITHOUT PRESENCE OF ULCERS  *URINARY PROBLEMS  *BOWEL PROBLEMS  UNUSUAL RASH Items with * indicate a potential emergency and should be followed up as soon as possible.  Feel free to call the clinic you have any questions or concerns. The clinic phone number is [phone].    

## 2013-06-02 NOTE — Progress Notes (Signed)
Hematology and Oncology Follow Up Visit  Geoffrey Richardson [id number] 09-06-1960 52 y.o. 06/02/2013 8:45 AM Geoffrey Richardson, MDShadad, Geoffrey Nicely, MD   Principle Diagnosis: 52 year old gentleman with diagnosis of malignant pleural effusion noted in May of 2014. At that time he presented with weakness, right chest pain and found to have pleural effusion. The cytology is suggesting malignant effusion possibly non-small cell cancer.  Prior Therapy: Patient is status post pleural catheter insertion that was done on 01/15/2013.  Current therapy: Started systemic chemotherapy with Carboplatin and Taxotere on 02/17/13. He is here for cycle 6.   Interim History: Geoffrey Richardson is a pleasant 52 year old gentleman  presented with malignant pleural effusion status post thoracentesis with the cytology revealing non-small cell cancer histology. He received his last chemotherapy without complications. He still oxygen dependent (2-3L/min) and has improved mobility. He still has pain around the Pleurex catheter site and controlled now with pain medications. He is not reporting any fevers or chills. Has not reported any change in his respiratory status. He is eating better but weight have stable.  He is mobile at home but he fatigues pretty easy and does report some occasional dyspnea on exertion. He has not reported any new problems since the last visit. He reports less shortness of breath with rest and does not require oxygen but his exercise tolerance is very limited and does require oxygen when he is ambulatory.   Medications: I have reviewed the patient's current medications.  Current Outpatient Prescriptions  Medication Sig Dispense Refill  . albuterol (PROVENTIL HFA;VENTOLIN HFA) 108 (90 BASE) MCG/ACT inhaler Inhale 2 puffs into the lungs every 4 (four) hours as needed for wheezing or shortness of breath.  1 Inhaler  1  . budesonide-formoterol (SYMBICORT) 160-4.5 MCG/ACT inhaler Inhale 2 puffs into the lungs 2 (two) times  daily.  1 Inhaler  1  . feeding supplement (ENSURE COMPLETE) LIQD Take 237 mLs by mouth 3 (three) times daily with meals.      . metoprolol tartrate (LOPRESSOR) 25 MG tablet Take 1 tablet (25 mg total) by mouth 2 (two) times daily.  60 tablet  1  . ondansetron (ZOFRAN) 8 MG tablet Take 1 tablet (8 mg total) by mouth every 8 (eight) hours as needed for nausea.  20 tablet  2  . pantoprazole (PROTONIX) 40 MG tablet Take 1 tablet (40 mg total) by mouth at bedtime.  30 tablet  2  . prochlorperazine (COMPAZINE) 10 MG tablet Take 1 tablet (10 mg total) by mouth every 6 (six) hours as needed.  60 tablet  1  . HYDROcodone-acetaminophen (NORCO/VICODIN) 5-325 MG per tablet Take 1-2 tablets by mouth every 6 (six) hours as needed for pain.  60 tablet  0   No current facility-administered medications for this visit.     Allergies: No Known Allergies  Past Medical History, Surgical history, Social history, and Family History were reviewed and updated.  Review of Systems:  The remaining ROS negative.  Physical Exam: Blood pressure 121/78, pulse 108, temperature 97 F (36.1 C), temperature source Oral, resp. rate 20, height 6' (1.829 m), weight 194 lb 6.4 oz (88.179 kg). ECOG: 1 General appearance: alert Head: Normocephalic, without obvious abnormality, atraumatic Neck: no adenopathy, no carotid bruit, no JVD, supple, symmetrical, trachea midline and thyroid not enlarged, symmetric, no tenderness/mass/nodules Lymph nodes: Cervical, supraclavicular, and axillary nodes normal. Heart:regular rate and rhythm, S1, S2 normal, no murmur, click, rub or gallop Lung:chest clear, no wheezing, rales, normal symmetric air entry. He has decreased breath sounds  on the right. Abdomen: soft, non-tender, without masses or organomegaly EXT:no erythema, induration, or nodules Neurological: No deficits motor sensory or proprioception. No change in his gait or mentation.  Lab Results: Lab Results  Component Value Date    WBC 7.6 06/02/2013   HGB 10.4* 06/02/2013   HCT 33.7* 06/02/2013   MCV 87.8 06/02/2013   PLT 229 06/02/2013     Chemistry      Component Value Date/Time   NA 140 05/12/2013 1135   NA 137 01/17/2013 0926   K 4.0 05/12/2013 1135   K 3.8 01/17/2013 0926   CL 103 01/19/2013 1008   CL 97 01/17/2013 0926   CO2 26 05/12/2013 1135   CO2 29 01/17/2013 0926   BUN 13.3 05/12/2013 1135   BUN 17 01/17/2013 0926   CREATININE 0.6* 05/12/2013 1135   CREATININE 0.70 01/17/2013 0926      Component Value Date/Time   CALCIUM 8.9 05/12/2013 1135   CALCIUM 8.7 01/17/2013 0926   ALKPHOS 85 05/12/2013 1135   ALKPHOS 93 01/16/2013 0600   AST 18 05/12/2013 1135   AST 13 01/16/2013 0600   ALT 20 05/12/2013 1135   ALT 18 01/16/2013 0600   BILITOT 0.29 05/12/2013 1135   BILITOT 0.3 01/16/2013 0600      Impression and Plan: 52 year old with the following issues:   1. 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 He is status post pleural catheter inserted on 01/15/2013. He is currently receiving systemic chemotherapy with Carboplatin and Taxotere. He is tolerating it without any major toxicities. CT scan from 04/20/2013 showed a predominantly stable disease with pleural effusion but no clear-cut progression of his disease. The plan at at this point is to  proceed with cycle 6 of chemotherapy today without dose modification. I will repeat CT scan in 3 weeks.   2. Esophageal focal narrowing . I doubt this is malignancy. He is asymptomatic at this point. A PET/CT scan failed to show anything to suggest esophageal malignancy.  3. Nausea/vomiting prophylaxis. I have refilled his Compazine and have given him Zofran as well.   4. Pleural effusion with a Pleurx catheter: His catheter have been removed that he has slight decreased breath sounds on the right and that we will evaluate that with a CT scan as a part of the staging process.  5. COPD: He is on Albuterol PRN. I have refilled his Symbicort as he is  completely out of this medication. On O2 2-3 L.  6. HTN. BP better today.  7. Pain control. He has pain due to the Pleurex catheter. On Vicodin now which I have refilled today.  8. Neutropenia prophylaxis. He receives Neulasta with each chemo.   9. Follow-up. In 3 weeks.   Nakayla Rorabaugh 10/23/20148:45 AM

## 2013-06-02 NOTE — Telephone Encounter (Signed)
Gave pt appt for lab and MD for Novembe, gave pt oral contrast for CT

## 2013-06-03 ENCOUNTER — Ambulatory Visit (HOSPITAL_BASED_OUTPATIENT_CLINIC_OR_DEPARTMENT_OTHER): Payer: Medicaid Other

## 2013-06-03 VITALS — BP 129/77 | HR 115 | Temp 98.1°F

## 2013-06-03 DIAGNOSIS — Z5189 Encounter for other specified aftercare: Secondary | ICD-10-CM

## 2013-06-03 DIAGNOSIS — J910 Malignant pleural effusion: Secondary | ICD-10-CM

## 2013-06-03 MED ORDER — PEGFILGRASTIM INJECTION 6 MG/0.6ML
6.0000 mg | Freq: Once | SUBCUTANEOUS | Status: AC
  Administered 2013-06-03: 6 mg via SUBCUTANEOUS
  Filled 2013-06-03: qty 0.6

## 2013-06-08 ENCOUNTER — Other Ambulatory Visit: Payer: Self-pay | Admitting: *Deleted

## 2013-06-08 ENCOUNTER — Encounter: Payer: Self-pay | Admitting: Cardiothoracic Surgery

## 2013-06-08 ENCOUNTER — Ambulatory Visit (INDEPENDENT_AMBULATORY_CARE_PROVIDER_SITE_OTHER): Payer: Medicaid Other | Admitting: Cardiothoracic Surgery

## 2013-06-08 ENCOUNTER — Ambulatory Visit
Admission: RE | Admit: 2013-06-08 | Discharge: 2013-06-08 | Disposition: A | Discharge status: Home / Self Care | Payer: Medicaid Other | Source: Ambulatory Visit | Attending: Cardiothoracic Surgery | Admitting: Cardiothoracic Surgery

## 2013-06-08 VITALS — BP 109/76 | HR 104 | Resp 16 | Ht 72.0 in | Wt 193.0 lb

## 2013-06-08 DIAGNOSIS — J90 Pleural effusion, not elsewhere classified: Secondary | ICD-10-CM

## 2013-06-08 DIAGNOSIS — J910 Malignant pleural effusion: Secondary | ICD-10-CM

## 2013-06-08 NOTE — Progress Notes (Signed)
PCP is [name],FIRAS, MD Referring Provider is Parrett, Virgel Bouquet, NP  Chief Complaint  Patient presents with  . Malignant Pleural Effusion    right....i month f/u with cxr    HPI: Patient returns for followup of right malignant effusion. 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 Pleurx catheter placed earlier this year centrally located within the oral collection had minimal drainage and the catheter was removed. Pleural collection has an enhanced rind and probable is very thick and viscous and not drained by Pleurx. Patient remains minimally symptomatic and chest x-ray is unchanged following removal Pleurx catheter.  If the patient becomes  symptomatic  then ultrasound directed thoracentesis should be attempted first-- if that is not effective then only a repeat VATS-decortication would be effective in that would probably be too much surgery for this patient.   Past Medical History  Diagnosis Date  . Tobacco abuse     Began age 52  . Pleural effusion on right   . COPD (chronic obstructive pulmonary disease)   . Hypertension     pt states it fluctuates but not on any medications  . Emphysema   . Shortness of breath     lying/sitting/exertion  . Headache(784.0)     occasionally  . GERD (gastroesophageal reflux disease)     takes Protonix daily  . Nocturia   . Hypothyroidism     but not on medication at the present time  . Insomnia   . Lung cancer 02/04/2013    Past Surgical History  Procedure Laterality Date  . Appendectomy      as a child  . Video assisted thoracoscopy (vats) w/talc pleuadesis Right 01/14/2013    Procedure: VIDEO ASSISTED THORACOSCOPY (VATS) W/TALC PLEUADESIS;  Surgeon: Kerin Perna, MD;  Location: [facility] OR;  Service: Thoracic;  Laterality: Right;  . Chest tube insertion Right 01/14/2013    Procedure: INSERTION PLEURAL DRAINAGE CATHETER;  Surgeon: Kerin Perna, MD;  Location: [facility] OR;  Service: Thoracic;  Laterality: Right;  . Removal of  pleural drainage catheter N/A 04/22/2013    Procedure: MINOR REMOVAL OF PLEURAL DRAINAGE CATHETER;  Surgeon: Kerin Perna, MD;  Location: [facility] OR;  Service: Thoracic;  Laterality: N/A;    Family History  Problem Relation Age of Onset  . ALS Mother     Deceased 51  . Other Father     Deceased from [facility], hit by drunk driver.  Hx of prostate issues    Social History History  Substance Use Topics  . Smoking status: Former Smoker -- 2.00 packs/day for 36 years    Types: Cigarettes  . Smokeless tobacco: Never Used     Comment: no smoking since 12/24/12 states had smoked since age 52  . Alcohol Use: Yes     Comment: occasionally    Current Outpatient Prescriptions  Medication Sig Dispense Refill  . albuterol (PROVENTIL HFA;VENTOLIN HFA) 108 (90 BASE) MCG/ACT inhaler Inhale 2 puffs into the lungs every 4 (four) hours as needed for wheezing or shortness of breath.  1 Inhaler  1  . budesonide-formoterol (SYMBICORT) 160-4.5 MCG/ACT inhaler Inhale 2 puffs into the lungs 2 (two) times daily.  1 Inhaler  1  . feeding supplement (ENSURE COMPLETE) LIQD Take 237 mLs by mouth 3 (three) times daily with meals.      Marland Kitchen HYDROcodone-acetaminophen (NORCO/VICODIN) 5-325 MG per tablet Take 1-2 tablets by mouth every 6 (six) hours as needed for pain.  60 tablet  0  . metoprolol tartrate (LOPRESSOR)  25 MG tablet Take 1 tablet (25 mg total) by mouth 2 (two) times daily.  60 tablet  1  . ondansetron (ZOFRAN) 8 MG tablet Take 1 tablet (8 mg total) by mouth every 8 (eight) hours as needed for nausea.  20 tablet  2  . pantoprazole (PROTONIX) 40 MG tablet Take 1 tablet (40 mg total) by mouth at bedtime.  30 tablet  2  . prochlorperazine (COMPAZINE) 10 MG tablet Take 1 tablet (10 mg total) by mouth every 6 (six) hours as needed.  60 tablet  1   No current facility-administered medications for this visit.    No Known Allergies  Review of Systems productive cough of material-patient will start taking Mucinex twice  a day  BP 109/76  Pulse 104  Resp 16  Ht 6' (1.829 m)  Wt 193 lb (87.544 kg)  BMI 26.17 kg/m2  SpO2 98% Physical Exam Alert and comfortable Breath sounds diminished on the right Heart rate mildly elevated  Diagnostic Tests: Chest x-ray reviewed Impression: Persistent right pleural collection-to thick to be removed by Pleurx.  Plan: Patient will get followup CT scan of chest in 3 weeks for restaging after chemotherapy Patient return for followup in 4 weeks.

## 2013-06-16 ENCOUNTER — Other Ambulatory Visit: Payer: Self-pay

## 2013-06-27 ENCOUNTER — Telehealth: Payer: Self-pay | Admitting: Medical Oncology

## 2013-06-27 NOTE — Telephone Encounter (Signed)
Attempted to call patient at both numbers listed in Med Rec, to inform him of r/s of CT scan to 11/21 @ 0900, pending approval with insurance. Both numbers listed with message stating number inactive or no longer in service. Emergency contact number listed same as patient's cell phone number. Unable to inform patient of appt change at this time.

## 2013-06-28 ENCOUNTER — Ambulatory Visit (HOSPITAL_COMMUNITY): Payer: Medicaid Other

## 2013-06-28 ENCOUNTER — Other Ambulatory Visit (HOSPITAL_BASED_OUTPATIENT_CLINIC_OR_DEPARTMENT_OTHER): Payer: Medicaid Other | Admitting: Lab

## 2013-06-28 DIAGNOSIS — J90 Pleural effusion, not elsewhere classified: Secondary | ICD-10-CM

## 2013-06-28 LAB — COMPREHENSIVE METABOLIC PANEL (CC13)
ALT: 10 U/L (ref 0–55)
AST: 12 U/L (ref 5–34)
Albumin: 3 g/dL — ABNORMAL LOW (ref 3.5–5.0)
BUN: 8.3 mg/dL (ref 7.0–26.0)
Calcium: 9.4 mg/dL (ref 8.4–10.4)
Chloride: 105 mEq/L (ref 98–109)
Potassium: 4.1 mEq/L (ref 3.5–5.1)
Sodium: 142 mEq/L (ref 136–145)
Total Protein: 7.9 g/dL (ref 6.4–8.3)

## 2013-06-28 LAB — CBC WITH DIFFERENTIAL/PLATELET
BASO%: 0.2 % (ref 0.0–2.0)
Basophils Absolute: 0 10e3/uL (ref 0.0–0.1)
EOS%: 0.3 % (ref 0.0–7.0)
Eosinophils Absolute: 0 10e3/uL (ref 0.0–0.5)
HCT: 33.9 % — ABNORMAL LOW (ref 38.4–49.9)
HGB: 10.3 g/dL — ABNORMAL LOW (ref 13.0–17.1)
LYMPH%: 27.1 % (ref 14.0–49.0)
MCH: 27.1 pg — ABNORMAL LOW (ref 27.2–33.4)
MCHC: 30.3 g/dL — ABNORMAL LOW (ref 32.0–36.0)
MCV: 89.4 fL (ref 79.3–98.0)
MONO#: 0.6 10e3/uL (ref 0.1–0.9)
MONO%: 9.2 % (ref 0.0–14.0)
NEUT#: 4.1 10e3/uL (ref 1.5–6.5)
NEUT%: 63.2 % (ref 39.0–75.0)
Platelets: 206 10e3/uL (ref 140–400)
RBC: 3.79 10e6/uL — ABNORMAL LOW (ref 4.20–5.82)
RDW: 19.6 % — ABNORMAL HIGH (ref 11.0–14.6)
WBC: 6.5 10e3/uL (ref 4.0–10.3)
lymph#: 1.8 10e3/uL (ref 0.9–3.3)

## 2013-06-30 ENCOUNTER — Ambulatory Visit: Payer: Medicaid Other | Admitting: Oncology

## 2013-06-30 ENCOUNTER — Telehealth: Payer: Self-pay | Admitting: Medical Oncology

## 2013-06-30 NOTE — Telephone Encounter (Signed)
Informed patient will r/s appt with Dr Clelia Croft d/t r/s of CT scan for Friday 07/01/13. Patient gave verbal understanding and requesting to r/s week of December 1st since he will be out of town next week. MD informed, POF sent to sched to r/s appt and inform patient.

## 2013-07-01 ENCOUNTER — Telehealth: Payer: Self-pay | Admitting: Oncology

## 2013-07-01 ENCOUNTER — Ambulatory Visit (HOSPITAL_COMMUNITY)
Admission: RE | Admit: 2013-07-01 | Discharge: 2013-07-01 | Disposition: A | Discharge status: Home / Self Care | Payer: Medicaid Other | Source: Ambulatory Visit | Attending: Oncology | Admitting: Oncology

## 2013-07-01 DIAGNOSIS — J438 Other emphysema: Secondary | ICD-10-CM | POA: Insufficient documentation

## 2013-07-01 DIAGNOSIS — C3490 Malignant neoplasm of unspecified part of unspecified bronchus or lung: Secondary | ICD-10-CM | POA: Insufficient documentation

## 2013-07-01 DIAGNOSIS — R599 Enlarged lymph nodes, unspecified: Secondary | ICD-10-CM | POA: Insufficient documentation

## 2013-07-01 DIAGNOSIS — N281 Cyst of kidney, acquired: Secondary | ICD-10-CM | POA: Insufficient documentation

## 2013-07-01 DIAGNOSIS — J90 Pleural effusion, not elsewhere classified: Secondary | ICD-10-CM | POA: Insufficient documentation

## 2013-07-01 DIAGNOSIS — N200 Calculus of kidney: Secondary | ICD-10-CM | POA: Insufficient documentation

## 2013-07-01 DIAGNOSIS — K4090 Unilateral inguinal hernia, without obstruction or gangrene, not specified as recurrent: Secondary | ICD-10-CM | POA: Insufficient documentation

## 2013-07-01 MED ORDER — IOHEXOL 300 MG/ML  SOLN
100.0000 mL | Freq: Once | INTRAMUSCULAR | Status: AC | PRN
  Administered 2013-07-01: 100 mL via INTRAVENOUS

## 2013-07-01 NOTE — Telephone Encounter (Signed)
s/w pt re appt for 1/22 °

## 2013-07-11 ENCOUNTER — Ambulatory Visit
Admission: RE | Admit: 2013-07-11 | Discharge: 2013-07-11 | Disposition: A | Discharge status: Home / Self Care | Payer: Medicaid Other | Source: Ambulatory Visit | Attending: Cardiothoracic Surgery | Admitting: Cardiothoracic Surgery

## 2013-07-11 ENCOUNTER — Ambulatory Visit (INDEPENDENT_AMBULATORY_CARE_PROVIDER_SITE_OTHER): Payer: Medicaid Other | Admitting: Surgical

## 2013-07-11 ENCOUNTER — Other Ambulatory Visit: Payer: Self-pay | Admitting: *Deleted

## 2013-07-11 VITALS — BP 135/84 | HR 115 | Resp 20 | Ht 72.0 in | Wt 199.0 lb

## 2013-07-11 DIAGNOSIS — J90 Pleural effusion, not elsewhere classified: Secondary | ICD-10-CM

## 2013-07-11 DIAGNOSIS — J910 Malignant pleural effusion: Secondary | ICD-10-CM

## 2013-07-11 NOTE — Patient Instructions (Signed)
Followup with Dr. Donata Clay as well as oncology and primary care as they request.

## 2013-07-11 NOTE — Progress Notes (Signed)
[address]       [place] [address]             [phone]                  [facility] Medical Record #[id number] Date of Birth: [birth year]  [name], MD [facility], MD  Chief Complaint:   PostOp Follow Up Visit   History of Present Illness:    Patient is seen in office followup for his malignant right-sided pleural effusion. He had a repeat CT scan of the chest with contrast on November 21 and a chest x-ray on today's date. There does appear to be some increased right-sided pleural effusion with increase mass effect and minimal mediastinal deviation to the left. The underlying right lung base collapse/consolidative changes may be slightly increased. Findings do favor atelectasis. Overall he is findings are mostly similar in appearance to previous studies. The patient does report a cough which is sometimes productive. He is currently on home oxygen at 2 L and reasonably comfortable in regards to shortness of breath.          History  Smoking status  . Former Smoker -- 2.00 packs/day for 36 years  . Types: Cigarettes  Smokeless tobacco  . Never Used    Comment: no smoking since 12/24/12 states had smoked since age 13       No Known Allergies  Current Outpatient Prescriptions  Medication Sig Dispense Refill  . albuterol (PROVENTIL HFA;VENTOLIN HFA) 108 (90 BASE) MCG/ACT inhaler Inhale 2 puffs into the lungs every 4 (four) hours as needed for wheezing or shortness of breath.  1 Inhaler  1  . budesonide-formoterol (SYMBICORT) 160-4.5 MCG/ACT inhaler Inhale 2 puffs into the lungs 2 (two) times daily.  1 Inhaler  1  . feeding supplement (ENSURE COMPLETE) LIQD Take 237 mLs by mouth 3 (three) times daily with meals.      Marland Kitchen guaiFENesin (MUCINEX) 600 MG 12 hr tablet Take by mouth 2 (two) times daily.      Marland Kitchen HYDROcodone-acetaminophen (NORCO/VICODIN) 5-325 MG per tablet Take 1-2 tablets by mouth every 6 (six) hours as needed for pain.   60 tablet  0  . metoprolol tartrate (LOPRESSOR) 25 MG tablet Take 1 tablet (25 mg total) by mouth 2 (two) times daily.  60 tablet  1  . ondansetron (ZOFRAN) 8 MG tablet Take 1 tablet (8 mg total) by mouth every 8 (eight) hours as needed for nausea.  20 tablet  2  . pantoprazole (PROTONIX) 40 MG tablet Take 1 tablet (40 mg total) by mouth at bedtime.  30 tablet  2  . prochlorperazine (COMPAZINE) 10 MG tablet Take 1 tablet (10 mg total) by mouth every 6 (six) hours as needed.  60 tablet  1   No current facility-administered medications for this visit.       Physical Exam: BP 135/84  Pulse 115  Resp 20  Ht 6' (1.829 m)  Wt 199 lb (90.266 kg)  BMI 26.98 kg/m2  SpO2 97%  General appearance: alert, cooperative and no distress Heart: regular rate and rhythm Lungs: Diminished right lung fields. Wounds:  Diagnostic Studies & Laboratory data:         Recent Radiology Findings: Dg Chest 2 View  07/11/2013   CLINICAL DATA:  Pleural effusion.  Lung cancer  EXAM: CHEST  2 VIEW  COMPARISON:  06/08/2013  FINDINGS: Moderate to large loculated right lower lobe pleural effusion  is unchanged. Compressive atelectasis right lung base also unchanged.  Left lung is clear.  Negative for heart failure  IMPRESSION: Moderate to large loculated right lower lobe pleural effusion and right lower lobe consolidation unchanged. No change from 06/08/2013   Electronically Signed   By: Marlan Palau M.D.   On: 07/11/2013 13:26      Recent Labs: Lab Results  Component Value Date   WBC 6.5 06/28/2013   HGB 10.3* 06/28/2013   HCT 33.9* 06/28/2013   PLT 206 06/28/2013   GLUCOSE 94 06/28/2013   ALT 10 06/28/2013   AST 12 06/28/2013   NA 142 06/28/2013   K 4.1 06/28/2013   CL 103 01/19/2013   CREATININE 0.7 06/28/2013   BUN 8.3 06/28/2013   CO2 25 06/28/2013   TSH 0.234* 12/25/2012   INR 1.06 01/12/2013      Assessment / Plan:  Right-sided malignant effusion. History of previous Pleurx catheter. It is  unclear to me as if he is a surgical candidate for video-assisted thoracoscopy for drainage. I last her office staff to arrange appointment to see Dr. Donata Clay at next available time so as to plan further intervention if he is a candidate. He appears clinically stable at this time although obviously ill.          GOLD,WAYNE E 07/11/2013 1:47 PM

## 2013-07-12 ENCOUNTER — Telehealth: Payer: Self-pay | Admitting: Oncology

## 2013-07-12 ENCOUNTER — Ambulatory Visit (HOSPITAL_BASED_OUTPATIENT_CLINIC_OR_DEPARTMENT_OTHER): Payer: Medicaid Other | Admitting: Oncology

## 2013-07-12 DIAGNOSIS — C3490 Malignant neoplasm of unspecified part of unspecified bronchus or lung: Secondary | ICD-10-CM

## 2013-07-12 DIAGNOSIS — K228 Other specified diseases of esophagus: Secondary | ICD-10-CM

## 2013-07-12 DIAGNOSIS — J910 Malignant pleural effusion: Secondary | ICD-10-CM

## 2013-07-12 DIAGNOSIS — J449 Chronic obstructive pulmonary disease, unspecified: Secondary | ICD-10-CM

## 2013-07-12 DIAGNOSIS — I10 Essential (primary) hypertension: Secondary | ICD-10-CM

## 2013-07-12 MED ORDER — ALBUTEROL SULFATE HFA 108 (90 BASE) MCG/ACT IN AERS: 2.0000 | INHALATION_SPRAY | RESPIRATORY_TRACT | Status: DC | PRN

## 2013-07-12 NOTE — Progress Notes (Signed)
Hematology and Oncology Follow Up Visit  Geoffrey Richardson [id number] September 05, 1960 52 y.o. 07/12/2013 12:13 PM Geoffrey Richardson, MDShadad, [name], MD   Principle Diagnosis: 52 year old gentleman with diagnosis of malignant pleural effusion noted in May of 2014. At that time he presented with weakness, right chest pain and found to have pleural effusion. The cytology is suggesting malignant effusion possibly non-small cell cancer.  Prior Therapy: Patient is status post pleural catheter insertion that was done on 01/15/2013. Chemotherapy with Carboplatin and Taxotere on 02/17/13. He is S/P 6 cycles completed on 06/02/2013.   Current therapy: Observation and follow up.   Interim History: Geoffrey Richardson is a pleasant 52 year old gentleman  presented with malignant pleural effusion status post thoracentesis with the cytology revealing non-small cell cancer histology. He completed chemotherapy without complications. He still oxygen dependent (2-3L/min) and has improved mobility. He still has pain around the Pleurex catheter site and controlled now with pain medications. He is not reporting any fevers or chills. Has not reported any change in his respiratory status. He is eating better his weight has improved. He has not reported any new problems since the last visit. He reports less shortness of breath with rest and does not require oxygen but his exercise tolerance is very limited and does require oxygen when he is ambulatory.   Medications: I have reviewed the patient's current medications.  Current Outpatient Prescriptions  Medication Sig Dispense Refill  . albuterol (PROVENTIL HFA;VENTOLIN HFA) 108 (90 BASE) MCG/ACT inhaler Inhale 2 puffs into the lungs every 4 (four) hours as needed for wheezing or shortness of breath.  1 Inhaler  1  . budesonide-formoterol (SYMBICORT) 160-4.5 MCG/ACT inhaler Inhale 2 puffs into the lungs 2 (two) times daily.  1 Inhaler  1  . feeding supplement (ENSURE COMPLETE) LIQD Take 237  mLs by mouth 3 (three) times daily with meals.      Marland Kitchen guaiFENesin (MUCINEX) 600 MG 12 hr tablet Take by mouth 2 (two) times daily.      Marland Kitchen HYDROcodone-acetaminophen (NORCO/VICODIN) 5-325 MG per tablet Take 1-2 tablets by mouth every 6 (six) hours as needed for pain.  60 tablet  0  . metoprolol tartrate (LOPRESSOR) 25 MG tablet Take 1 tablet (25 mg total) by mouth 2 (two) times daily.  60 tablet  1  . ondansetron (ZOFRAN) 8 MG tablet Take 1 tablet (8 mg total) by mouth every 8 (eight) hours as needed for nausea.  20 tablet  2  . pantoprazole (PROTONIX) 40 MG tablet Take 1 tablet (40 mg total) by mouth at bedtime.  30 tablet  2  . prochlorperazine (COMPAZINE) 10 MG tablet Take 1 tablet (10 mg total) by mouth every 6 (six) hours as needed.  60 tablet  1   No current facility-administered medications for this visit.     Allergies: No Known Allergies  Past Medical History, Surgical history, Social history, and Family History were reviewed and updated.  Review of Systems:  The remaining ROS negative.  Physical Exam: Blood pressure 123/88, pulse 108, temperature 97.5 F (36.4 C), temperature source Oral, resp. rate 17, height 6' (1.829 m), weight 202 lb 8 oz (91.853 kg), SpO2 100.00%, peak flow 2 L/min. ECOG: 1 General appearance: alert Head: Normocephalic, without obvious abnormality, atraumatic Neck: no adenopathy, no carotid bruit, no JVD, supple, symmetrical, trachea midline and thyroid not enlarged, symmetric, no tenderness/mass/nodules Lymph nodes: Cervical, supraclavicular, and axillary nodes normal. Heart:regular rate and rhythm, S1, S2 normal, no murmur, click, rub or gallop Lung:chest clear, no wheezing,  rales, normal symmetric air entry. He has decreased breath sounds on the right. Abdomen: soft, non-tender, without masses or organomegaly EXT:no erythema, induration, or nodules Neurological: No deficits motor sensory or proprioception. No change in his gait or mentation.  Lab  Results: Lab Results  Component Value Date   WBC 6.5 06/28/2013   HGB 10.3* 06/28/2013   HCT 33.9* 06/28/2013   MCV 89.4 06/28/2013   PLT 206 06/28/2013     Chemistry      Component Value Date/Time   NA 142 06/28/2013 0846   NA 137 01/17/2013 0926   K 4.1 06/28/2013 0846   K 3.8 01/17/2013 0926   CL 103 01/19/2013 1008   CL 97 01/17/2013 0926   CO2 25 06/28/2013 0846   CO2 29 01/17/2013 0926   BUN 8.3 06/28/2013 0846   BUN 17 01/17/2013 0926   CREATININE 0.7 06/28/2013 0846   CREATININE 0.70 01/17/2013 0926      Component Value Date/Time   CALCIUM 9.4 06/28/2013 0846   CALCIUM 8.7 01/17/2013 0926   ALKPHOS 100 06/28/2013 0846   ALKPHOS 93 01/16/2013 0600   AST 12 06/28/2013 0846   AST 13 01/16/2013 0600   ALT 10 06/28/2013 0846   ALT 18 01/16/2013 0600   BILITOT 0.50 06/28/2013 0846   BILITOT 0.3 01/16/2013 0600     EXAM:  CT CHEST, ABDOMEN, AND PELVIS WITH CONTRAST  TECHNIQUE:  Multidetector CT imaging of the chest, abdomen and pelvis was  performed following the standard protocol during bolus  administration of intravenous contrast.  CONTRAST: OMNIPAQUE IOHEXOL 300 MG/ML SOLN  COMPARISON: 06/08/2013 plain film chest. 04/20/2013 CTs.  FINDINGS:  CT CHEST FINDINGS  Lungs/Pleura: Similar mass effect upon right lower and less so right  middle lobe bronchi with distal compression or obstruction. Moderate  centrilobular emphysema.  Clear left lung. There may be minimal increase in the anterior right  lung base collapse/consolidative change. Example image 30 today  versus image 39 of the prior exam transverse. The more posterior  pulmonary opacity is similar. The right apex remains clear.  Loculated right-sided pleural fluid is slightly increased since the  prior. The right-sided pleural drain has been removed. Persistent  pleural thickening and calcification, possibly related to prior  pleurodesis. No left pleural effusion.  Heart/Mediastinum: No supraclavicular adenopathy.  Normal heart size  with mild mediastinal deviation to the left. A pretracheal node  measures 1.1 cm on image 20 versus 1.2 cm on the prior  Precarinal node measures 1.3 cm on image 25 versus 1.2 cm on the  prior. No hilar adenopathy.  CT ABDOMEN AND PELVIS FINDINGS  Abdomen/Pelvis: Normal liver, spleen, stomach, pancreas,  gallbladder, biliary tract, adrenal glands.  Right renal cyst of approximately 12 mm. Right renal collecting  system calculi. Normal left kidney.  No retroperitoneal or retrocrural adenopathy. Normal colon and  terminal ileum. Normal small bowel without abdominal ascites. No  evidence of omental or peritoneal disease.  Tiny fat containing left inguinal hernia. No pelvic adenopathy.  Normal urinary bladder and prostate.  Bones/Musculoskeletal: No acute osseous abnormality.  IMPRESSION:  CT CHEST IMPRESSION  1. Similar thoracic adenopathy.  2. Slight increase in loculated right-sided pleural effusion.  Resultant increase in mass effect with minimal mediastinal deviation  to the left.  3. Underlying right lung base collapse/consolidative change. Likely  minimal increased. Favor atelectasis. Concurrent infection or  neoplasm cannot be excluded.  CT ABDOMEN AND PELVIS IMPRESSION  1. No acute process or evidence of metastatic disease  in the abdomen  or pelvis.  2. Right nephrolithiasis.   Impression and Plan: 52 year old with the following issues:   1. 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 He is status post pleural catheter inserted on 01/15/2013. He is S/P systemic chemotherapy with Carboplatin and Taxotere. CT scan from 07/01/2013 was reviewed today with the patient and showed stable disease without any clear-cut progression. The plan at this point is to continue with observation and surveillance and that he initiate systemic chemotherapy upon progression. I will repeat his CT scan in 3 months.  2. Esophageal focal narrowing .  I doubt this is malignancy. He is asymptomatic at this point. A PET/CT scan failed to show anything to suggest esophageal malignancy.  3. Nausea/vomiting prophylaxis. I have refilled his Compazine and have given him Zofran as well.   4. Pleural effusion with a Pleurx catheter: His catheter have been removed without any major changes in his fluids.  5. COPD: He is on Albuterol PRN. I have refilled his Symbicort as he is completely out of this medication. On O2 2-3 L.  6. HTN. BP better today.  7. Pain control. He has pain due to the Pleurex catheter. On Vicodin now which I have refilled today.  8. Neutropenia prophylaxis. No longer needed at this time.  9. Follow-up. In 6 weeks.   Geoffrey Richardson 12/2/201412:13 PM

## 2013-07-12 NOTE — Telephone Encounter (Signed)
Gave pt appt for lab and MD on January 2015 °

## 2013-07-14 ENCOUNTER — Ambulatory Visit: Payer: Medicaid Other | Admitting: Cardiothoracic Surgery

## 2013-07-15 ENCOUNTER — Other Ambulatory Visit: Payer: Self-pay | Admitting: *Deleted

## 2013-07-15 ENCOUNTER — Ambulatory Visit (INDEPENDENT_AMBULATORY_CARE_PROVIDER_SITE_OTHER): Payer: Medicaid Other | Admitting: Cardiothoracic Surgery

## 2013-07-15 ENCOUNTER — Encounter: Payer: Self-pay | Admitting: Cardiothoracic Surgery

## 2013-07-15 ENCOUNTER — Ambulatory Visit
Admission: RE | Admit: 2013-07-15 | Discharge: 2013-07-15 | Disposition: A | Discharge status: Home / Self Care | Payer: Medicaid Other | Source: Ambulatory Visit | Attending: Cardiothoracic Surgery | Admitting: Cardiothoracic Surgery

## 2013-07-15 VITALS — BP 142/87 | HR 113 | Resp 20 | Ht 72.0 in | Wt 202.0 lb

## 2013-07-15 DIAGNOSIS — J90 Pleural effusion, not elsewhere classified: Secondary | ICD-10-CM

## 2013-07-15 DIAGNOSIS — J910 Malignant pleural effusion: Secondary | ICD-10-CM

## 2013-07-16 NOTE — Progress Notes (Signed)
PCP is Eli Hose, MD Referring Provider is [name], MD  Chief Complaint  Patient presents with  . 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 The patient has near total collapse of the right lower lobe from cancer and radiation therapy with associated loculated effusion. There is some upward compression of the right upper lobe which is mild,  left lung is clear.  Patient is tolerating home activity with nasal cannula oxygen. At rest he is basically asymptomatic. With activity or ambulation he does become short of breath and must stop and rest.  The patient's main complaint at this time is coughing especially at night and expectoration of orange colored material. The patient was seen by primary care and felt to have bronchitis but no medication prescribed.  The patient as been able to maintain weight and overall strength. He denies fever. He complains of post thoracotomy pain  Past Medical History  Diagnosis Date  . Tobacco abuse     Began age 17  . Pleural effusion on right   . COPD (chronic obstructive pulmonary disease)   . Hypertension     pt states it fluctuates but not on any medications  . Emphysema   . Shortness of breath     lying/sitting/exertion  . Headache(784.0)     occasionally  . GERD (gastroesophageal reflux disease)     takes Protonix daily  . Nocturia   . Hypothyroidism     but not on medication at the present time  . Insomnia   . Lung cancer 02/04/2013    Past Surgical History  Procedure Laterality Date  . Appendectomy      as a child  . Video assisted  thoracoscopy (vats) w/talc pleuadesis Right 01/14/2013    Procedure: VIDEO ASSISTED THORACOSCOPY (VATS) W/TALC PLEUADESIS;  Surgeon: Kerin Perna, MD;  Location: [facility] OR;  Service: Thoracic;  Laterality: Right;  . Chest tube insertion Right 01/14/2013    Procedure: INSERTION PLEURAL DRAINAGE CATHETER;  Surgeon: Kerin Perna, MD;  Location: [facility] OR;  Service: Thoracic;  Laterality: Right;  . Removal of pleural drainage catheter N/A 04/22/2013    Procedure: MINOR REMOVAL OF PLEURAL DRAINAGE CATHETER;  Surgeon: Kerin Perna, MD;  Location: [facility] OR;  Service: Thoracic;  Laterality: N/A;    Family History  Problem Relation Age of Onset  . ALS Mother     Deceased 52  . Other Father     Deceased from [facility], hit by drunk driver.  Hx of prostate issues    Social History History  Substance Use Topics  . Smoking status: Former Smoker -- 2.00 packs/day for 36 years    Types: Cigarettes  . Smokeless tobacco: Never Used     Comment: no smoking since 12/24/12 states had smoked since age 89  . Alcohol Use: Yes     Comment: occasionally    Current Outpatient Prescriptions  Medication Sig Dispense Refill  . albuterol (PROVENTIL HFA;VENTOLIN HFA) 108 (90 BASE) MCG/ACT inhaler Inhale 2 puffs into the lungs every 4 (four) hours as needed for wheezing or shortness of breath.  1 Inhaler  1  .  budesonide-formoterol (SYMBICORT) 160-4.5 MCG/ACT inhaler Inhale 2 puffs into the lungs 2 (two) times daily.  1 Inhaler  1  . feeding supplement (ENSURE COMPLETE) LIQD Take 237 mLs by mouth 3 (three) times daily with meals.      Marland Kitchen guaiFENesin (MUCINEX) 600 MG 12 hr tablet Take by mouth 2 (two) times daily.      Marland Kitchen HYDROcodone-acetaminophen (NORCO/VICODIN) 5-325 MG per tablet Take 1-2 tablets by mouth every 6 (six) hours as needed for pain.  60 tablet  0  . metoprolol tartrate (LOPRESSOR) 25 MG tablet Take 1 tablet (25 mg total) by mouth 2 (two) times daily.  60 tablet  1  . ondansetron (ZOFRAN) 8 MG tablet Take 1 tablet  (8 mg total) by mouth every 8 (eight) hours as needed for nausea.  20 tablet  2  . pantoprazole (PROTONIX) 40 MG tablet Take 1 tablet (40 mg total) by mouth at bedtime.  30 tablet  2  . prochlorperazine (COMPAZINE) 10 MG tablet Take 1 tablet (10 mg total) by mouth every 6 (six) hours as needed.  60 tablet  1   No current facility-administered medications for this visit.    No Known Allergies  Review of Systems no fever, severe cough especially at night  BP 142/87  Pulse 113  Resp 20  Ht 6' (1.829 m)  Wt 202 lb (91.627 kg)  BMI 27.39 kg/m2  SpO2 93% Physical Exam Alert and appropriate Breath sounds reduced on the right Right minithoracotomy well-healed slight tachycardia  Diagnostic Tests: Chest x-rays reviewed Impression: Complete and permanent collapse of right lower lobe it to central obstructing cancer and subsequent radiation. Large loculated pleural effusion as a consequence to fill the space with only mild leftward shift or compression of right upper lobe. I would not recommend surgery at this time is drainage of the effusion would not reexpand the entrapped right lower lobe and further intervention could result in an infected pleural space  As far as his cough I prescribed Tussionex and a prednisone taper.  Plan: Return with chest x-ray 4-6 weeks

## 2013-07-25 ENCOUNTER — Other Ambulatory Visit: Payer: Self-pay | Admitting: Medical Oncology

## 2013-07-25 MED ORDER — HYDROCODONE-ACETAMINOPHEN 5-325 MG PO TABS: 1.0000 | ORAL_TABLET | Freq: Four times a day (QID) | ORAL | Status: DC | PRN

## 2013-07-25 NOTE — Telephone Encounter (Signed)
LVMOM with patient for pain medication prescription refill of norco/vicodin 5-325 mg ready for p.u with injection nurse. Patient to call back with any questions or concerns.   Dr Clelia Croft out of office, prescription signed by Dr. Truett Perna.

## 2013-08-23 ENCOUNTER — Other Ambulatory Visit: Payer: Medicaid Other

## 2013-08-23 ENCOUNTER — Ambulatory Visit: Payer: Medicaid Other | Admitting: Oncology

## 2013-08-24 ENCOUNTER — Ambulatory Visit: Payer: Medicaid Other | Admitting: Cardiothoracic Surgery

## 2013-08-29 ENCOUNTER — Other Ambulatory Visit: Payer: Self-pay | Admitting: *Deleted

## 2013-08-29 DIAGNOSIS — C3490 Malignant neoplasm of unspecified part of unspecified bronchus or lung: Secondary | ICD-10-CM

## 2013-08-30 ENCOUNTER — Telehealth: Payer: Self-pay | Admitting: Oncology

## 2013-08-30 NOTE — Telephone Encounter (Signed)
Pt called, returned call , r/s lab and MD to 2/3/ 15 pt aware of appt

## 2013-08-31 ENCOUNTER — Ambulatory Visit
Admission: RE | Admit: 2013-08-31 | Discharge: 2013-08-31 | Disposition: A | Discharge status: Home / Self Care | Payer: Medicaid Other | Source: Ambulatory Visit | Attending: Cardiothoracic Surgery | Admitting: Cardiothoracic Surgery

## 2013-08-31 ENCOUNTER — Encounter: Payer: Self-pay | Admitting: Cardiothoracic Surgery

## 2013-08-31 ENCOUNTER — Ambulatory Visit (INDEPENDENT_AMBULATORY_CARE_PROVIDER_SITE_OTHER): Payer: Medicaid Other | Admitting: Cardiothoracic Surgery

## 2013-08-31 VITALS — BP 120/88 | HR 105 | Resp 16 | Ht 72.0 in | Wt 202.0 lb

## 2013-08-31 DIAGNOSIS — C3490 Malignant neoplasm of unspecified part of unspecified bronchus or lung: Secondary | ICD-10-CM

## 2013-08-31 DIAGNOSIS — J90 Pleural effusion, not elsewhere classified: Secondary | ICD-10-CM

## 2013-08-31 NOTE — Progress Notes (Signed)
PCP is No primary provider on file. Referring Provider is Wyatt Portela, MD  Chief Complaint  Patient presents with  . Pleural Effusion    right 1 wk f/u with cxr    HPI: Routine 4 week followup of unfortunate 53 year old gentleman with advanced carcinoma right lower lobe. The patient has a stable effusion and a previously placed Pleurx catheter has been removed when stopped draining. A new catheter was not inserted because the right lower lobe was entrapped and will not fill the space which has been stable with his fusion. He remains able to aerate his right upper lobe and left lung. He is on oxygen at home especially with exertion.  Recently he has developed some pain and loss of range of motion in the left shoulder. He has a CT scan of the chest pending in the near future with his oncologist. He is not receiving chemotherapy at this time.   Past Medical History  Diagnosis Date  . Tobacco abuse     Began age 80  . Pleural effusion on right   . COPD (chronic obstructive pulmonary disease)   . Hypertension     pt states it fluctuates but not on any medications  . Emphysema   . Shortness of breath     lying/sitting/exertion  . Headache(784.0)     occasionally  . GERD (gastroesophageal reflux disease)     takes Protonix daily  . Nocturia   . Hypothyroidism     but not on medication at the present time  . Insomnia   . Lung cancer 02/04/2013    Past Surgical History  Procedure Laterality Date  . Appendectomy      as a child  . Video assisted thoracoscopy (vats) w/talc pleuadesis Right 01/14/2013    Procedure: VIDEO ASSISTED THORACOSCOPY (VATS) W/TALC PLEUADESIS;  Surgeon: Ivin Poot, MD;  Location: [place];  Service: Thoracic;  Laterality: Right;  . Chest tube insertion Right 01/14/2013    Procedure: INSERTION PLEURAL DRAINAGE CATHETER;  Surgeon: Ivin Poot, MD;  Location: [place];  Service: Thoracic;  Laterality: Right;  . Removal of pleural drainage catheter N/A 04/22/2013     Procedure: MINOR REMOVAL OF PLEURAL DRAINAGE CATHETER;  Surgeon: Ivin Poot, MD;  Location: [facility] OR;  Service: Thoracic;  Laterality: N/A;    Family History  Problem Relation Age of Onset  . ALS Mother     Deceased 60  . Other Father     Deceased from [facility], hit by drunk driver.  Hx of prostate issues    Social History History  Substance Use Topics  . Smoking status: Former Smoker -- 2.00 packs/day for 36 years    Types: Cigarettes  . Smokeless tobacco: Never Used     Comment: no smoking since 12/24/12 states had smoked since age 53  . Alcohol Use: Yes     Comment: occasionally    Current Outpatient Prescriptions  Medication Sig Dispense Refill  . albuterol (PROVENTIL HFA;VENTOLIN HFA) 108 (90 BASE) MCG/ACT inhaler Inhale 2 puffs into the lungs every 4 (four) hours as needed for wheezing or shortness of breath.  1 Inhaler  1  . budesonide-formoterol (SYMBICORT) 160-4.5 MCG/ACT inhaler Inhale 2 puffs into the lungs 2 (two) times daily.  1 Inhaler  1  . feeding supplement (ENSURE COMPLETE) LIQD Take 237 mLs by mouth 3 (three) times daily with meals.      Marland Kitchen guaiFENesin (MUCINEX) 600 MG 12 hr tablet Take by mouth 2 (two) times daily.      Marland Kitchen  HYDROcodone-acetaminophen (NORCO/VICODIN) 5-325 MG per tablet Take 1-2 tablets by mouth every 6 (six) hours as needed.  60 tablet  0  . metoprolol tartrate (LOPRESSOR) 25 MG tablet Take 1 tablet (25 mg total) by mouth 2 (two) times daily.  60 tablet  1  . ondansetron (ZOFRAN) 8 MG tablet Take 1 tablet (8 mg total) by mouth every 8 (eight) hours as needed for nausea.  20 tablet  2  . pantoprazole (PROTONIX) 40 MG tablet Take 1 tablet (40 mg total) by mouth at bedtime.  30 tablet  2  . prochlorperazine (COMPAZINE) 10 MG tablet Take 1 tablet (10 mg total) by mouth every 6 (six) hours as needed.  60 tablet  1   No current facility-administered medications for this visit.    No Known Allergies  Review of Systems doing remarkably well no changes  except for left shoulder discomfort  BP 120/88  Pulse 105  Resp 16  Ht 6' (1.829 m)  Wt 202 lb (91.627 kg)  BMI 27.39 kg/m2  SpO2 95% Physical Exam Alert and pleasant Diminished breath sounds at right base Heart rate rapid but without murmur or gallop Limited range of motion of left upper extremity and shoulder pain  Diagnostic Tests:  Chest x-ray no change, right upper lobe remains aerated but rest of hemithorax with loculated effusion and collapse right lower and middle lobes from cancer Impression: His cough is returning-he responded well to prednisone taper previously so we'll reinstitute another course. He complains of insomnia and will be given a prescription for Ambien. His inhalers he did be refilled-Symbicort and albuterol  Plan: Return for routine followup in approximately 4 weeks to assess pleural effusion

## 2013-09-13 ENCOUNTER — Other Ambulatory Visit (HOSPITAL_BASED_OUTPATIENT_CLINIC_OR_DEPARTMENT_OTHER): Payer: Medicaid Other

## 2013-09-13 ENCOUNTER — Telehealth: Payer: Self-pay | Admitting: Oncology

## 2013-09-13 ENCOUNTER — Encounter: Payer: Self-pay | Admitting: Oncology

## 2013-09-13 ENCOUNTER — Ambulatory Visit (HOSPITAL_BASED_OUTPATIENT_CLINIC_OR_DEPARTMENT_OTHER): Payer: Medicaid Other | Admitting: Oncology

## 2013-09-13 VITALS — BP 136/88 | HR 95 | Temp 97.4°F | Resp 18 | Ht 72.0 in | Wt 203.6 lb

## 2013-09-13 DIAGNOSIS — J449 Chronic obstructive pulmonary disease, unspecified: Secondary | ICD-10-CM

## 2013-09-13 DIAGNOSIS — I10 Essential (primary) hypertension: Secondary | ICD-10-CM

## 2013-09-13 DIAGNOSIS — J910 Malignant pleural effusion: Secondary | ICD-10-CM

## 2013-09-13 DIAGNOSIS — C3490 Malignant neoplasm of unspecified part of unspecified bronchus or lung: Secondary | ICD-10-CM

## 2013-09-13 LAB — COMPREHENSIVE METABOLIC PANEL (CC13)
ALBUMIN: 3.1 g/dL — AB (ref 3.5–5.0)
ALK PHOS: 97 U/L (ref 40–150)
ALT: 13 U/L (ref 0–55)
ANION GAP: 11 meq/L (ref 3–11)
AST: 12 U/L (ref 5–34)
BUN: 12.2 mg/dL (ref 7.0–26.0)
CHLORIDE: 108 meq/L (ref 98–109)
CO2: 25 meq/L (ref 22–29)
Calcium: 9.7 mg/dL (ref 8.4–10.4)
Creatinine: 0.8 mg/dL (ref 0.7–1.3)
GLUCOSE: 100 mg/dL (ref 70–140)
Potassium: 3.7 mEq/L (ref 3.5–5.1)
SODIUM: 144 meq/L (ref 136–145)
Total Bilirubin: 0.27 mg/dL (ref 0.20–1.20)
Total Protein: 8.2 g/dL (ref 6.4–8.3)

## 2013-09-13 LAB — CBC WITH DIFFERENTIAL/PLATELET
BASO%: 0.8 % (ref 0.0–2.0)
Basophils Absolute: 0 10*3/uL (ref 0.0–0.1)
EOS ABS: 0 10*3/uL (ref 0.0–0.5)
EOS%: 0.4 % (ref 0.0–7.0)
HCT: 37.6 % — ABNORMAL LOW (ref 38.4–49.9)
HGB: 11.7 g/dL — ABNORMAL LOW (ref 13.0–17.1)
LYMPH#: 1.6 10*3/uL (ref 0.9–3.3)
LYMPH%: 26.4 % (ref 14.0–49.0)
MCH: 26.2 pg — ABNORMAL LOW (ref 27.2–33.4)
MCHC: 31.1 g/dL — ABNORMAL LOW (ref 32.0–36.0)
MCV: 84.2 fL (ref 79.3–98.0)
MONO#: 0.5 10*3/uL (ref 0.1–0.9)
MONO%: 8.7 % (ref 0.0–14.0)
NEUT%: 63.7 % (ref 39.0–75.0)
NEUTROS ABS: 4 10*3/uL (ref 1.5–6.5)
Platelets: 237 10*3/uL (ref 140–400)
RBC: 4.46 10*6/uL (ref 4.20–5.82)
RDW: 18.8 % — AB (ref 11.0–14.6)
WBC: 6.2 10*3/uL (ref 4.0–10.3)

## 2013-09-13 NOTE — Progress Notes (Signed)
Hematology and Oncology Follow Up Visit  Geoffrey Richardson [id number] 1961-03-27 52 y.o. 09/13/2013 8:55 AM Nolene Ebbs A, MDNo ref. 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 At that time he presented with weakness, right chest pain and found to have pleural effusion. The cytology is suggesting malignant effusion possibly non-small cell cancer.  Prior Therapy: Patient is status post pleural catheter insertion that was done on 01/15/2013. Chemotherapy with Carboplatin and Taxotere on 02/17/13. He is S/P 6 cycles completed on 06/02/2013.   Current therapy: Observation and follow up.   Interim History: Geoffrey Richardson is a pleasant 53 year old gentleman  presented with malignant pleural effusion status post thoracentesis with the cytology revealing non-small cell cancer histology. He completed chemotherapy without complications. He still oxygen dependent (2-3L/min) and has improved mobility. He still has pain around the Pleurex catheter site and controlled now with pain medications. He is not reporting any fevers or chills. Has not reported any change in his respiratory status. He is eating better his weight has improved. He has not reported any new problems since the last visit. He reports less shortness of breath with rest and does not require oxygen but his exercise tolerance is very limited and does require oxygen when he is ambulatory. He continues to improve and do more off oxygen. He was able to walk an extended period of time without oxygen but felt slightly dizzy afterwards but recovered quickly.   Medications: I have reviewed the patient's current medications.  Current Outpatient Prescriptions  Medication Sig Dispense Refill  . albuterol (PROVENTIL HFA;VENTOLIN HFA) 108 (90 BASE) MCG/ACT inhaler Inhale 2 puffs into the lungs every 4 (four) hours as needed for wheezing or shortness of breath.  1 Inhaler  1  .  budesonide-formoterol (SYMBICORT) 160-4.5 MCG/ACT inhaler Inhale 2 puffs into the lungs 2 (two) times daily.  1 Inhaler  1  . feeding supplement (ENSURE COMPLETE) LIQD Take 237 mLs by mouth 3 (three) times daily with meals.      Marland Kitchen guaiFENesin (MUCINEX) 600 MG 12 hr tablet Take by mouth 2 (two) times daily.      Marland Kitchen HYDROcodone-acetaminophen (NORCO/VICODIN) 5-325 MG per tablet Take 1-2 tablets by mouth every 6 (six) hours as needed.  60 tablet  0  . metoprolol tartrate (LOPRESSOR) 25 MG tablet Take 1 tablet (25 mg total) by mouth 2 (two) times daily.  60 tablet  1  . ondansetron (ZOFRAN) 8 MG tablet Take 1 tablet (8 mg total) by mouth every 8 (eight) hours as needed for nausea.  20 tablet  2  . pantoprazole (PROTONIX) 40 MG tablet Take 1 tablet (40 mg total) by mouth at bedtime.  30 tablet  2  . prochlorperazine (COMPAZINE) 10 MG tablet Take 1 tablet (10 mg total) by mouth every 6 (six) hours as needed.  60 tablet  1   No current facility-administered medications for this visit.     Allergies: No Known Allergies  Past Medical History, Surgical history, Social history, and Family History were reviewed and updated.  Review of Systems:  The remaining ROS negative.  Physical Exam: Blood pressure 136/88, pulse 95, temperature 97.4 F (36.3 C), temperature source Oral, resp. rate 18, height 6' (1.829 m), weight 203 lb 9.6 oz (92.352 kg). ECOG: 1 General appearance: alert Head: Normocephalic, without obvious abnormality, atraumatic Neck: no adenopathy, no carotid bruit, no JVD, supple, symmetrical, trachea midline and thyroid not enlarged, symmetric, no tenderness/mass/nodules Lymph nodes: Cervical,  supraclavicular, and axillary nodes normal. Heart:regular rate and rhythm, S1, S2 normal, no murmur, click, rub or gallop Lung:chest clear, no wheezing, rales, normal symmetric air entry. He has decreased breath sounds on the right. Abdomen: soft, non-tender, without masses or organomegaly EXT:no  erythema, induration, or nodules Neurological: No deficits motor sensory or proprioception. No change in his gait or mentation.  Lab Results: Lab Results  Component Value Date   WBC 6.2 09/13/2013   HGB 11.7* 09/13/2013   HCT 37.6* 09/13/2013   MCV 84.2 09/13/2013   PLT 237 09/13/2013     Chemistry      Component Value Date/Time   NA 142 06/28/2013 0846   NA 137 01/17/2013 0926   K 4.1 06/28/2013 0846   K 3.8 01/17/2013 0926   CL 103 01/19/2013 1008   CL 97 01/17/2013 0926   CO2 25 06/28/2013 0846   CO2 29 01/17/2013 0926   BUN 8.3 06/28/2013 0846   BUN 17 01/17/2013 0926   CREATININE 0.7 06/28/2013 0846   CREATININE 0.70 01/17/2013 0926      Component Value Date/Time   CALCIUM 9.4 06/28/2013 0846   CALCIUM 8.7 01/17/2013 0926   ALKPHOS 100 06/28/2013 0846   ALKPHOS 93 01/16/2013 0600   AST 12 06/28/2013 0846   AST 13 01/16/2013 0600   ALT 10 06/28/2013 0846   ALT 18 01/16/2013 0600   BILITOT 0.50 06/28/2013 0846   BILITOT 0.3 01/16/2013 0600       Impression and Plan: 53 year old with the following issues:   1. 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 He is status post pleural catheter inserted on 01/15/2013. He is S/P systemic chemotherapy with Carboplatin and Taxotere. CT scan from 07/01/2013  showed stable disease without any clear-cut progression. The plan at this point is to continue with observation and surveillance and that he initiate systemic chemotherapy upon progression. I will repeat his CT scan in 3 months.  2. Esophageal focal narrowing . I doubt this is malignancy. He is asymptomatic at this point. A PET/CT scan failed to show anything to suggest esophageal malignancy.  3. Nausea/vomiting: Ms. have resolved now.  4. Pleural effusion with a Pleurx catheter: His catheter have been removed without any major changes in his fluids.  5. COPD: He is on Albuterol PRN. I have refilled his Symbicort as he is completely out of this medication. On O2 2-3  L.  6. HTN. BP better today.  7. Pain control. He has pain due to the Pleurex catheter. Pain continued to improve.  8. Neutropenia prophylaxis. No longer needed at this time.  9. Follow-up. In 3 months,   Geoffrey Richardson 2/3/20158:55 AM

## 2013-09-13 NOTE — Telephone Encounter (Signed)
Gave pt appt for lab and MD for MAy 2015, gave pt oral contrast for CT

## 2013-09-23 ENCOUNTER — Other Ambulatory Visit: Payer: Self-pay | Admitting: *Deleted

## 2013-09-23 DIAGNOSIS — J90 Pleural effusion, not elsewhere classified: Secondary | ICD-10-CM

## 2013-09-28 ENCOUNTER — Ambulatory Visit
Admission: RE | Admit: 2013-09-28 | Discharge: 2013-09-28 | Disposition: A | Discharge status: Home / Self Care | Payer: Medicaid Other | Source: Ambulatory Visit | Attending: Cardiothoracic Surgery | Admitting: Cardiothoracic Surgery

## 2013-09-28 ENCOUNTER — Ambulatory Visit: Payer: Medicaid Other | Admitting: Cardiothoracic Surgery

## 2013-09-28 DIAGNOSIS — J90 Pleural effusion, not elsewhere classified: Secondary | ICD-10-CM

## 2013-10-05 ENCOUNTER — Other Ambulatory Visit: Payer: Self-pay | Admitting: *Deleted

## 2013-10-05 DIAGNOSIS — J90 Pleural effusion, not elsewhere classified: Secondary | ICD-10-CM

## 2013-10-07 ENCOUNTER — Ambulatory Visit: Payer: Medicaid Other | Admitting: Cardiothoracic Surgery

## 2013-10-18 ENCOUNTER — Ambulatory Visit
Admission: RE | Admit: 2013-10-18 | Discharge: 2013-10-18 | Disposition: A | Discharge status: Home / Self Care | Payer: Medicaid Other | Source: Ambulatory Visit | Attending: Cardiothoracic Surgery | Admitting: Cardiothoracic Surgery

## 2013-10-18 ENCOUNTER — Ambulatory Visit (INDEPENDENT_AMBULATORY_CARE_PROVIDER_SITE_OTHER): Payer: Medicaid Other | Admitting: Cardiothoracic Surgery

## 2013-10-18 ENCOUNTER — Encounter: Payer: Self-pay | Admitting: Cardiothoracic Surgery

## 2013-10-18 VITALS — BP 128/87 | HR 106 | Resp 16 | Ht 72.0 in | Wt 208.5 lb

## 2013-10-18 DIAGNOSIS — J910 Malignant pleural effusion: Secondary | ICD-10-CM

## 2013-10-18 DIAGNOSIS — J90 Pleural effusion, not elsewhere classified: Secondary | ICD-10-CM

## 2013-10-18 DIAGNOSIS — C3490 Malignant neoplasm of unspecified part of unspecified bronchus or lung: Secondary | ICD-10-CM

## 2013-10-18 DIAGNOSIS — Z09 Encounter for follow-up examination after completed treatment for conditions other than malignant neoplasm: Secondary | ICD-10-CM

## 2013-10-18 NOTE — Progress Notes (Signed)
PCP is Philis Fendt, MD Referring Provider is Philis Fendt, MD  Chief Complaint  Patient presents with  . Routine Post Op    4 wk f/u with cxr    HPI: Routine followup of right malignant effusion, advanced non-small cell carcinoma of the lung and history of prior right Pleurx catheter which was discontinued after drainage ceased. The patient uses oxygen when he is ambulating only. He complains of pain in his left shoulder and epigastrium when he coughs. Is not taking any pain medication and is having difficulty sleeping due to his shoulder pain especially. He also has some residual tenderness at the site of previous Pleurx catheter.  Chest x-ray today shows stable entrapped right lower lobe with an effusion in the space,  left lung clear   Past Medical History  Diagnosis Date  . Tobacco abuse     Began age 32  . Pleural effusion on right   . COPD (chronic obstructive pulmonary disease)   . Hypertension     pt states it fluctuates but not on any medications  . Emphysema   . Shortness of breath     lying/sitting/exertion  . Headache(784.0)     occasionally  . GERD (gastroesophageal reflux disease)     takes Protonix daily  . Nocturia   . Hypothyroidism     but not on medication at the present time  . Insomnia   . Lung cancer 02/04/2013    Past Surgical History  Procedure Laterality Date  . Appendectomy      as a child  . Video assisted thoracoscopy (vats) w/talc pleuadesis Right 01/14/2013    Procedure: VIDEO ASSISTED THORACOSCOPY (VATS) W/TALC PLEUADESIS;  Surgeon: Ivin Poot, MD;  Location: [place];  Service: Thoracic;  Laterality: Right;  . Chest tube insertion Right 01/14/2013    Procedure: INSERTION PLEURAL DRAINAGE CATHETER;  Surgeon: Ivin Poot, MD;  Location: [place];  Service: Thoracic;  Laterality: Right;  . Removal of pleural drainage catheter N/A 04/22/2013    Procedure: MINOR REMOVAL OF PLEURAL DRAINAGE CATHETER;  Surgeon: Ivin Poot, MD;  Location:  [facility] OR;  Service: Thoracic;  Laterality: N/A;    Family History  Problem Relation Age of Onset  . ALS Mother     Deceased 59  . Other Father     Deceased from [facility], hit by drunk driver.  Hx of prostate issues    Social History History  Substance Use Topics  . Smoking status: Former Smoker -- 2.00 packs/day for 36 years    Types: Cigarettes  . Smokeless tobacco: Never Used     Comment: no smoking since 12/24/12 states had smoked since age 79  . Alcohol Use: Yes     Comment: occasionally    Current Outpatient Prescriptions  Medication Sig Dispense Refill  . albuterol (PROVENTIL HFA;VENTOLIN HFA) 108 (90 BASE) MCG/ACT inhaler Inhale 2 puffs into the lungs every 4 (four) hours as needed for wheezing or shortness of breath.  1 Inhaler  1  . budesonide-formoterol (SYMBICORT) 160-4.5 MCG/ACT inhaler Inhale 2 puffs into the lungs 2 (two) times daily.  1 Inhaler  1  . feeding supplement (ENSURE COMPLETE) LIQD Take 237 mLs by mouth 3 (three) times daily with meals.      Marland Kitchen guaiFENesin (MUCINEX) 600 MG 12 hr tablet Take by mouth 2 (two) times daily.      Marland Kitchen HYDROcodone-acetaminophen (NORCO/VICODIN) 5-325 MG per tablet Take 1-2 tablets by mouth every 6 (six) hours as needed.  60 tablet  0  . metoprolol tartrate (LOPRESSOR) 25 MG tablet Take 1 tablet (25 mg total) by mouth 2 (two) times daily.  60 tablet  1  . ondansetron (ZOFRAN) 8 MG tablet Take 1 tablet (8 mg total) by mouth every 8 (eight) hours as needed for nausea.  20 tablet  2  . pantoprazole (PROTONIX) 40 MG tablet Take 1 tablet (40 mg total) by mouth at bedtime.  30 tablet  2  . prochlorperazine (COMPAZINE) 10 MG tablet Take 1 tablet (10 mg total) by mouth every 6 (six) hours as needed.  60 tablet  1   No current facility-administered medications for this visit.    No Known Allergies  Review of Systems weight of 5 pounds Patient is been unable to walk or maintain his conditioning because of poor weather conditions outside this  winter  BP 128/87  Pulse 106  Resp 16  Ht 6' (1.829 m)  Wt 208 lb 8 oz (94.575 kg)  BMI 28.27 kg/m2  SpO2 97% Physical Exam Alert and appears fairly strong today Breath sounds diminished significantly on the right, unchanged No masses palpable neck, no JVD Heart rate increased but without rub or gallop No pedal edema  Diagnostic Tests: Chest x-ray reviewed with patient  Impression: Persistent right effusion from an entrapped right upper lobe. No evidence of empyema No evidence of pericardial effusion or tamponade on exam or x-ray Persistent pain interfering with sleep-Vicodin prescription provided  Plan:Return in 6 weeks for review chest x-ray

## 2013-10-26 ENCOUNTER — Other Ambulatory Visit: Payer: Self-pay | Admitting: *Deleted

## 2013-11-03 ENCOUNTER — Telehealth: Payer: Self-pay | Admitting: *Deleted

## 2013-11-03 ENCOUNTER — Other Ambulatory Visit: Payer: Self-pay | Admitting: *Deleted

## 2013-11-03 MED ORDER — HYDROCODONE-ACETAMINOPHEN 5-325 MG PO TABS: 1.0000 | ORAL_TABLET | Freq: Four times a day (QID) | ORAL | Status: DC | PRN

## 2013-11-03 NOTE — Telephone Encounter (Signed)
Patient calling to request a refill on vicodin, ok per dr [name]. Script left at front for patient p/u. Patient notified.

## 2013-11-29 ENCOUNTER — Encounter (HOSPITAL_COMMUNITY): Payer: Self-pay | Admitting: Emergency Medicine

## 2013-11-29 ENCOUNTER — Emergency Department (HOSPITAL_COMMUNITY): Payer: Medicaid Other

## 2013-11-29 ENCOUNTER — Emergency Department (HOSPITAL_COMMUNITY)
Admission: EM | Admit: 2013-11-29 | Discharge: 2013-11-29 | Disposition: A | Discharge status: Home / Self Care | Payer: Medicaid Other | Attending: Emergency Medicine | Admitting: Emergency Medicine

## 2013-11-29 DIAGNOSIS — Z87891 Personal history of nicotine dependence: Secondary | ICD-10-CM | POA: Insufficient documentation

## 2013-11-29 DIAGNOSIS — R109 Unspecified abdominal pain: Secondary | ICD-10-CM | POA: Insufficient documentation

## 2013-11-29 DIAGNOSIS — Z79899 Other long term (current) drug therapy: Secondary | ICD-10-CM | POA: Insufficient documentation

## 2013-11-29 DIAGNOSIS — Z9089 Acquired absence of other organs: Secondary | ICD-10-CM | POA: Insufficient documentation

## 2013-11-29 DIAGNOSIS — K921 Melena: Secondary | ICD-10-CM | POA: Insufficient documentation

## 2013-11-29 DIAGNOSIS — Z8639 Personal history of other endocrine, nutritional and metabolic disease: Secondary | ICD-10-CM | POA: Insufficient documentation

## 2013-11-29 DIAGNOSIS — J438 Other emphysema: Secondary | ICD-10-CM | POA: Insufficient documentation

## 2013-11-29 DIAGNOSIS — K219 Gastro-esophageal reflux disease without esophagitis: Secondary | ICD-10-CM | POA: Insufficient documentation

## 2013-11-29 DIAGNOSIS — Z862 Personal history of diseases of the blood and blood-forming organs and certain disorders involving the immune mechanism: Secondary | ICD-10-CM | POA: Insufficient documentation

## 2013-11-29 DIAGNOSIS — R110 Nausea: Secondary | ICD-10-CM | POA: Insufficient documentation

## 2013-11-29 DIAGNOSIS — K5900 Constipation, unspecified: Secondary | ICD-10-CM | POA: Insufficient documentation

## 2013-11-29 DIAGNOSIS — I10 Essential (primary) hypertension: Secondary | ICD-10-CM | POA: Insufficient documentation

## 2013-11-29 DIAGNOSIS — Z85118 Personal history of other malignant neoplasm of bronchus and lung: Secondary | ICD-10-CM | POA: Insufficient documentation

## 2013-11-29 LAB — COMPREHENSIVE METABOLIC PANEL
ALT: 10 U/L (ref 0–53)
AST: 21 U/L (ref 0–37)
Albumin: 3.1 g/dL — ABNORMAL LOW (ref 3.5–5.2)
Alkaline Phosphatase: 102 U/L (ref 39–117)
BUN: 16 mg/dL (ref 6–23)
CALCIUM: 9.2 mg/dL (ref 8.4–10.5)
CO2: 24 meq/L (ref 19–32)
Chloride: 103 mEq/L (ref 96–112)
Creatinine, Ser: 0.68 mg/dL (ref 0.50–1.35)
GFR calc Af Amer: >90 mL/min (ref >90)
GFR calc non Af Amer: >90 mL/min (ref >90)
Glucose, Bld: 102 mg/dL — ABNORMAL HIGH (ref 70–99)
Potassium: 4.6 mEq/L (ref 3.7–5.3)
SODIUM: 139 meq/L (ref 137–147)
TOTAL PROTEIN: 8.6 g/dL — AB (ref 6.0–8.3)
Total Bilirubin: 0.4 mg/dL (ref 0.3–1.2)

## 2013-11-29 LAB — URINALYSIS, ROUTINE W REFLEX MICROSCOPIC
Bilirubin Urine: NEGATIVE
GLUCOSE, UA: NEGATIVE mg/dL
Ketones, ur: NEGATIVE mg/dL
Leukocytes, UA: NEGATIVE
Nitrite: NEGATIVE
Protein, ur: NEGATIVE mg/dL
Urobilinogen, UA: 0.2 mg/dL (ref 0.0–1.0)
pH: 5 (ref 5.0–8.0)

## 2013-11-29 LAB — CBC WITH DIFFERENTIAL/PLATELET
Basophils Absolute: 0 10*3/uL (ref 0.0–0.1)
Basophils Relative: 0 % (ref 0–1)
EOS PCT: 0 % (ref 0–5)
Eosinophils Absolute: 0 10*3/uL (ref 0.0–0.7)
HCT: 39.5 % (ref 39.0–52.0)
Hemoglobin: 11.9 g/dL — ABNORMAL LOW (ref 13.0–17.0)
LYMPHS PCT: 25 % (ref 12–46)
Lymphs Abs: 1.6 10*3/uL (ref 0.7–4.0)
MCH: 26.1 pg (ref 26.0–34.0)
MCHC: 30.1 g/dL (ref 30.0–36.0)
MCV: 86.6 fL (ref 78.0–100.0)
Monocytes Absolute: 0.3 10*3/uL (ref 0.1–1.0)
Monocytes Relative: 5 % (ref 3–12)
Neutro Abs: 4.7 10*3/uL (ref 1.7–7.7)
Neutrophils Relative %: 71 % (ref 43–77)
PLATELETS: 286 10*3/uL (ref 150–400)
RBC: 4.56 MIL/uL (ref 4.22–5.81)
RDW: 17.3 % — AB (ref 11.5–15.5)
WBC: 6.6 10*3/uL (ref 4.0–10.5)

## 2013-11-29 LAB — LIPASE, BLOOD: Lipase: 13 U/L (ref 11–59)

## 2013-11-29 LAB — URINE MICROSCOPIC-ADD ON

## 2013-11-29 LAB — POC OCCULT BLOOD, ED: FECAL OCCULT BLD: NEGATIVE

## 2013-11-29 MED ORDER — SODIUM CHLORIDE 0.9 % IV BOLUS (SEPSIS)
1000.0000 mL | Freq: Once | INTRAVENOUS | Status: AC
  Administered 2013-11-29: 1000 mL via INTRAVENOUS

## 2013-11-29 MED ORDER — DOCUSATE SODIUM 100 MG PO CAPS: 100.0000 mg | ORAL_CAPSULE | Freq: Two times a day (BID) | ORAL | Status: DC

## 2013-11-29 MED ORDER — IOHEXOL 300 MG/ML  SOLN
100.0000 mL | Freq: Once | INTRAMUSCULAR | Status: AC | PRN
  Administered 2013-11-29: 100 mL via INTRAVENOUS

## 2013-11-29 MED ORDER — IOHEXOL 300 MG/ML  SOLN
50.0000 mL | Freq: Once | INTRAMUSCULAR | Status: AC | PRN
  Administered 2013-11-29: 50 mL via ORAL

## 2013-11-29 MED ORDER — HYDROMORPHONE HCL PF 1 MG/ML IJ SOLN
1.0000 mg | Freq: Once | INTRAMUSCULAR | Status: AC
Start: 2013-11-29 — End: 2013-11-29
  Administered 2013-11-29: 1 mg via INTRAVENOUS
  Filled 2013-11-29: qty 1

## 2013-11-29 MED ORDER — POLYETHYLENE GLYCOL 3350 17 GM/SCOOP PO POWD: 17.0000 g | Freq: Two times a day (BID) | ORAL | Status: DC

## 2013-11-29 MED ORDER — HYDROCODONE-ACETAMINOPHEN 5-325 MG PO TABS: 1.0000 | ORAL_TABLET | Freq: Four times a day (QID) | ORAL | Status: DC | PRN

## 2013-11-29 NOTE — ED Notes (Signed)
Pt c/o black stool since Sunday, lower abd pain x 1 month. Pt also c/o nausea, last BM on Sunday.

## 2013-11-29 NOTE — ED Notes (Signed)
Asked Pt if he could Void, stated he just went. Provided him Urinal and asked him to let me know when he could get a sample

## 2013-11-29 NOTE — Discharge Instructions (Signed)
Abdominal Pain, Adult Many things can cause abdominal pain. Usually, abdominal pain is not caused by a disease and will improve without treatment. It can often be observed and treated at home. Your health care provider will do a physical exam and possibly order blood tests and X-rays to help determine the seriousness of your pain. However, in many cases, more time must pass before a clear cause of the pain can be found. Before that point, your health care provider may not know if you need more testing or further treatment. HOME CARE INSTRUCTIONS  Monitor your abdominal pain for any changes. The following actions may help to alleviate any discomfort you are experiencing:  Only take over-the-counter or prescription medicines as directed by your health care provider.  Do not take laxatives unless directed to do so by your health care provider.  Try a clear liquid diet (broth, tea, or water) as directed by your health care provider. Slowly move to a bland diet as tolerated. SEEK MEDICAL CARE IF:  You have unexplained abdominal pain.  You have abdominal pain associated with nausea or diarrhea.  You have pain when you urinate or have a bowel movement.  You experience abdominal pain that wakes you in the night.  You have abdominal pain that is worsened or improved by eating food.  You have abdominal pain that is worsened with eating fatty foods. SEEK IMMEDIATE MEDICAL CARE IF:   Your pain does not go away within 2 hours.  You have a fever.  You keep throwing up (vomiting).  Your pain is felt only in portions of the abdomen, such as the right side or the left lower portion of the abdomen.  You pass bloody or black tarry stools. MAKE SURE YOU:  Understand these instructions.   Will watch your condition.   Will get help right away if you are not doing well or get worse.  Document Released: 05/07/2005 Document Revised: 05/18/2013 Document Reviewed: 04/06/2013 [facility] Patient  Information 2014 [place]. Constipation, Adult Constipation is when a person has fewer than 3 bowel movements a week; has difficulty having a bowel movement; or has stools that are dry, hard, or larger than normal. As people grow older, constipation is more common. If you try to fix constipation with medicines that make you have a bowel movement (laxatives), the problem may get worse. Long-term laxative use may cause the muscles of the colon to become weak. A low-fiber diet, not taking in enough fluids, and taking certain medicines may make constipation worse. CAUSES   Certain medicines, such as antidepressants, pain medicine, iron supplements, antacids, and water pills.   Certain diseases, such as diabetes, irritable bowel syndrome (IBS), thyroid disease, or depression.   Not drinking enough water.   Not eating enough fiber-rich foods.   Stress or travel.  Lack of physical activity or exercise.  Not going to the restroom when there is the urge to have a bowel movement.  Ignoring the urge to have a bowel movement.  Using laxatives too much. SYMPTOMS   Having fewer than 3 bowel movements a week.   Straining to have a bowel movement.   Having hard, dry, or larger than normal stools.   Feeling full or bloated.   Pain in the lower abdomen.  Not feeling relief after having a bowel movement. DIAGNOSIS  Your caregiver will take a medical history and perform a physical exam. Further testing may be done for severe constipation. Some tests may include:   A barium enema  X-ray to examine your rectum, colon, and sometimes, your small intestine.  A sigmoidoscopy to examine your lower colon.  A colonoscopy to examine your entire colon. TREATMENT  Treatment will depend on the severity of your constipation and what is causing it. Some dietary treatments include drinking more fluids and eating more fiber-rich foods. Lifestyle treatments may include regular exercise. If these  diet and lifestyle recommendations do not help, your caregiver may recommend taking over-the-counter laxative medicines to help you have bowel movements. Prescription medicines may be prescribed if over-the-counter medicines do not work.  HOME CARE INSTRUCTIONS   Increase dietary fiber in your diet, such as fruits, vegetables, whole grains, and beans. Limit high-fat and processed sugars in your diet, such as [place] fries, hamburgers, cookies, candies, and soda.   A fiber supplement may be added to your diet if you cannot get enough fiber from foods.   Drink enough fluids to keep your urine clear or pale yellow.   Exercise regularly or as directed by your caregiver.   Go to the restroom when you have the urge to go. Do not hold it.  Only take medicines as directed by your caregiver. Do not take other medicines for constipation without talking to your caregiver first. [place] IF:   You have bright red blood in your stool.   Your constipation lasts for more than 4 days or gets worse.   You have abdominal or rectal pain.   You have thin, pencil-like stools.  You have unexplained weight loss. MAKE SURE YOU:   Understand these instructions.  Will watch your condition.  Will get help right away if you are not doing well or get worse. Document Released: 04/25/2004 Document Revised: 10/20/2011 Document Reviewed: 05/09/2013 [facility] Patient Information 2014 [place], [place].

## 2013-11-29 NOTE — ED Provider Notes (Signed)
Medical screening examination/treatment/procedure(s) were performed by non-physician practitioner and as supervising physician I was immediately available for consultation/collaboration.   EKG Interpretation None        Malvin Johns, MD 11/29/13 [phone]

## 2013-11-29 NOTE — ED Notes (Signed)
Patient transported to CT 

## 2013-11-29 NOTE — ED Provider Notes (Signed)
CSN: [id number]     Arrival date & time 11/29/13  4315 History   First MD Initiated Contact with Patient 11/29/13 1039     Chief Complaint  Patient presents with  . Melena  . Abdominal Pain     (Consider location/radiation/quality/duration/timing/severity/associated sxs/prior Treatment) HPI Comments: Patient presents emergency department with chief complaint of abdominal pain. He states that he's been having the pain for the past month. He states that he has a lung cancer patient. He has not currently receiving radiation or chemotherapy. He is followed by Dr. Clydene Laming. he states that he normally has regular bowel movements, however for the past month, he reports only having a couple. States his last bowel movement was black. It was 3 days ago. She reports moderate to severe abdominal tenderness. The pain is not well localized. There no aggravating or alleviating factors. He denies any fevers or chills. Denies any vomiting, but does report nausea.  The history is provided by the patient. No language interpreter was used.    Past Medical History  Diagnosis Date  . Tobacco abuse     Began age 39  . Pleural effusion on right   . COPD (chronic obstructive pulmonary disease)   . Hypertension     pt states it fluctuates but not on any medications  . Emphysema   . Shortness of breath     lying/sitting/exertion  . Headache(784.0)     occasionally  . GERD (gastroesophageal reflux disease)     takes Protonix daily  . Nocturia   . Hypothyroidism     but not on medication at the present time  . Insomnia   . Lung cancer 02/04/2013   Past Surgical History  Procedure Laterality Date  . Appendectomy      as a child  . Video assisted thoracoscopy (vats) w/talc pleuadesis Right 01/14/2013    Procedure: VIDEO ASSISTED THORACOSCOPY (VATS) W/TALC PLEUADESIS;  Surgeon: Ivin Poot, MD;  Location: [place];  Service: Thoracic;  Laterality: Right;  . Chest tube insertion Right 01/14/2013    Procedure:  INSERTION PLEURAL DRAINAGE CATHETER;  Surgeon: Ivin Poot, MD;  Location: [place];  Service: Thoracic;  Laterality: Right;  . Removal of pleural drainage catheter N/A 04/22/2013    Procedure: MINOR REMOVAL OF PLEURAL DRAINAGE CATHETER;  Surgeon: Ivin Poot, MD;  Location: [facility] OR;  Service: Thoracic;  Laterality: N/A;   Family History  Problem Relation Age of Onset  . ALS Mother     Deceased 22  . Other Father     Deceased from [facility], hit by drunk driver.  Hx of prostate issues   History  Substance Use Topics  . Smoking status: Former Smoker -- 2.00 packs/day for 36 years    Types: Cigarettes  . Smokeless tobacco: Never Used     Comment: no smoking since 12/24/12 states had smoked since age 55  . Alcohol Use: Yes     Comment: occasionally    Review of Systems  Constitutional: Negative for fever and chills.  Respiratory: Negative for shortness of breath.   Cardiovascular: Negative for chest pain.  Gastrointestinal: Positive for nausea, abdominal pain, constipation and blood in stool. Negative for vomiting and diarrhea.  Genitourinary: Negative for dysuria.      Allergies  Review of patient's allergies indicates no known allergies.  Home Medications   Prior to Admission medications   Medication Sig Start Date End Date Taking? Authorizing Provider  albuterol (PROVENTIL HFA;VENTOLIN HFA) 108 (90 BASE) MCG/ACT inhaler  Inhale 2 puffs into the lungs every 4 (four) hours as needed for wheezing or shortness of breath. 07/12/13   Wyatt Portela, MD  budesonide-formoterol (SYMBICORT) 160-4.5 MCG/ACT inhaler Inhale 2 puffs into the lungs 2 (two) times daily. 03/10/13   Maryanna Shape, NP  feeding supplement (ENSURE COMPLETE) LIQD Take 237 mLs by mouth 3 (three) times daily with meals. 12/30/12   Domenic Polite, MD  guaiFENesin (MUCINEX) 600 MG 12 hr tablet Take by mouth 2 (two) times daily.    Historical Provider, MD  HYDROcodone-acetaminophen (NORCO/VICODIN) 5-325 MG per tablet Take  1-2 tablets by mouth every 6 (six) hours as needed. 11/03/13   Wyatt Portela, MD  metoprolol tartrate (LOPRESSOR) 25 MG tablet Take 1 tablet (25 mg total) by mouth 2 (two) times daily. 03/10/13   Maryanna Shape, NP  ondansetron (ZOFRAN) 8 MG tablet Take 1 tablet (8 mg total) by mouth every 8 (eight) hours as needed for nausea. 03/10/13   Maryanna Shape, NP  pantoprazole (PROTONIX) 40 MG tablet Take 1 tablet (40 mg total) by mouth at bedtime. 03/10/13   Maryanna Shape, NP  prochlorperazine (COMPAZINE) 10 MG tablet Take 1 tablet (10 mg total) by mouth every 6 (six) hours as needed. 03/10/13   Maryanna Shape, NP   BP 130/94  Pulse 100  Temp(Src) 98.6 F (37 C) (Oral)  Resp 18  SpO2 96% Physical Exam  Nursing note and vitals reviewed. Constitutional: He is oriented to person, place, and time. He appears well-developed and well-nourished.  HENT:  Head: Normocephalic and atraumatic.  Eyes: Conjunctivae and EOM are normal. Pupils are equal, round, and reactive to light. Right eye exhibits no discharge. Left eye exhibits no discharge. No scleral icterus.  Neck: Normal range of motion. Neck supple. No JVD present.  Cardiovascular: Normal rate, regular rhythm and normal heart sounds.  Exam reveals no gallop and no friction rub.   No murmur heard. Pulmonary/Chest: Effort normal and breath sounds normal. No respiratory distress. He has no wheezes. He has no rales. He exhibits no tenderness.  Abdominal: Soft. He exhibits no distension and no mass. There is tenderness. There is guarding. There is no rebound.  Abdomen is diffusely tender to palpation, it is not well localized, no rebound, or masses  Genitourinary:  Rectal exam remarkable for soft brown stool, no internal or external hemorrhoids, fissures, or abscess  Musculoskeletal: Normal range of motion. He exhibits no edema and no tenderness.  Neurological: He is alert and oriented to person, place, and time.  Skin: Skin is warm and dry.   Psychiatric: He has a normal mood and affect. His behavior is normal. Judgment and thought content normal.    ED Course  Procedures (including critical care time) Results for orders placed during the hospital encounter of 11/29/13  CBC WITH DIFFERENTIAL      Result Value Ref Range   WBC 6.6  4.0 - 10.5 K/uL   RBC 4.56  4.22 - 5.81 MIL/uL   Hemoglobin 11.9 (*) 13.0 - 17.0 g/dL   HCT 39.5  39.0 - 52.0 %   MCV 86.6  78.0 - 100.0 fL   MCH 26.1  26.0 - 34.0 pg   MCHC 30.1  30.0 - 36.0 g/dL   RDW 17.3 (*) 11.5 - 15.5 %   Platelets 286  150 - 400 K/uL   Neutrophils Relative % 71  43 - 77 %   Neutro Abs 4.7  1.7 - 7.7 K/uL  Lymphocytes Relative 25  12 - 46 %   Lymphs Abs 1.6  0.7 - 4.0 K/uL   Monocytes Relative 5  3 - 12 %   Monocytes Absolute 0.3  0.1 - 1.0 K/uL   Eosinophils Relative 0  0 - 5 %   Eosinophils Absolute 0.0  0.0 - 0.7 K/uL   Basophils Relative 0  0 - 1 %   Basophils Absolute 0.0  0.0 - 0.1 K/uL  COMPREHENSIVE METABOLIC PANEL      Result Value Ref Range   Sodium 139  137 - 147 mEq/L   Potassium 4.6  3.7 - 5.3 mEq/L   Chloride 103  96 - 112 mEq/L   CO2 24  19 - 32 mEq/L   Glucose, Bld 102 (*) 70 - 99 mg/dL   BUN 16  6 - 23 mg/dL   Creatinine, Ser 0.68  0.50 - 1.35 mg/dL   Calcium 9.2  8.4 - 10.5 mg/dL   Total Protein 8.6 (*) 6.0 - 8.3 g/dL   Albumin 3.1 (*) 3.5 - 5.2 g/dL   AST 21  0 - 37 U/L   ALT 10  0 - 53 U/L   Alkaline Phosphatase 102  39 - 117 U/L   Total Bilirubin 0.4  0.3 - 1.2 mg/dL   GFR calc non Af Amer >90  >90 mL/min   GFR calc Af Amer >90  >90 mL/min  LIPASE, BLOOD      Result Value Ref Range   Lipase 13  11 - 59 U/L  POC OCCULT BLOOD, ED      Result Value Ref Range   Fecal Occult Bld NEGATIVE  NEGATIVE   Ct Abdomen Pelvis W Contrast  11/29/2013   CLINICAL DATA:  Melena. Abdominal pain. History of lung cancer with prior chemotherapy.  EXAM: CT ABDOMEN AND PELVIS WITH CONTRAST  TECHNIQUE: Multidetector CT imaging of the abdomen and pelvis was  performed using the standard protocol following bolus administration of intravenous contrast.  CONTRAST:  169mL OMNIPAQUE IOHEXOL 300 MG/ML  SOLN  COMPARISON:  07/01/2013  FINDINGS: Loculated right pleural effusion is again noted. Compressive atelectasis in the adjacent right lung is COPD changes within the lungs. Left lung bases clear. Heart is upper limits normal in size.  Liver, gallbladder, spleen, pancreas and adrenals are unremarkable. Small nonobstructing stone in the midpole of the right kidney. 16 mm cyst in the midpole of the right kidney. No hydronephrosis.  Stomach and small bowel are unremarkable. Small amount of free fluid present within the pelvis. No focal colonic abnormality or evidence of inflammatory process. No free air or adenopathy. No acute bony abnormality.  Small left inguinal hernia containing fat. Aorta is normal caliber with scattered atherosclerotic calcifications. No acute bony abnormality.  IMPRESSION: Stable loculated right pleural effusion with compressive atelectasis in the adjacent right lung.  COPD.  Small amount of free fluid within the pelvis. This is of unknown etiology. No explanation for the patient's melena seen.   Electronically Signed   By: Rolm Baptise M.D.   On: 11/29/2013 12:31      EKG Interpretation None      MDM   Final diagnoses:  Abdominal pain   Cancer patient with abdominal pain times past month. He has taken Vicodin for the past month, which could be contributing to his constipation. Suspect that the constipation is the reason for the abdominal pain, however given the cancer history, we'll order a CT scan of the abdomen to evaluate for possible  metastases. Will treat pain, check labs, and will reassess.  1:43 PM Labs are unremarkable.  Exam is without focal tenderness, but is diffusely uncomfortable.  Will treat with miralax.  2:32 PM Patient re-examined x3.  Abdomen remains uncomfortable, but no focal tenderness.  Labs and CT reviewed by  myself and Dr. Tamera Punt.  Discussed the patient with Dr. Tamera Punt, and will discharge to home with miralax and colace.  Will also give some vicodin, but urged caution as this can cause worsening constipation.  Patient understands and agrees with the plan.  He is stable and ready for discharge.  Return precautions given. Patient instructed to return for:  New or worsening symptoms, including, increased abdominal pain, especially pain that localizes to one side, bloody vomit, bloody diarrhea, fever >101, and intractable vomiting.     Montine Circle, PA-C 11/29/13 1434

## 2013-11-30 ENCOUNTER — Other Ambulatory Visit: Payer: Self-pay | Admitting: *Deleted

## 2013-11-30 ENCOUNTER — Ambulatory Visit (INDEPENDENT_AMBULATORY_CARE_PROVIDER_SITE_OTHER): Payer: Medicaid Other | Admitting: Cardiothoracic Surgery

## 2013-11-30 ENCOUNTER — Ambulatory Visit
Admission: RE | Admit: 2013-11-30 | Discharge: 2013-11-30 | Disposition: A | Discharge status: Home / Self Care | Payer: Medicaid Other | Source: Ambulatory Visit | Attending: Cardiothoracic Surgery | Admitting: Cardiothoracic Surgery

## 2013-11-30 ENCOUNTER — Encounter: Payer: Self-pay | Admitting: Cardiothoracic Surgery

## 2013-11-30 VITALS — BP 126/92 | HR 100 | Resp 20 | Ht 72.0 in | Wt 208.0 lb

## 2013-11-30 DIAGNOSIS — J90 Pleural effusion, not elsewhere classified: Secondary | ICD-10-CM

## 2013-11-30 DIAGNOSIS — Z09 Encounter for follow-up examination after completed treatment for conditions other than malignant neoplasm: Secondary | ICD-10-CM

## 2013-11-30 DIAGNOSIS — C3490 Malignant neoplasm of unspecified part of unspecified bronchus or lung: Secondary | ICD-10-CM

## 2013-11-30 NOTE — Progress Notes (Signed)
PCP is Philis Fendt, MD Referring Provider is Philis Fendt, MD  Chief Complaint  Patient presents with  . Routine Post Op    6 week f/u with CXR    HPI: The patient returns for routine followup of his long-standing right malignant effusion, advanced stage non-small cell carcinoma lung with chronic atelectasis of the right lower lobe. The patient was previously treated with a Pleurx catheter but it was withdrawn due to the loculated nature of the effusion after drainage stopped after several weeks. The patient's functional status remains surprisingly good and he is not  currently receiving active chemotherapy. He is on home oxygen 2 L per minute .  Earlier this week the patient was evaluated in the emergency department with abdominal pain and tenderness and was felt to be constipated. CT of the abdomen showed stool in the colon and rectum, no evidence of abdominal metastases. The colon is moderately distended. There is a left inguinal hernia with fat content. Lower aspect of the right lung field shows the loculated right pleural effusion with a collapsed right lower lobe from obstructive tumor and then radiation.  Patient's pulmonary status is stable. His left lung and right upper lung fields remained clear and chest x-ray. He is mainly suffering from abdominal discomfort. He states this is epigastric. He has had some melanotic stools. He was not tested for heme in the stool in the ED visit. He has no previous history of ulcer disease but has been taking Naprosyn chronically.  Past Medical History  Diagnosis Date  . Tobacco abuse     Began age 30  . Pleural effusion on right   . COPD (chronic obstructive pulmonary disease)   . Hypertension     pt states it fluctuates but not on any medications  . Emphysema   . Shortness of breath     lying/sitting/exertion  . Headache(784.0)     occasionally  . GERD (gastroesophageal reflux disease)     takes Protonix daily  . Nocturia   .  Hypothyroidism     but not on medication at the present time  . Insomnia   . Lung cancer 02/04/2013    Past Surgical History  Procedure Laterality Date  . Appendectomy      as a child  . Video assisted thoracoscopy (vats) w/talc pleuadesis Right 01/14/2013    Procedure: VIDEO ASSISTED THORACOSCOPY (VATS) W/TALC PLEUADESIS;  Surgeon: Ivin Poot, MD;  Location: [place];  Service: Thoracic;  Laterality: Right;  . Chest tube insertion Right 01/14/2013    Procedure: INSERTION PLEURAL DRAINAGE CATHETER;  Surgeon: Ivin Poot, MD;  Location: [place];  Service: Thoracic;  Laterality: Right;  . Removal of pleural drainage catheter N/A 04/22/2013    Procedure: MINOR REMOVAL OF PLEURAL DRAINAGE CATHETER;  Surgeon: Ivin Poot, MD;  Location: [facility] OR;  Service: Thoracic;  Laterality: N/A;    Family History  Problem Relation Age of Onset  . ALS Mother     Deceased 25  . Other Father     Deceased from [facility], hit by drunk driver.  Hx of prostate issues    Social History History  Substance Use Topics  . Smoking status: Former Smoker -- 2.00 packs/day for 36 years    Types: Cigarettes  . Smokeless tobacco: Never Used     Comment: no smoking since 12/24/12 states had smoked since age 24  . Alcohol Use: Yes     Comment: occasionally    Current Outpatient Prescriptions  Medication Sig Dispense Refill  . acetaminophen (TYLENOL ARTHRITIS PAIN) 650 MG CR tablet Take 1,300 mg by mouth every 8 (eight) hours as needed for pain.      Marland Kitchen albuterol (PROVENTIL HFA;VENTOLIN HFA) 108 (90 BASE) MCG/ACT inhaler Inhale 2 puffs into the lungs every 4 (four) hours as needed for wheezing or shortness of breath.  1 Inhaler  1  . Aspirin-Salicylamide-Caffeine (ARTHRITIS STRENGTH BC POWDER PO) Take 1 packet by mouth.      . budesonide-formoterol (SYMBICORT) 160-4.5 MCG/ACT inhaler Inhale 2 puffs into the lungs 2 (two) times daily.  1 Inhaler  1  . docusate sodium (COLACE) 100 MG capsule Take 1 capsule (100 mg  total) by mouth every 12 (twelve) hours.  30 capsule  0  . HYDROcodone-acetaminophen (NORCO/VICODIN) 5-325 MG per tablet Take 1-2 tablets by mouth every 6 (six) hours as needed.  15 tablet  0  . naproxen sodium (ANAPROX) 220 MG tablet Take 220 mg by mouth 2 (two) times daily as needed.      . ondansetron (ZOFRAN) 8 MG tablet Take 1 tablet (8 mg total) by mouth every 8 (eight) hours as needed for nausea.  20 tablet  2  . polyethylene glycol powder (GLYCOLAX/MIRALAX) powder Take 17 g by mouth 2 (two) times daily. Until daily soft stools  OTC  255 g  0   No current facility-administered medications for this visit.    No Known Allergies  Review of Systems Patient appears somewhat discouraged today from his ongoing abdominal pain which is limiting his daily activities. He states he took some MiraLAX as provided in a deep provided with some bowel movements. He has been constipated over the past several weeks.  BP 126/92  Pulse 100  Resp 20  Ht 6' (1.829 m)  Wt 208 lb (94.348 kg)  BMI 28.20 kg/m2  SpO2 97% Physical Exam Alert and oriented  breath sounds remained diminished at the right lung base Heart rate regular \\Abdomen  with mild tenderness, no significant guarding    Diagnostic Tests: Chest x-ray shows no change in loculated right pleural effusion, right upper lung field clear, left lung clear.   Impression: No change right malignant effusion   new-onset abdominal discomfort with melanotic stools and evidence of constipation by CT scan and history  Plan       patient is provided prescription for lactulose for a stronger laxative as needed  Patient is provided prescription for Protonix 1 daily to cover gastritis gastric erosions.  No intervention needed this time for the loculated pleural effusion. We'll continue to follow this with serial chest x-rays and he'll return in 6 weeks:

## 2013-12-13 ENCOUNTER — Ambulatory Visit (HOSPITAL_COMMUNITY)
Admission: RE | Admit: 2013-12-13 | Discharge: 2013-12-13 | Disposition: A | Discharge status: Home / Self Care | Payer: Medicaid Other | Source: Ambulatory Visit | Attending: Oncology | Admitting: Oncology

## 2013-12-13 ENCOUNTER — Encounter (HOSPITAL_COMMUNITY): Payer: Self-pay

## 2013-12-13 ENCOUNTER — Other Ambulatory Visit (HOSPITAL_BASED_OUTPATIENT_CLINIC_OR_DEPARTMENT_OTHER): Payer: Medicaid Other

## 2013-12-13 DIAGNOSIS — C3490 Malignant neoplasm of unspecified part of unspecified bronchus or lung: Secondary | ICD-10-CM | POA: Insufficient documentation

## 2013-12-13 DIAGNOSIS — J9819 Other pulmonary collapse: Secondary | ICD-10-CM | POA: Diagnosis not present

## 2013-12-13 DIAGNOSIS — N200 Calculus of kidney: Secondary | ICD-10-CM | POA: Insufficient documentation

## 2013-12-13 DIAGNOSIS — R599 Enlarged lymph nodes, unspecified: Secondary | ICD-10-CM | POA: Diagnosis not present

## 2013-12-13 DIAGNOSIS — N281 Cyst of kidney, acquired: Secondary | ICD-10-CM | POA: Insufficient documentation

## 2013-12-13 DIAGNOSIS — J438 Other emphysema: Secondary | ICD-10-CM | POA: Insufficient documentation

## 2013-12-13 DIAGNOSIS — J90 Pleural effusion, not elsewhere classified: Secondary | ICD-10-CM | POA: Insufficient documentation

## 2013-12-13 DIAGNOSIS — Z9221 Personal history of antineoplastic chemotherapy: Secondary | ICD-10-CM | POA: Insufficient documentation

## 2013-12-13 DIAGNOSIS — I700 Atherosclerosis of aorta: Secondary | ICD-10-CM | POA: Insufficient documentation

## 2013-12-13 DIAGNOSIS — J910 Malignant pleural effusion: Secondary | ICD-10-CM

## 2013-12-13 DIAGNOSIS — I10 Essential (primary) hypertension: Secondary | ICD-10-CM

## 2013-12-13 LAB — COMPREHENSIVE METABOLIC PANEL (CC13)
ALBUMIN: 3.1 g/dL — AB (ref 3.5–5.0)
ALK PHOS: 94 U/L (ref 40–150)
ALT: <6 U/L (ref 0–55)
AST: 10 U/L (ref 5–34)
Anion Gap: 9 mEq/L (ref 3–11)
BUN: 16.1 mg/dL (ref 7.0–26.0)
CO2: 26 mEq/L (ref 22–29)
CREATININE: 0.8 mg/dL (ref 0.7–1.3)
Calcium: 9.5 mg/dL (ref 8.4–10.4)
Chloride: 108 mEq/L (ref 98–109)
Glucose: 109 mg/dl (ref 70–140)
POTASSIUM: 4.1 meq/L (ref 3.5–5.1)
Sodium: 144 mEq/L (ref 136–145)
Total Bilirubin: 0.21 mg/dL (ref 0.20–1.20)
Total Protein: 8.3 g/dL (ref 6.4–8.3)

## 2013-12-13 LAB — CBC WITH DIFFERENTIAL/PLATELET
BASO%: 0.3 % (ref 0.0–2.0)
Basophils Absolute: 0 10*3/uL (ref 0.0–0.1)
EOS%: 0.7 % (ref 0.0–7.0)
Eosinophils Absolute: 0 10*3/uL (ref 0.0–0.5)
HCT: 37 % — ABNORMAL LOW (ref 38.4–49.9)
HGB: 11.3 g/dL — ABNORMAL LOW (ref 13.0–17.1)
LYMPH%: 24.2 % (ref 14.0–49.0)
MCH: 24.9 pg — AB (ref 27.2–33.4)
MCHC: 30.5 g/dL — ABNORMAL LOW (ref 32.0–36.0)
MCV: 81.8 fL (ref 79.3–98.0)
MONO#: 0.4 10*3/uL (ref 0.1–0.9)
MONO%: 7 % (ref 0.0–14.0)
NEUT#: 4.1 10*3/uL (ref 1.5–6.5)
NEUT%: 67.8 % (ref 39.0–75.0)
PLATELETS: 267 10*3/uL (ref 140–400)
RBC: 4.53 10*6/uL (ref 4.20–5.82)
RDW: 18.5 % — AB (ref 11.0–14.6)
WBC: 6.1 10*3/uL (ref 4.0–10.3)
lymph#: 1.5 10*3/uL (ref 0.9–3.3)

## 2013-12-13 MED ORDER — IOHEXOL 300 MG/ML  SOLN
100.0000 mL | Freq: Once | INTRAMUSCULAR | Status: AC | PRN
  Administered 2013-12-13: 100 mL via INTRAVENOUS

## 2013-12-15 ENCOUNTER — Encounter: Payer: Self-pay | Admitting: Oncology

## 2013-12-15 ENCOUNTER — Ambulatory Visit (HOSPITAL_BASED_OUTPATIENT_CLINIC_OR_DEPARTMENT_OTHER): Payer: Medicaid Other | Admitting: Oncology

## 2013-12-15 ENCOUNTER — Encounter: Payer: Self-pay | Admitting: Medical Oncology

## 2013-12-15 ENCOUNTER — Telehealth: Payer: Self-pay | Admitting: Oncology

## 2013-12-15 VITALS — BP 125/85 | HR 85 | Temp 98.0°F | Resp 20 | Ht 72.0 in | Wt 206.8 lb

## 2013-12-15 DIAGNOSIS — C3490 Malignant neoplasm of unspecified part of unspecified bronchus or lung: Secondary | ICD-10-CM

## 2013-12-15 DIAGNOSIS — J449 Chronic obstructive pulmonary disease, unspecified: Secondary | ICD-10-CM

## 2013-12-15 DIAGNOSIS — I10 Essential (primary) hypertension: Secondary | ICD-10-CM

## 2013-12-15 DIAGNOSIS — J910 Malignant pleural effusion: Secondary | ICD-10-CM

## 2013-12-15 MED ORDER — HYDROCODONE-ACETAMINOPHEN 5-325 MG PO TABS: 1.0000 | ORAL_TABLET | Freq: Four times a day (QID) | ORAL | Status: DC | PRN

## 2013-12-15 NOTE — Progress Notes (Signed)
Hematology and Oncology Follow Up Visit  Geoffrey Richardson [id number] [birth year] 53 y.o. 12/15/2013 9:02 AM Philis Fendt, MDAvbuere, Tarry Kos, MD   Principle Diagnosis: 53 year old gentleman with diagnosis of malignant pleural effusion noted in May of 2014. At that time he presented with weakness, right chest pain and found to have pleural effusion. The cytology is suggesting malignant effusion possibly non-small cell cancer.  Prior Therapy: Patient is status post pleural catheter insertion that was done on 01/15/2013. Chemotherapy with Carboplatin and Taxotere on 02/17/13. He is S/P 6 cycles completed on 06/02/2013.   Current therapy: Observation and follow up.   Interim History: Geoffrey Richardson is a pleasant 53 year old gentleman  presented with malignant pleural effusion status post thoracentesis with the cytology revealing non-small cell cancer histology. He completed chemotherapy without complications. He still oxygen dependent (2-3L/min) and has improved mobility. He uses oxygen when he is active but none at rest. He still has pain around the Pleurex catheter site and controlled now with pain medications. He did develop constipation related to his pain medication and currently on laxatives for that. He is not reporting any fevers or chills. Has not reported any change in his respiratory status. He is eating better his weight has improved. He has not reported any new problems since the last visit.  He continues to improve and do more off oxygen. He was able to walk and uses the bus for transportation without any major difficulties.   Medications: I have reviewed the patient's current medications.  Current Outpatient Prescriptions  Medication Sig Dispense Refill  . acetaminophen (TYLENOL ARTHRITIS PAIN) 650 MG CR tablet Take 1,300 mg by mouth every 8 (eight) hours as needed for pain.      Marland Kitchen albuterol (PROVENTIL HFA;VENTOLIN HFA) 108 (90 BASE) MCG/ACT inhaler Inhale 2 puffs into the lungs every 4 (four)  hours as needed for wheezing or shortness of breath.  1 Inhaler  1  . Aspirin-Salicylamide-Caffeine (ARTHRITIS STRENGTH BC POWDER PO) Take 1 packet by mouth.      . budesonide-formoterol (SYMBICORT) 160-4.5 MCG/ACT inhaler Inhale 2 puffs into the lungs 2 (two) times daily.  1 Inhaler  1  . docusate sodium (COLACE) 100 MG capsule Take 1 capsule (100 mg total) by mouth every 12 (twelve) hours.  30 capsule  0  . HYDROcodone-acetaminophen (NORCO/VICODIN) 5-325 MG per tablet Take 1-2 tablets by mouth every 6 (six) hours as needed.  60 tablet  0  . naproxen sodium (ANAPROX) 220 MG tablet Take 220 mg by mouth 2 (two) times daily as needed.      . ondansetron (ZOFRAN) 8 MG tablet Take 1 tablet (8 mg total) by mouth every 8 (eight) hours as needed for nausea.  20 tablet  2  . polyethylene glycol powder (GLYCOLAX/MIRALAX) powder Take 17 g by mouth 2 (two) times daily. Until daily soft stools  OTC  255 g  0   No current facility-administered medications for this visit.     Allergies: No Known Allergies  Past Medical History, Surgical history, Social history, and Family History were reviewed and updated.  Review of Systems:  The remaining ROS negative.  Physical Exam: Blood pressure 125/85, pulse 85, temperature 98 F (36.7 C), temperature source Oral, resp. rate 20, height 6' (1.829 m), weight 206 lb 12.8 oz (93.804 kg), SpO2 98.00%. ECOG: 1 General appearance: alert and awake not in any distress. Head: Normocephalic, without obvious abnormality, atraumatic Neck: no adenopathy, no carotid bruit, no JVD, supple, symmetrical, trachea midline and thyroid not enlarged,  symmetric, no tenderness/mass/nodules Lymph nodes: Cervical, supraclavicular, and axillary nodes normal. Heart:regular rate and rhythm, S1, S2 normal, no murmur, click, rub or gallop Lung:chest clear, no wheezing, rales, normal symmetric air entry. He has decreased breath sounds on the right. Abdomen: soft, non-tender, without masses  or organomegaly EXT:no erythema, induration, or nodules Neurological: No deficits motor sensory or proprioception. No change in his gait or mentation.  Lab Results: Lab Results  Component Value Date   WBC 6.1 12/13/2013   HGB 11.3* 12/13/2013   HCT 37.0* 12/13/2013   MCV 81.8 12/13/2013   PLT 267 12/13/2013     Chemistry      Component Value Date/Time   NA 144 12/13/2013 0803   NA 139 11/29/2013 1105   K 4.1 12/13/2013 0803   K 4.6 11/29/2013 1105   CL 103 11/29/2013 1105   CL 103 01/19/2013 1008   CO2 26 12/13/2013 0803   CO2 24 11/29/2013 1105   BUN 16.1 12/13/2013 0803   BUN 16 11/29/2013 1105   CREATININE 0.8 12/13/2013 0803   CREATININE 0.68 11/29/2013 1105      Component Value Date/Time   CALCIUM 9.5 12/13/2013 0803   CALCIUM 9.2 11/29/2013 1105   ALKPHOS 94 12/13/2013 0803   ALKPHOS 102 11/29/2013 1105   AST 10 12/13/2013 0803   AST 21 11/29/2013 1105   ALT <6 12/13/2013 0803   ALT 10 11/29/2013 1105   BILITOT 0.21 12/13/2013 0803   BILITOT 0.4 11/29/2013 1105     EXAM:  CT CHEST, ABDOMEN, AND PELVIS WITH CONTRAST  TECHNIQUE:  Multidetector CT imaging of the chest, abdomen and pelvis was  performed following the standard protocol during bolus  administration of intravenous contrast.  CONTRAST: 136mL OMNIPAQUE IOHEXOL 300 MG/ML SOLN  COMPARISON: Prior examinations 11/29/2013 07/01/2013.  FINDINGS:  CT CHEST FINDINGS  Chronic right pleural fluid collection with thickened rind and  calcification has slightly decreased in volume. The resulting  atelectasis in the right middle and lower lobes is stable. The  volume loss in the right hemithorax has slightly increased.  The heart and great vessels appear stable. There are stable  prominent pretracheal and precarinal lymph nodes, each measuring 12  mm short axis on images 21 and 25. There is no progressive  mediastinal adenopathy. Within the right axilla, several prominent  lymph nodes have developed, not pathologically enlarged.  There is no left  pleural or pericardial effusion. The left lung  remains clear with moderate emphysematous changes.  CT ABDOMEN AND PELVIS FINDINGS  The liver, spleen, gallbladder, pancreas and biliary system remain  unremarkable in appearance.  There is no adrenal mass. Nonobstructing right renal calculi and a  1.6 cm cyst in the upper pole of the right kidney are stable. There  is no hydronephrosis.  The stomach, small bowel and colon demonstrate no significant  findings. The appendix is not visualized. There is stable mild  aortoiliac atherosclerosis. No pathologically enlarged abdominal  pelvic lymph nodes or peritoneal nodularity identified. There is a  small amount of free pelvic fluid of indeterminate etiology. The  bladder and prostate gland appear unremarkable. There is stable fat  within the left inguinal canal.  There are no worrisome osseous findings.  IMPRESSION:  1. Slightly decreased volume of chronic loculated right pleural  effusion with associated chronic right lung atelectasis.  2. Mildly enlarged pretracheal and precarinal lymph nodes are  stable. There are few new mildly prominent right axillary lymph  nodes which are likely reactive to the  chronic right pleural  process.  3. No definite signs of local recurrence or metastatic disease.  4. Small amount of free pelvic fluid of undetermined etiology.    Impression and Plan: 53 year old with the following issues:   1. 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 He is status post pleural catheter inserted on 01/15/2013. He is S/P systemic chemotherapy with Carboplatin and Taxotere. CT scan from 12/13/2013 was reviewed today and  showed stable disease without any clear-cut progression. The plan at this point is to continue with observation and surveillance and that he initiate systemic chemotherapy upon progression. I will repeat his CT scan in 6 months.  2. Esophageal focal narrowing . I doubt this is  malignancy. He is asymptomatic at this point. A PET/CT scan failed to show anything to suggest esophageal malignancy.  3. Nausea/vomiting: Ms. have resolved now.  4. Pleural effusion with a Pleurx catheter: His catheter have been removed without any major changes in his fluids.  5. COPD: He is on Albuterol PRN. I have refilled his Symbicort as he is completely out of this medication. On O2 2-3 L.  6. HTN. BP better today.  7. Pain control. He has pain due to the Pleurex catheter. He continues to use hydrocodone as needed.  8. constipation: He is currently on MiraLAX which have helped his symptoms.  9. Follow-up. In 3 months,   Wyatt Portela 5/7/20159:02 AM

## 2013-12-15 NOTE — Telephone Encounter (Signed)
Gave pt appt for lab and MD for August 2015

## 2014-01-11 ENCOUNTER — Other Ambulatory Visit: Payer: Self-pay | Admitting: Cardiothoracic Surgery

## 2014-01-11 ENCOUNTER — Ambulatory Visit: Payer: Medicaid Other | Admitting: Cardiothoracic Surgery

## 2014-01-11 DIAGNOSIS — C3490 Malignant neoplasm of unspecified part of unspecified bronchus or lung: Secondary | ICD-10-CM

## 2014-01-13 ENCOUNTER — Ambulatory Visit: Payer: Self-pay | Admitting: Cardiothoracic Surgery

## 2014-01-16 ENCOUNTER — Ambulatory Visit: Payer: Self-pay | Admitting: Cardiothoracic Surgery

## 2014-01-18 ENCOUNTER — Ambulatory Visit
Admission: RE | Admit: 2014-01-18 | Discharge: 2014-01-18 | Disposition: A | Discharge status: Home / Self Care | Payer: Self-pay | Source: Ambulatory Visit | Attending: Cardiothoracic Surgery | Admitting: Cardiothoracic Surgery

## 2014-01-18 DIAGNOSIS — C3490 Malignant neoplasm of unspecified part of unspecified bronchus or lung: Secondary | ICD-10-CM

## 2014-01-23 ENCOUNTER — Telehealth: Payer: Self-pay | Admitting: *Deleted

## 2014-01-23 NOTE — Telephone Encounter (Signed)
PT. HAS INTERMITTENT SEVERE AT A TEN [facility]ARP AND ACHY PAIN. AT TIMES STOMACH IS "BLOATED AND HARD". PT.HAS PROBLEMS WITH GAS AND CONSTIPATION. PT. HAS HAD BOWEL MOVEMENTS TWO TO THREE TIMES TODAY WHICH ARE "HARD BALLS". VERBAL ORDER AND READ BACK TO [employer]- PT. TO TAKE A DOUBLE DOSE OF MIRALAX TODAY AND ALSO USE A GLYCERIN SUPPOSITORY. STARTING TOMORROW PT. TO TAKE A DOUBLE DOSE OF MIRALAX TWICE A DAY FOR THREE DAYS. THEN MIRALAX 17G TWICE A DAY. NOTIFIED PT. AND HIS CAREGIVER, MORSHA FELDER OF THE ABOVE INSTRUCTIONS. MORSHA FELDER VOICES UNDERSTANDING BY TEACH BACK METHOD.

## 2014-01-25 ENCOUNTER — Encounter (HOSPITAL_COMMUNITY): Payer: Self-pay | Admitting: Emergency Medicine

## 2014-01-25 ENCOUNTER — Inpatient Hospital Stay (HOSPITAL_COMMUNITY)
Admission: EM | Admit: 2014-01-25 | Discharge: 2014-02-03 | DRG: 374 | Disposition: A | Discharge status: Home Health | Payer: Medicaid Other | Attending: Internal Medicine | Admitting: Internal Medicine

## 2014-01-25 ENCOUNTER — Telehealth: Payer: Self-pay | Admitting: Medical Oncology

## 2014-01-25 ENCOUNTER — Emergency Department (HOSPITAL_COMMUNITY): Payer: Medicaid Other

## 2014-01-25 DIAGNOSIS — J449 Chronic obstructive pulmonary disease, unspecified: Secondary | ICD-10-CM

## 2014-01-25 DIAGNOSIS — J9610 Chronic respiratory failure, unspecified whether with hypoxia or hypercapnia: Secondary | ICD-10-CM | POA: Diagnosis present

## 2014-01-25 DIAGNOSIS — E039 Hypothyroidism, unspecified: Secondary | ICD-10-CM | POA: Diagnosis present

## 2014-01-25 DIAGNOSIS — K219 Gastro-esophageal reflux disease without esophagitis: Secondary | ICD-10-CM | POA: Diagnosis present

## 2014-01-25 DIAGNOSIS — R1013 Epigastric pain: Secondary | ICD-10-CM | POA: Diagnosis present

## 2014-01-25 DIAGNOSIS — E43 Unspecified severe protein-calorie malnutrition: Secondary | ICD-10-CM

## 2014-01-25 DIAGNOSIS — J910 Malignant pleural effusion: Secondary | ICD-10-CM

## 2014-01-25 DIAGNOSIS — K567 Ileus, unspecified: Secondary | ICD-10-CM

## 2014-01-25 DIAGNOSIS — R1310 Dysphagia, unspecified: Secondary | ICD-10-CM | POA: Diagnosis present

## 2014-01-25 DIAGNOSIS — J9611 Chronic respiratory failure with hypoxia: Secondary | ICD-10-CM

## 2014-01-25 DIAGNOSIS — N390 Urinary tract infection, site not specified: Secondary | ICD-10-CM | POA: Diagnosis present

## 2014-01-25 DIAGNOSIS — Z79899 Other long term (current) drug therapy: Secondary | ICD-10-CM

## 2014-01-25 DIAGNOSIS — R0902 Hypoxemia: Secondary | ICD-10-CM | POA: Diagnosis present

## 2014-01-25 DIAGNOSIS — C786 Secondary malignant neoplasm of retroperitoneum and peritoneum: Secondary | ICD-10-CM | POA: Diagnosis present

## 2014-01-25 DIAGNOSIS — G4700 Insomnia, unspecified: Secondary | ICD-10-CM | POA: Diagnosis present

## 2014-01-25 DIAGNOSIS — R188 Other ascites: Secondary | ICD-10-CM

## 2014-01-25 DIAGNOSIS — Z9221 Personal history of antineoplastic chemotherapy: Secondary | ICD-10-CM

## 2014-01-25 DIAGNOSIS — Z87891 Personal history of nicotine dependence: Secondary | ICD-10-CM | POA: Diagnosis not present

## 2014-01-25 DIAGNOSIS — J438 Other emphysema: Secondary | ICD-10-CM

## 2014-01-25 DIAGNOSIS — K5900 Constipation, unspecified: Secondary | ICD-10-CM | POA: Diagnosis present

## 2014-01-25 DIAGNOSIS — K560 Paralytic ileus: Secondary | ICD-10-CM | POA: Diagnosis present

## 2014-01-25 DIAGNOSIS — A498 Other bacterial infections of unspecified site: Secondary | ICD-10-CM | POA: Diagnosis present

## 2014-01-25 DIAGNOSIS — R109 Unspecified abdominal pain: Secondary | ICD-10-CM

## 2014-01-25 DIAGNOSIS — I10 Essential (primary) hypertension: Secondary | ICD-10-CM | POA: Diagnosis present

## 2014-01-25 DIAGNOSIS — C3490 Malignant neoplasm of unspecified part of unspecified bronchus or lung: Secondary | ICD-10-CM | POA: Diagnosis present

## 2014-01-25 DIAGNOSIS — R180 Malignant ascites: Secondary | ICD-10-CM | POA: Diagnosis present

## 2014-01-25 DIAGNOSIS — J90 Pleural effusion, not elsewhere classified: Secondary | ICD-10-CM

## 2014-01-25 DIAGNOSIS — R634 Abnormal weight loss: Secondary | ICD-10-CM

## 2014-01-25 DIAGNOSIS — C799 Secondary malignant neoplasm of unspecified site: Secondary | ICD-10-CM

## 2014-01-25 DIAGNOSIS — Z720 Tobacco use: Secondary | ICD-10-CM

## 2014-01-25 DIAGNOSIS — Z515 Encounter for palliative care: Secondary | ICD-10-CM

## 2014-01-25 LAB — COMPREHENSIVE METABOLIC PANEL
ALBUMIN: 2.8 g/dL — AB (ref 3.5–5.2)
ALT: 5 U/L (ref 0–53)
AST: 10 U/L (ref 0–37)
AST: 10 U/L (ref 0–37)
Albumin: 3 g/dL — ABNORMAL LOW (ref 3.5–5.2)
Alkaline Phosphatase: 89 U/L (ref 39–117)
Alkaline Phosphatase: 83 U/L (ref 39–117)
BUN: 14 mg/dL (ref 6–23)
BUN: 12 mg/dL (ref 6–23)
CALCIUM: 9.1 mg/dL (ref 8.4–10.5)
CO2: 23 meq/L (ref 19–32)
CO2: 25 mEq/L (ref 19–32)
Calcium: 9.5 mg/dL (ref 8.4–10.5)
Chloride: 99 mEq/L (ref 96–112)
Chloride: 97 mEq/L (ref 96–112)
Creatinine, Ser: 0.64 mg/dL (ref 0.50–1.35)
Creatinine, Ser: 0.65 mg/dL (ref 0.50–1.35)
GFR calc Af Amer: >90 mL/min (ref >90)
GFR calc Af Amer: >90 mL/min (ref >90)
Glucose, Bld: 90 mg/dL (ref 70–99)
Glucose, Bld: 72 mg/dL (ref 70–99)
Potassium: 3.9 mEq/L (ref 3.7–5.3)
Potassium: 3.8 mEq/L (ref 3.7–5.3)
SODIUM: 141 meq/L (ref 137–147)
Sodium: 140 mEq/L (ref 137–147)
Total Bilirubin: 0.4 mg/dL (ref 0.3–1.2)
Total Bilirubin: 0.3 mg/dL (ref 0.3–1.2)
Total Protein: 9.1 g/dL — ABNORMAL HIGH (ref 6.0–8.3)
Total Protein: 8.5 g/dL — ABNORMAL HIGH (ref 6.0–8.3)

## 2014-01-25 LAB — CBC
HEMATOCRIT: 39.3 % (ref 39.0–52.0)
HEMOGLOBIN: 11.7 g/dL — AB (ref 13.0–17.0)
MCH: 24.7 pg — AB (ref 26.0–34.0)
MCHC: 29.8 g/dL — AB (ref 30.0–36.0)
MCV: 83.1 fL (ref 78.0–100.0)
Platelets: 310 10*3/uL (ref 150–400)
RBC: 4.73 MIL/uL (ref 4.22–5.81)
RDW: 16.6 % — ABNORMAL HIGH (ref 11.5–15.5)
WBC: 7.8 10*3/uL (ref 4.0–10.5)

## 2014-01-25 LAB — URINE MICROSCOPIC-ADD ON

## 2014-01-25 LAB — CBC WITH DIFFERENTIAL/PLATELET
Basophils Absolute: 0 10*3/uL (ref 0.0–0.1)
Basophils Relative: 0 % (ref 0–1)
EOS ABS: 0 10*3/uL (ref 0.0–0.7)
Eosinophils Relative: 0 % (ref 0–5)
HEMATOCRIT: 40.8 % (ref 39.0–52.0)
HEMOGLOBIN: 12.3 g/dL — AB (ref 13.0–17.0)
LYMPHS ABS: 1.7 10*3/uL (ref 0.7–4.0)
LYMPHS PCT: 23 % (ref 12–46)
MCH: 25 pg — AB (ref 26.0–34.0)
MCHC: 30.1 g/dL (ref 30.0–36.0)
MCV: 82.9 fL (ref 78.0–100.0)
MONO ABS: 0.4 10*3/uL (ref 0.1–1.0)
Monocytes Relative: 5 % (ref 3–12)
Neutro Abs: 5.5 10*3/uL (ref 1.7–7.7)
Neutrophils Relative %: 72 % (ref 43–77)
PLATELETS: 318 10*3/uL (ref 150–400)
RBC: 4.92 MIL/uL (ref 4.22–5.81)
RDW: 16.9 % — ABNORMAL HIGH (ref 11.5–15.5)
WBC: 7.6 10*3/uL (ref 4.0–10.5)

## 2014-01-25 LAB — I-STAT TROPONIN, ED: Troponin i, poc: 0 ng/mL (ref 0.00–0.08)

## 2014-01-25 LAB — URINALYSIS, ROUTINE W REFLEX MICROSCOPIC
Glucose, UA: NEGATIVE mg/dL
Ketones, ur: >80 mg/dL — AB
NITRITE: POSITIVE — AB
PROTEIN: 30 mg/dL — AB
Specific Gravity, Urine: 1.03 (ref 1.005–1.030)
UROBILINOGEN UA: 1 mg/dL (ref 0.0–1.0)
pH: 6 (ref 5.0–8.0)

## 2014-01-25 LAB — LIPASE, BLOOD: LIPASE: 10 U/L — AB (ref 11–59)

## 2014-01-25 LAB — TROPONIN I

## 2014-01-25 MED ORDER — ALBUTEROL SULFATE (2.5 MG/3ML) 0.083% IN NEBU: 2.5000 mg | INHALATION_SOLUTION | RESPIRATORY_TRACT | Status: DC | PRN

## 2014-01-25 MED ORDER — OXYCODONE HCL ER 10 MG PO T12A
20.0000 mg | EXTENDED_RELEASE_TABLET | Freq: Two times a day (BID) | ORAL | Status: DC
  Administered 2014-01-25 – 2014-01-28 (×7): 20 mg via ORAL
  Filled 2014-01-25 (×7): qty 2

## 2014-01-25 MED ORDER — ACETAMINOPHEN 325 MG PO TABS: 650.0000 mg | ORAL_TABLET | Freq: Four times a day (QID) | ORAL | Status: DC | PRN

## 2014-01-25 MED ORDER — ENOXAPARIN SODIUM 40 MG/0.4ML ~~LOC~~ SOLN
40.0000 mg | SUBCUTANEOUS | Status: DC
  Administered 2014-01-25 – 2014-02-02 (×9): 40 mg via SUBCUTANEOUS
  Filled 2014-01-25 (×10): qty 0.4

## 2014-01-25 MED ORDER — ONDANSETRON HCL 4 MG PO TABS: 4.0000 mg | ORAL_TABLET | Freq: Four times a day (QID) | ORAL | Status: DC | PRN

## 2014-01-25 MED ORDER — SODIUM CHLORIDE 0.9 % IV BOLUS (SEPSIS)
1000.0000 mL | Freq: Once | INTRAVENOUS | Status: AC
  Administered 2014-01-25: 1000 mL via INTRAVENOUS

## 2014-01-25 MED ORDER — FLEET ENEMA 7-19 GM/118ML RE ENEM: 1.0000 | ENEMA | Freq: Once | RECTAL | Status: AC | PRN

## 2014-01-25 MED ORDER — MORPHINE SULFATE 2 MG/ML IJ SOLN
2.0000 mg | INTRAMUSCULAR | Status: DC | PRN
  Administered 2014-01-25 – 2014-01-27 (×3): 2 mg via INTRAVENOUS
  Filled 2014-01-25 (×3): qty 1

## 2014-01-25 MED ORDER — OXYCODONE-ACETAMINOPHEN 5-325 MG PO TABS
1.0000 | ORAL_TABLET | Freq: Once | ORAL | Status: AC
  Administered 2014-01-25: 1 via ORAL
  Filled 2014-01-25: qty 1

## 2014-01-25 MED ORDER — BUDESONIDE-FORMOTEROL FUMARATE 160-4.5 MCG/ACT IN AERO
2.0000 | INHALATION_SPRAY | Freq: Two times a day (BID) | RESPIRATORY_TRACT | Status: DC
  Administered 2014-01-26 – 2014-02-03 (×16): 2 via RESPIRATORY_TRACT
  Filled 2014-01-25 (×2): qty 6

## 2014-01-25 MED ORDER — SENNA 8.6 MG PO TABS
2.0000 | ORAL_TABLET | Freq: Every day | ORAL | Status: DC
  Administered 2014-01-26 – 2014-02-02 (×8): 17.2 mg via ORAL
  Filled 2014-01-25 (×12): qty 2

## 2014-01-25 MED ORDER — SODIUM CHLORIDE 0.9 % IJ SOLN
3.0000 mL | INTRAMUSCULAR | Status: DC | PRN
Start: 2014-01-25 — End: 2014-02-03

## 2014-01-25 MED ORDER — CEFTRIAXONE SODIUM 1 G IJ SOLR
1.0000 g | Freq: Once | INTRAMUSCULAR | Status: AC
  Administered 2014-01-25: 1 g via INTRAVENOUS
  Filled 2014-01-25: qty 10

## 2014-01-25 MED ORDER — DEXTROSE 5 % IV SOLN
1.0000 g | INTRAVENOUS | Status: DC
  Administered 2014-01-26 – 2014-01-31 (×6): 1 g via INTRAVENOUS
  Filled 2014-01-25 (×6): qty 10

## 2014-01-25 MED ORDER — IOHEXOL 300 MG/ML  SOLN
100.0000 mL | Freq: Once | INTRAMUSCULAR | Status: AC | PRN
  Administered 2014-01-25: 100 mL via INTRAVENOUS

## 2014-01-25 MED ORDER — POLYETHYLENE GLYCOL 3350 17 G PO PACK
17.0000 g | PACK | Freq: Two times a day (BID) | ORAL | Status: DC
  Filled 2014-01-25 (×5): qty 1

## 2014-01-25 MED ORDER — ONDANSETRON HCL 4 MG/2ML IJ SOLN
4.0000 mg | Freq: Once | INTRAMUSCULAR | Status: AC
  Administered 2014-01-25: 4 mg via INTRAVENOUS
  Filled 2014-01-25: qty 2

## 2014-01-25 MED ORDER — ALUM & MAG HYDROXIDE-SIMETH 200-200-20 MG/5ML PO SUSP
30.0000 mL | Freq: Four times a day (QID) | ORAL | Status: DC | PRN
  Administered 2014-01-26: 30 mL via ORAL
  Filled 2014-01-25 (×2): qty 30

## 2014-01-25 MED ORDER — ONDANSETRON HCL 4 MG/2ML IJ SOLN
4.0000 mg | Freq: Four times a day (QID) | INTRAMUSCULAR | Status: DC | PRN
  Administered 2014-01-27 – 2014-02-01 (×3): 4 mg via INTRAVENOUS
  Filled 2014-01-25 (×4): qty 2

## 2014-01-25 MED ORDER — OXYCODONE HCL 5 MG PO TABS
5.0000 mg | ORAL_TABLET | ORAL | Status: DC | PRN
  Administered 2014-01-26 (×2): 10 mg via ORAL
  Filled 2014-01-25 (×2): qty 2

## 2014-01-25 MED ORDER — SODIUM CHLORIDE 0.9 % IJ SOLN
3.0000 mL | Freq: Two times a day (BID) | INTRAMUSCULAR | Status: DC
  Administered 2014-01-25 – 2014-02-03 (×15): 3 mL via INTRAVENOUS

## 2014-01-25 MED ORDER — MORPHINE SULFATE 2 MG/ML IJ SOLN
INTRAMUSCULAR | Status: AC
  Filled 2014-01-25: qty 1

## 2014-01-25 MED ORDER — MORPHINE SULFATE 4 MG/ML IJ SOLN
4.0000 mg | Freq: Once | INTRAMUSCULAR | Status: AC
  Administered 2014-01-25: 4 mg via INTRAVENOUS
  Filled 2014-01-25: qty 1

## 2014-01-25 MED ORDER — ACETAMINOPHEN 650 MG RE SUPP: 650.0000 mg | Freq: Four times a day (QID) | RECTAL | Status: DC | PRN

## 2014-01-25 MED ORDER — IOHEXOL 300 MG/ML  SOLN
25.0000 mL | Freq: Once | INTRAMUSCULAR | Status: AC | PRN
  Administered 2014-01-25: 25 mL via ORAL

## 2014-01-25 MED ORDER — ONDANSETRON 4 MG PO TBDP
8.0000 mg | ORAL_TABLET | Freq: Once | ORAL | Status: AC
  Administered 2014-01-25: 8 mg via ORAL
  Filled 2014-01-25: qty 2

## 2014-01-25 MED ORDER — SODIUM CHLORIDE 0.9 % IV SOLN
250.0000 mL | INTRAVENOUS | Status: DC | PRN
Start: 2014-01-25 — End: 2014-02-03

## 2014-01-25 MED ORDER — GUAIFENESIN-DM 100-10 MG/5ML PO SYRP
5.0000 mL | ORAL_SOLUTION | ORAL | Status: DC | PRN
Start: 2014-01-25 — End: 2014-02-03

## 2014-01-25 NOTE — H&P (Addendum)
PATIENT DETAILS Name: Geoffrey Richardson Age: 53 y.o. Sex: male Date of Birth: 04-Nov-1960 Admit Date: 01/25/2014 KWI:[id number] A, MD   CHIEF COMPLAINT:  Worsening abdominal pain  HPI: Geoffrey Richardson is a 53 y.o. 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 Per patient, approximately 2 months back he started having abdominal pain that has worsened over the past few weeks. Initially the abdominal pain attributed to constipation, however the abdominal pain persists inspite of the constipation being relieved with bowel regimen. He claims that the pain is mostly in his upper abdomen, 10/10 at its worst, associated with significantly decreased appetite. However no nausea, vomiting or diarrhea. There is no history of fever. He presented to the ED with these complaints, a CT scan of the abdomen showed increase in ascites and possible peritoneal metastases. He was also diagnosed with UTI. I was subsequently asked to admit this patient for further evaluation and treatment  ALLERGIES:  No Known Allergies  PAST MEDICAL HISTORY: Past Medical History  Diagnosis Date  . Tobacco abuse     Began age 67  . Pleural effusion on right   . COPD (chronic obstructive pulmonary disease)   . Hypertension     pt states it fluctuates but not on any medications  . Emphysema   . Shortness of breath     lying/sitting/exertion  . Headache(784.0)     occasionally  . GERD (gastroesophageal reflux disease)     takes Protonix daily  . Nocturia   . Hypothyroidism     but not on medication at the present time  . Insomnia   . Lung cancer 02/04/2013    PAST SURGICAL HISTORY: Past Surgical History  Procedure Laterality Date  . Appendectomy      as a child  . Video assisted thoracoscopy (vats)  w/talc pleuadesis Right 01/14/2013    Procedure: VIDEO ASSISTED THORACOSCOPY (VATS) W/TALC PLEUADESIS;  Surgeon: Ivin Poot, MD;  Location: [facility];  Service: Thoracic;  Laterality: Right;  . Chest tube insertion Right 01/14/2013    Procedure: INSERTION PLEURAL DRAINAGE CATHETER;  Surgeon: Ivin Poot, MD;  Location: [facility];  Service: Thoracic;  Laterality: Right;  . Removal of pleural drainage catheter N/A 04/22/2013    Procedure: MINOR REMOVAL OF PLEURAL DRAINAGE CATHETER;  Surgeon: Ivin Poot, MD;  Location: [facility];  Service: Thoracic;  Laterality: N/A;    MEDICATIONS AT HOME: Prior to Admission medications   Medication Sig Start Date End Date Taking? Authorizing Shahrzad Koble  albuterol (PROVENTIL HFA;VENTOLIN HFA) 108 (90 BASE) MCG/ACT inhaler Inhale 2 puffs into the lungs every 4 (four) hours as needed for wheezing or shortness of breath. 07/12/13  Yes Wyatt Portela, MD  bismuth subsalicylate (PEPTO BISMOL) 262 MG/15ML suspension Take 30 mLs by mouth every 6 (six) hours as needed (upset stomach).   Yes Historical Cederick Broadnax, MD  budesonide-formoterol (SYMBICORT) 160-4.5 MCG/ACT inhaler Inhale 1 puff into the lungs daily.   Yes Historical Eleftheria Taborn, MD  docusate sodium (COLACE) 100 MG capsule Take 1 capsule (100 mg total) by mouth every 12 (twelve) hours. 11/29/13  Yes Montine Circle, PA-C  HYDROcodone-acetaminophen (NORCO/VICODIN) 5-325 MG per tablet Take 1-2 tablets by mouth every 6 (six) hours as needed. 12/15/13  Yes Wyatt Portela, MD  naproxen sodium (ANAPROX) 220 MG tablet Take 220 mg  by mouth 2 (two) times daily as needed (pain).    Yes Historical Grafton Warzecha, MD  polyethylene glycol (MIRALAX / GLYCOLAX) packet Take 17 g by mouth 2 (two) times daily as needed for mild constipation.   Yes Historical Nnenna Meador, MD  Simethicone (GAS-X MAXIMUM STRENGTH PO) Take 1-2 tablets by mouth 4 (four) times daily as needed (stomach pains).   Yes Historical Leronda Lewers, MD    FAMILY HISTORY: Family History    Problem Relation Age of Onset  . ALS Mother     Deceased 39  . Other Father     Deceased from [facility], hit by drunk driver.  Hx of prostate issues   SOCIAL HISTORY:  reports that he has quit smoking. His smoking use included Cigarettes. He has a 72 pack-year smoking history. He has never used smokeless tobacco. He reports that he drinks alcohol. He reports that he does not use illicit drugs.  REVIEW OF SYSTEMS:  Constitutional:   No  weight loss, night sweats,  Fevers, chills, fatigue.  HEENT:    No headaches, Difficulty swallowing,Tooth/dental problems,Sore throat,  No sneezing, itching, ear ache, nasal congestion, post nasal drip,   Cardio-vascular: No chest pain,  Orthopnea, PND, swelling in lower extremities, anasarca,  dizziness, palpitations  GI:  No heartburn, indigestion,  nausea, vomiting, diarrhea, change in  bowel habits  Resp: No shortness of breath with exertion or at rest.  No excess mucus, no productive cough, No non-productive cough,  No coughing up of blood.No change in color of mucus.No wheezing.No chest wall deformity  Skin:  no rash or lesions.  GU:  no dysuria, change in color of urine, no urgency or frequency.  No flank pain.  Musculoskeletal: No joint pain or swelling.  No decreased range of motion.  No back pain.  Psych: No change in mood or affect. No depression or anxiety.  No memory loss.   PHYSICAL EXAM: Blood pressure 149/91, pulse 109, temperature 98.1 F (36.7 C), temperature source Oral, resp. rate 20, height 6' (1.829 m), weight 93.441 kg (206 lb), SpO2 99.00%.  General appearance :Awake, alert, not in any distress. Speech Clear. Not toxic Looking HEENT: Atraumatic and Normocephalic, pupils equally reactive to light and accomodation Neck: supple, no JVD. No cervical lymphadenopathy.  Chest:Good air entry bilaterally, no added sounds  CVS: S1 S2 regular, no murmurs.  Abdomen: Bowel sounds present, Tender in the upper abdomen, mildly  distended,with no gaurding, rigidity or rebound. Extremities: B/L Lower Ext shows no edema, both legs are warm to touch Neurology: Awake alert, and oriented X 3, CN II-XII intact, Non focal Skin:No Rash Wounds:N/A  LABS ON ADMISSION:   Recent Labs  01/25/14 1140  NA 141  K 3.9  CL 99  CO2 23  GLUCOSE 90  BUN 14  CREATININE 0.64  CALCIUM 9.5    Recent Labs  01/25/14 1140  AST 10  ALT 5  ALKPHOS 89  BILITOT 0.4  PROT 9.1*  ALBUMIN 3.0*    Recent Labs  01/25/14 1140  LIPASE 10*    Recent Labs  01/25/14 1140  WBC 7.6  NEUTROABS 5.5  HGB 12.3*  HCT 40.8  MCV 82.9  PLT 318    Recent Labs  01/25/14 1455  TROPONINI <0.30   No results found for this basename: DDIMER,  in the last 72 hours No components found with this basename: POCBNP,    RADIOLOGIC STUDIES ON ADMISSION: Ct Chest W Contrast  01/25/2014   CLINICAL DATA:  One month history of  mid abdominal pain with constipation ; history of stage IV lung malignancy  EXAM: CT CHEST, ABDOMEN, AND PELVIS WITH CONTRAST  TECHNIQUE: Multidetector CT imaging of the chest, abdomen and pelvis was performed following the standard protocol during bolus administration of intravenous contrast.  CONTRAST:  120mL OMNIPAQUE IOHEXOL 300 MG/ML  SOLN  COMPARISON:  CT scan of the chest of Dec 13, 2013 and CT scan of the abdomen and pelvis dated November 29, 2013.  FINDINGS: CT CHEST FINDINGS  There is stable parenchymal lung consolidation in the right lower lobe medially. There is mild shift of the mediastinum toward the left. There is no left pleural effusion. The left lung is clear and exhibits mild emphysematous change.  The cardiac chambers are normal in size. The caliber of the thoracic aorta is normal. There are stable enlarged right paratracheal and right hilar lymph nodes. The bony thorax exhibits no acute abnormalities.  CT ABDOMEN AND PELVIS FINDINGS  There is now ascites throughout the abdomen and pelvis. The liver exhibits no  focal mass or ductal dilation. The gallbladder, pancreas, spleen, adrenal glands, and kidneys exhibit no acute abnormalities. There is a stable 1.5 cm mid/upper pole cyst on the right. The caliber of the abdominal aorta is normal.  The stomach and small and large bowel exhibit no evidence of obstruction. There is increased density within the mesenteric fat in the left mid and upper abdomen which may reflect the presence of peritoneal implants. The urinary bladder exhibits a moderate impression upon its base by the prostate gland. There is no inguinal nor umbilical hernia. The lumbar spine and pelvis exhibit no acute abnormalities.  IMPRESSION: 1. There is a stable appearance of the abnormal right hemithorax with a large loculated right pleural effusion and persistent right lower lobe atelectasis. No acute change is demonstrated elsewhere. 2. The ascites has increased in volume and is now moderate in volume. There may be peritoneal metastatic disease to the left of midline in the mid and upper abdomen. 3. There is no bowel obstruction. 4. There is no acute hepatobiliary nor acute urinary tract abnormality.   Electronically Signed   By: David  Martinique   On: 01/25/2014 16:25   Ct Abdomen Pelvis W Contrast  01/25/2014   CLINICAL DATA:  One month history of mid abdominal pain with constipation ; history of stage IV lung malignancy  EXAM: CT CHEST, ABDOMEN, AND PELVIS WITH CONTRAST  TECHNIQUE: Multidetector CT imaging of the chest, abdomen and pelvis was performed following the standard protocol during bolus administration of intravenous contrast.  CONTRAST:  190mL OMNIPAQUE IOHEXOL 300 MG/ML  SOLN  COMPARISON:  CT scan of the chest of Dec 13, 2013 and CT scan of the abdomen and pelvis dated November 29, 2013.  FINDINGS: CT CHEST FINDINGS  There is stable parenchymal lung consolidation in the right lower lobe medially. There is mild shift of the mediastinum toward the left. There is no left pleural effusion. The left lung  is clear and exhibits mild emphysematous change.  The cardiac chambers are normal in size. The caliber of the thoracic aorta is normal. There are stable enlarged right paratracheal and right hilar lymph nodes. The bony thorax exhibits no acute abnormalities.  CT ABDOMEN AND PELVIS FINDINGS  There is now ascites throughout the abdomen and pelvis. The liver exhibits no focal mass or ductal dilation. The gallbladder, pancreas, spleen, adrenal glands, and kidneys exhibit no acute abnormalities. There is a stable 1.5 cm mid/upper pole cyst on the right. The caliber  of the abdominal aorta is normal.  The stomach and small and large bowel exhibit no evidence of obstruction. There is increased density within the mesenteric fat in the left mid and upper abdomen which may reflect the presence of peritoneal implants. The urinary bladder exhibits a moderate impression upon its base by the prostate gland. There is no inguinal nor umbilical hernia. The lumbar spine and pelvis exhibit no acute abnormalities.  IMPRESSION: 1. There is a stable appearance of the abnormal right hemithorax with a large loculated right pleural effusion and persistent right lower lobe atelectasis. No acute change is demonstrated elsewhere. 2. The ascites has increased in volume and is now moderate in volume. There may be peritoneal metastatic disease to the left of midline in the mid and upper abdomen. 3. There is no bowel obstruction. 4. There is no acute hepatobiliary nor acute urinary tract abnormality.   Electronically Signed   By: David  Martinique   On: 01/25/2014 16:25     EKG: Independently reviewed. Sinus Tachy  ASSESSMENT AND PLAN: Present on Admission:  .Intractable Abdominal pain with worsening ascites - Suspect secondary to peritoneal metastases, we'll ask interventional radiology to assist with diagnostic paracentesis. Send ascites fluid for cytology, complete blood count and albumin. Since no fever or leukocytosis, doubt any  infectious etiology at this point. - Patient on chronic Vicodin at home on a when necessary basis, will start OxyContin 20 mg twice a day, will use oxycodone 5-10 mg for moderate breakthrough pain, and IV morphine for severe breakthrough pain. Patient will be placed on a bowel regimen of MiraLax and Senokot. Further optimization of this pain regimen will need to be done depending on his clinical response. - Dr [name] patient's primary oncologist will evaluate in a.m.`  . Urinary tract infection  - Place on IV Rocephin, follow urine cultures. Afebrile and without leukocytosis.   . Malignant right-sided pleural effusion  - Reviewed outpatient oncology note-cytology this is a non-small cell cancer. He completed a course of chemotherapy on 06/02/13. Currently being managed of chemotherapy with just observation. He did have a Pleurx catheter that was inserted, however this was subsequently removed given the loculated effusion. CT chest done on admission, she was stable pleural effusion. - Await further oncology input.  . history of COPD  - Stable, continue with inhaler regimen. As needed nebulizers albuterol.   . Chronic respiratory failure with hypoxia - Continue oxygen-2-3 L per minute.  Further plan will depend as patient's clinical course evolves and further radiologic and laboratory data become available. Patient will be monitored closely.  Above noted plan was discussed with patient/spouse, they were in agreement.   DVT Prophylaxis: Prophylactic Lovenox   Code Status: Full Code  Total time spent for admission equals 45 minutes.  [facility] Pager [phone]  If 7PM-7AM, please contact night-coverage [email or web address] Password [email or web address] 01/25/2014, 5:55 PM  **Disclaimer: This note may have been dictated with voice recognition software. Similar sounding words can inadvertently be transcribed and this note may contain transcription errors which may not have been  corrected upon publication of note.**

## 2014-01-25 NOTE — ED Notes (Signed)
Pt reports having mid abd pain x 1 month. Has been seen at [facility] for same and told it was constipation, has been taking miralax and having increase in pain. Reports nausea, no vomiting. Last bm was today. Pt has stage 4 lung ca.

## 2014-01-25 NOTE — ED Notes (Signed)
Marissa,PA at the bedside.

## 2014-01-25 NOTE — ED Notes (Signed)
Hospitalist consult at bedside.

## 2014-01-25 NOTE — ED Provider Notes (Signed)
CSN: [id number]     Arrival date & time 01/25/14  1105 History   First MD Initiated Contact with Patient 01/25/14 1259     Chief Complaint  Patient presents with  . Abdominal Pain     (Consider location/radiation/quality/duration/timing/severity/associated sxs/prior Treatment) The history is provided by the patient. No language interpreter was used.  Geoffrey Richardson is a 53 y/o M with PMHx of COPD, HTN, lung cancer without known mets on chronic oxygen therapy, pleural effusions, GERD presenting to the ED with abdominal pain that has been ongoing since early May 2015. Patient reported that the discomfort is localized to the epigastric region described as a irritating, aching, bloating sensation that worsens after eating. Stated that the pain at times radiates to the lower portion of his abdomen, suprapubic region described as a shooting pain. Patient reported that the pain increase so much after eating that he has not eaten in a while secondary to increased pain. Stated that he has mentioned this to his oncologist, but nothing has been further evaluated. Patient denied dizziness, back pain, neck pain, neck stiffness, chills, nausea, vomiting, diarrhea, melena, hematochezia, dysuria.  PCP Dr. Warren Danes  Past Medical History  Diagnosis Date  . Tobacco abuse     Began age 63  . Pleural effusion on right   . COPD (chronic obstructive pulmonary disease)   . Hypertension     pt states it fluctuates but not on any medications  . Emphysema   . Shortness of breath     lying/sitting/exertion  . Headache(784.0)     occasionally  . GERD (gastroesophageal reflux disease)     takes Protonix daily  . Nocturia   . Hypothyroidism     but not on medication at the present time  . Insomnia   . Lung cancer 02/04/2013   Past Surgical History  Procedure Laterality Date  . Appendectomy      as a child  . Video assisted thoracoscopy (vats) w/talc pleuadesis Right 01/14/2013    Procedure: VIDEO ASSISTED  THORACOSCOPY (VATS) W/TALC PLEUADESIS;  Surgeon: Ivin Poot, MD;  Location: [place];  Service: Thoracic;  Laterality: Right;  . Chest tube insertion Right 01/14/2013    Procedure: INSERTION PLEURAL DRAINAGE CATHETER;  Surgeon: Ivin Poot, MD;  Location: [place];  Service: Thoracic;  Laterality: Right;  . Removal of pleural drainage catheter N/A 04/22/2013    Procedure: MINOR REMOVAL OF PLEURAL DRAINAGE CATHETER;  Surgeon: Ivin Poot, MD;  Location: [facility] OR;  Service: Thoracic;  Laterality: N/A;   Family History  Problem Relation Age of Onset  . ALS Mother     Deceased 54  . Other Father     Deceased from [facility], hit by drunk driver.  Hx of prostate issues   History  Substance Use Topics  . Smoking status: Former Smoker -- 2.00 packs/day for 36 years    Types: Cigarettes  . Smokeless tobacco: Never Used     Comment: no smoking since 12/24/12 states had smoked since age 36  . Alcohol Use: Yes     Comment: occasionally    Review of Systems  Constitutional: Positive for fever (subjective), chills and fatigue.  Respiratory: Positive for shortness of breath. Negative for chest tightness.   Cardiovascular: Positive for chest pain.  Gastrointestinal: Positive for nausea, abdominal pain and diarrhea. Negative for vomiting, constipation, blood in stool and anal bleeding.  Genitourinary: Negative for dysuria, urgency, hematuria and decreased urine volume.  Musculoskeletal: Negative for back pain and neck  pain.  Neurological: Negative for weakness.      Allergies  Review of patient's allergies indicates no known allergies.  Home Medications   Prior to Admission medications   Medication Sig Start Date End Date Taking? Authorizing Provider  albuterol (PROVENTIL HFA;VENTOLIN HFA) 108 (90 BASE) MCG/ACT inhaler Inhale 2 puffs into the lungs every 4 (four) hours as needed for wheezing or shortness of breath. 07/12/13  Yes Wyatt Portela, MD  bismuth subsalicylate (PEPTO BISMOL) 262  MG/15ML suspension Take 30 mLs by mouth every 6 (six) hours as needed (upset stomach).   Yes Historical Provider, MD  budesonide-formoterol (SYMBICORT) 160-4.5 MCG/ACT inhaler Inhale 1 puff into the lungs daily.   Yes Historical Provider, MD  docusate sodium (COLACE) 100 MG capsule Take 1 capsule (100 mg total) by mouth every 12 (twelve) hours. 11/29/13  Yes Montine Circle, PA-C  HYDROcodone-acetaminophen (NORCO/VICODIN) 5-325 MG per tablet Take 1-2 tablets by mouth every 6 (six) hours as needed. 12/15/13  Yes Wyatt Portela, MD  naproxen sodium (ANAPROX) 220 MG tablet Take 220 mg by mouth 2 (two) times daily as needed (pain).    Yes Historical Provider, MD  polyethylene glycol (MIRALAX / GLYCOLAX) packet Take 17 g by mouth 2 (two) times daily as needed for mild constipation.   Yes Historical Provider, MD  Simethicone (GAS-X MAXIMUM STRENGTH PO) Take 1-2 tablets by mouth 4 (four) times daily as needed (stomach pains).   Yes Historical Provider, MD   BP 146/98  Pulse 108  Temp(Src) 98.1 F (36.7 C) (Oral)  Resp 17  Ht 6' (1.829 m)  Wt 206 lb (93.441 kg)  BMI 27.93 kg/m2  SpO2 100% Physical Exam  Nursing note and vitals reviewed. Constitutional: He is oriented to person, place, and time. He appears well-developed and well-nourished. No distress.  HENT:  Head: Normocephalic and atraumatic.  Mouth/Throat: Oropharynx is clear and moist. No oropharyngeal exudate.  Eyes: Conjunctivae and EOM are normal. Pupils are equal, round, and reactive to light. Right eye exhibits no discharge. Left eye exhibits no discharge.  Neck: Normal range of motion. Neck supple. No tracheal deviation present.  Cardiovascular: Normal rate, regular rhythm and normal heart sounds.  Exam reveals no friction rub.   No murmur heard. Pulmonary/Chest: Effort normal and breath sounds normal. No respiratory distress. He has no wheezes. He has no rales.  Abdominal: Bowel sounds are normal. He exhibits distension. There is  tenderness. There is no rebound and no guarding.    Mild abdominal distension noted to the epigastric region  BS mildly decreased in the upper quadrants of the abdomen  Abdomen mildly hard upon palpation - mainly to the upper quadrants of the abdomen  Discomfort upon palpation   Musculoskeletal: Normal range of motion.  Lymphadenopathy:    He has no cervical adenopathy.  Neurological: He is alert and oriented to person, place, and time. No cranial nerve deficit. He exhibits normal muscle tone. Coordination normal.  Skin: Skin is warm and dry. No rash noted. He is not diaphoretic. No erythema.    ED Course  Procedures (including critical care time)  5:09 PM This provider spoke with Dr. Sloan Leiter, [facility] - discussed case, history, labs, imaging in great detail. Patient to be admitted to [place].   Results for orders placed during the hospital encounter of 01/25/14  CBC WITH DIFFERENTIAL      Result Value Ref Range   WBC 7.6  4.0 - 10.5 K/uL   RBC 4.92  4.22 - 5.81 MIL/uL  Hemoglobin 12.3 (*) 13.0 - 17.0 g/dL   HCT 40.8  39.0 - 52.0 %   MCV 82.9  78.0 - 100.0 fL   MCH 25.0 (*) 26.0 - 34.0 pg   MCHC 30.1  30.0 - 36.0 g/dL   RDW 16.9 (*) 11.5 - 15.5 %   Platelets 318  150 - 400 K/uL   Neutrophils Relative % 72  43 - 77 %   Neutro Abs 5.5  1.7 - 7.7 K/uL   Lymphocytes Relative 23  12 - 46 %   Lymphs Abs 1.7  0.7 - 4.0 K/uL   Monocytes Relative 5  3 - 12 %   Monocytes Absolute 0.4  0.1 - 1.0 K/uL   Eosinophils Relative 0  0 - 5 %   Eosinophils Absolute 0.0  0.0 - 0.7 K/uL   Basophils Relative 0  0 - 1 %   Basophils Absolute 0.0  0.0 - 0.1 K/uL  COMPREHENSIVE METABOLIC PANEL      Result Value Ref Range   Sodium 141  137 - 147 mEq/L   Potassium 3.9  3.7 - 5.3 mEq/L   Chloride 99  96 - 112 mEq/L   CO2 23  19 - 32 mEq/L   Glucose, Bld 90  70 - 99 mg/dL   BUN 14  6 - 23 mg/dL   Creatinine, Ser 0.64  0.50 - 1.35 mg/dL   Calcium 9.5  8.4 - 10.5 mg/dL   Total Protein 9.1  (*) 6.0 - 8.3 g/dL   Albumin 3.0 (*) 3.5 - 5.2 g/dL   AST 10  0 - 37 U/L   ALT 5  0 - 53 U/L   Alkaline Phosphatase 89  39 - 117 U/L   Total Bilirubin 0.4  0.3 - 1.2 mg/dL   GFR calc non Af Amer >90  >90 mL/min   GFR calc Af Amer >90  >90 mL/min  LIPASE, BLOOD      Result Value Ref Range   Lipase 10 (*) 11 - 59 U/L  URINALYSIS, ROUTINE W REFLEX MICROSCOPIC      Result Value Ref Range   Color, Urine AMBER (*) YELLOW   APPearance CLOUDY (*) CLEAR   Specific Gravity, Urine 1.030  1.005 - 1.030   pH 6.0  5.0 - 8.0   Glucose, UA NEGATIVE  NEGATIVE mg/dL   Hgb urine dipstick LARGE (*) NEGATIVE   Bilirubin Urine SMALL (*) NEGATIVE   Ketones, ur >80 (*) NEGATIVE mg/dL   Protein, ur 30 (*) NEGATIVE mg/dL   Urobilinogen, UA 1.0  0.0 - 1.0 mg/dL   Nitrite POSITIVE (*) NEGATIVE   Leukocytes, UA MODERATE (*) NEGATIVE  URINE MICROSCOPIC-ADD ON      Result Value Ref Range   Squamous Epithelial / LPF FEW (*) RARE   WBC, UA 21-50  <3 WBC/hpf   RBC / HPF TOO NUMEROUS TO COUNT  <3 RBC/hpf   Bacteria, UA MANY (*) RARE   Urine-Other MUCOUS PRESENT    TROPONIN I      Result Value Ref Range   Troponin I <0.30  <0.30 ng/mL  I-STAT TROPOININ, ED      Result Value Ref Range   Troponin i, poc 0.00  0.00 - 0.08 ng/mL   Comment 3             Labs Review Labs Reviewed  CBC WITH DIFFERENTIAL - Abnormal; Notable for the following:    Hemoglobin 12.3 (*)    MCH 25.0 (*)  RDW 16.9 (*)    All other components within normal limits  COMPREHENSIVE METABOLIC PANEL - Abnormal; Notable for the following:    Total Protein 9.1 (*)    Albumin 3.0 (*)    All other components within normal limits  LIPASE, BLOOD - Abnormal; Notable for the following:    Lipase 10 (*)    All other components within normal limits  URINALYSIS, ROUTINE W REFLEX MICROSCOPIC - Abnormal; Notable for the following:    Color, Urine AMBER (*)    APPearance CLOUDY (*)    Hgb urine dipstick LARGE (*)    Bilirubin Urine SMALL (*)      Ketones, ur >80 (*)    Protein, ur 30 (*)    Nitrite POSITIVE (*)    Leukocytes, UA MODERATE (*)    All other components within normal limits  URINE MICROSCOPIC-ADD ON - Abnormal; Notable for the following:    Squamous Epithelial / LPF FEW (*)    Bacteria, UA MANY (*)    All other components within normal limits  URINE CULTURE  TROPONIN I  I-STAT TROPOININ, ED    Imaging Review Ct Chest W Contrast  01/25/2014   CLINICAL DATA:  One month history of mid abdominal pain with constipation ; history of stage IV lung malignancy  EXAM: CT CHEST, ABDOMEN, AND PELVIS WITH CONTRAST  TECHNIQUE: Multidetector CT imaging of the chest, abdomen and pelvis was performed following the standard protocol during bolus administration of intravenous contrast.  CONTRAST:  199mL OMNIPAQUE IOHEXOL 300 MG/ML  SOLN  COMPARISON:  CT scan of the chest of Dec 13, 2013 and CT scan of the abdomen and pelvis dated November 29, 2013.  FINDINGS: CT CHEST FINDINGS  There is stable parenchymal lung consolidation in the right lower lobe medially. There is mild shift of the mediastinum toward the left. There is no left pleural effusion. The left lung is clear and exhibits mild emphysematous change.  The cardiac chambers are normal in size. The caliber of the thoracic aorta is normal. There are stable enlarged right paratracheal and right hilar lymph nodes. The bony thorax exhibits no acute abnormalities.  CT ABDOMEN AND PELVIS FINDINGS  There is now ascites throughout the abdomen and pelvis. The liver exhibits no focal mass or ductal dilation. The gallbladder, pancreas, spleen, adrenal glands, and kidneys exhibit no acute abnormalities. There is a stable 1.5 cm mid/upper pole cyst on the right. The caliber of the abdominal aorta is normal.  The stomach and small and large bowel exhibit no evidence of obstruction. There is increased density within the mesenteric fat in the left mid and upper abdomen which may reflect the presence of  peritoneal implants. The urinary bladder exhibits a moderate impression upon its base by the prostate gland. There is no inguinal nor umbilical hernia. The lumbar spine and pelvis exhibit no acute abnormalities.  IMPRESSION: 1. There is a stable appearance of the abnormal right hemithorax with a large loculated right pleural effusion and persistent right lower lobe atelectasis. No acute change is demonstrated elsewhere. 2. The ascites has increased in volume and is now moderate in volume. There may be peritoneal metastatic disease to the left of midline in the mid and upper abdomen. 3. There is no bowel obstruction. 4. There is no acute hepatobiliary nor acute urinary tract abnormality.   Electronically Signed   By: David  Martinique   On: 01/25/2014 16:25   Ct Abdomen Pelvis W Contrast  01/25/2014   CLINICAL DATA:  One  month history of mid abdominal pain with constipation ; history of stage IV lung malignancy  EXAM: CT CHEST, ABDOMEN, AND PELVIS WITH CONTRAST  TECHNIQUE: Multidetector CT imaging of the chest, abdomen and pelvis was performed following the standard protocol during bolus administration of intravenous contrast.  CONTRAST:  189mL OMNIPAQUE IOHEXOL 300 MG/ML  SOLN  COMPARISON:  CT scan of the chest of Dec 13, 2013 and CT scan of the abdomen and pelvis dated November 29, 2013.  FINDINGS: CT CHEST FINDINGS  There is stable parenchymal lung consolidation in the right lower lobe medially. There is mild shift of the mediastinum toward the left. There is no left pleural effusion. The left lung is clear and exhibits mild emphysematous change.  The cardiac chambers are normal in size. The caliber of the thoracic aorta is normal. There are stable enlarged right paratracheal and right hilar lymph nodes. The bony thorax exhibits no acute abnormalities.  CT ABDOMEN AND PELVIS FINDINGS  There is now ascites throughout the abdomen and pelvis. The liver exhibits no focal mass or ductal dilation. The gallbladder, pancreas,  spleen, adrenal glands, and kidneys exhibit no acute abnormalities. There is a stable 1.5 cm mid/upper pole cyst on the right. The caliber of the abdominal aorta is normal.  The stomach and small and large bowel exhibit no evidence of obstruction. There is increased density within the mesenteric fat in the left mid and upper abdomen which may reflect the presence of peritoneal implants. The urinary bladder exhibits a moderate impression upon its base by the prostate gland. There is no inguinal nor umbilical hernia. The lumbar spine and pelvis exhibit no acute abnormalities.  IMPRESSION: 1. There is a stable appearance of the abnormal right hemithorax with a large loculated right pleural effusion and persistent right lower lobe atelectasis. No acute change is demonstrated elsewhere. 2. The ascites has increased in volume and is now moderate in volume. There may be peritoneal metastatic disease to the left of midline in the mid and upper abdomen. 3. There is no bowel obstruction. 4. There is no acute hepatobiliary nor acute urinary tract abnormality.   Electronically Signed   By: David  Martinique   On: 01/25/2014 16:25     EKG Interpretation   Date/Time:  Wednesday January 25 2014 14:52:42 EDT Ventricular Rate:  103 PR Interval:  153 QRS Duration: 86 QT Interval:  380 QTC Calculation: 497 R Axis:   59 Text Interpretation:  Age not entered, assumed to be  53 years old for  purpose of ECG interpretation Sinus tachycardia Probable left atrial  enlargement Borderline prolonged QT interval Confirmed by DOCHERTY  MD,  MEGAN [phone]) on 01/25/2014 4:25:41 PM      MDM   Final diagnoses:  Abdominal pain  Ascites  Metastasis   Medications  oxyCODONE-acetaminophen (PERCOCET/ROXICET) 5-325 MG per tablet 1 tablet (1 tablet Oral Given 01/25/14 1147)  ondansetron (ZOFRAN-ODT) disintegrating tablet 8 mg (8 mg Oral Given 01/25/14 1148)  sodium chloride 0.9 % bolus 1,000 mL (0 mLs Intravenous Stopped 01/25/14 1701)    iohexol (OMNIPAQUE) 300 MG/ML solution 25 mL (25 mLs Oral Contrast Given 01/25/14 1517)  cefTRIAXone (ROCEPHIN) 1 g in dextrose 5 % 50 mL IVPB (0 g Intravenous Stopped 01/25/14 1701)  iohexol (OMNIPAQUE) 300 MG/ML solution 100 mL (100 mLs Intravenous Contrast Given 01/25/14 1552)  morphine 4 MG/ML injection 4 mg (4 mg Intravenous Given 01/25/14 1628)  ondansetron (ZOFRAN) injection 4 mg (4 mg Intravenous Given 01/25/14 1628)   Filed Vitals:  01/25/14 1455 01/25/14 1500 01/25/14 1627 01/25/14 1700  BP: 132/110 143/100 149/108 146/98  Pulse:  101 104 108  Temp:   98.1 F (36.7 C)   TempSrc:   Oral   Resp: 20 20  17   Height:      Weight:      SpO2: 99% 100% 98% 100%    EKG noted sinus tachycardia with left atrial enlargement-borderline prolonged QT interval with a heart rate of 103 beats per minute.. Troponin negative elevation. CBC negative elevated white blood cell count noted-negative left shift or leukocytosis identified. CMP negative elevated liver enzymes, kidneys function well, electrolytes properly balanced. Lipase negative elevation. Urine noted large hemoglobin with positive nitrites, leukocytes many bacteria identified with white blood cell count of 21-50 with too numerous to count red blood cells as well as many bacteria. Urine culture pending. CT abdomen/pelvis and chest with contrast noted stable appearance of the abnormal of the abnormal right hemithorax with a large loculated right pleural effusion persistent right lower lobe atelectasis-no acute changes demonstrated elsewhere. Ascites has increased volumes are moderate in volume. Peritoneal metastatic disease to the left of midline in the mid and upper abdomen noted with no bowel obstruction. Patient presenting to the ED with abdominal pain and inability to keep any food or fluids down, increased weakness. New findings of possible mets to the mid and upper abdomen identified with new ascites - negative findings noted on CT abdomen and  pelvis with contrast performed on 11/29/2013. Patient given IV antibiotics secondary to UTI. Patient to be admitted to [facility] for ascites, mets of cancer, UTI, inability to keep any food or fluid down. Discussed labs and imaging in great detail with patient who understood and agreed to plan. Patient stable for transfer.  Jamse Mead, PA-C 01/25/14 1822

## 2014-01-25 NOTE — Telephone Encounter (Signed)
VMOM from patient stating "pain in stomach, needs something for pain, too intense and keeping me up at night."  Return call to patient for f/u patient states pain is in "top of stomach." Inquired to length of pain, patient states for one month. States that he is out of Vicodin and has not taken it in "about a week" and has been taking over the counter pain medication. Spoke with his caregiver Ms. Davina Poke and she states she is taking him to the ED because he is "having too much pain." Informed her will let MD know. She expressed thanks.  MD inboxed.

## 2014-01-25 NOTE — ED Notes (Signed)
Report attempted x 1

## 2014-01-25 NOTE — Progress Notes (Signed)
Pt admitted to 4N[facility] with abdominal pain/cancer.  He is alert and oriented.  C/O pain to abdominal area and is on O2@2  at home and in hospital.  No family at bedside at present.  Oriented to unit. Call light within reach. Bed alarm set and explained need for alarm.  Will continue to monitor.

## 2014-01-26 ENCOUNTER — Inpatient Hospital Stay (HOSPITAL_COMMUNITY): Payer: Medicaid Other

## 2014-01-26 DIAGNOSIS — R142 Eructation: Secondary | ICD-10-CM

## 2014-01-26 DIAGNOSIS — C3490 Malignant neoplasm of unspecified part of unspecified bronchus or lung: Secondary | ICD-10-CM

## 2014-01-26 DIAGNOSIS — R141 Gas pain: Secondary | ICD-10-CM

## 2014-01-26 DIAGNOSIS — R143 Flatulence: Secondary | ICD-10-CM

## 2014-01-26 DIAGNOSIS — R6881 Early satiety: Secondary | ICD-10-CM

## 2014-01-26 DIAGNOSIS — Z85118 Personal history of other malignant neoplasm of bronchus and lung: Secondary | ICD-10-CM

## 2014-01-26 LAB — BODY FLUID CELL COUNT WITH DIFFERENTIAL
Lymphs, Fluid: 11 %
Monocyte-Macrophage-Serous Fluid: 84 % (ref 50–90)
NEUTROPHIL FLUID: 5 % (ref 0–25)
WBC FLUID: 2043 uL — AB (ref 0–1000)

## 2014-01-26 LAB — PROTIME-INR
INR: 1.16 (ref 0.00–1.49)
Prothrombin Time: 14.6 seconds (ref 11.6–15.2)

## 2014-01-26 LAB — CBC
HCT: 37.5 % — ABNORMAL LOW (ref 39.0–52.0)
Hemoglobin: 11.1 g/dL — ABNORMAL LOW (ref 13.0–17.0)
MCH: 24.8 pg — ABNORMAL LOW (ref 26.0–34.0)
MCHC: 29.6 g/dL — ABNORMAL LOW (ref 30.0–36.0)
MCV: 83.9 fL (ref 78.0–100.0)
Platelets: 305 10*3/uL (ref 150–400)
RBC: 4.47 MIL/uL (ref 4.22–5.81)
RDW: 16.8 % — ABNORMAL HIGH (ref 11.5–15.5)
WBC: 6.6 10*3/uL (ref 4.0–10.5)

## 2014-01-26 LAB — APTT: APTT: 32 s (ref 24–37)

## 2014-01-26 LAB — ALBUMIN, FLUID (OTHER): Albumin, Fluid: 2.6 g/dL

## 2014-01-26 MED ORDER — FAMOTIDINE 20 MG PO TABS
20.0000 mg | ORAL_TABLET | Freq: Two times a day (BID) | ORAL | Status: DC
  Administered 2014-01-27 – 2014-01-29 (×5): 20 mg via ORAL
  Filled 2014-01-26 (×8): qty 1

## 2014-01-26 MED ORDER — BOOST PLUS PO LIQD
237.0000 mL | Freq: Two times a day (BID) | ORAL | Status: DC
  Administered 2014-02-01 – 2014-02-03 (×5): 237 mL via ORAL
  Filled 2014-01-26 (×3): qty 237
  Filled 2014-01-26 (×2): qty 1
  Filled 2014-01-26 (×6): qty 237
  Filled 2014-01-26 (×2): qty 1
  Filled 2014-01-26 (×2): qty 237
  Filled 2014-01-26: qty 1
  Filled 2014-01-26 (×8): qty 237

## 2014-01-26 MED ORDER — ADULT MULTIVITAMIN W/MINERALS CH
1.0000 | ORAL_TABLET | Freq: Every day | ORAL | Status: DC
  Administered 2014-01-27 – 2014-02-03 (×8): 1 via ORAL
  Filled 2014-01-26 (×9): qty 1

## 2014-01-26 MED ORDER — GI COCKTAIL ~~LOC~~
30.0000 mL | Freq: Once | ORAL | Status: AC | PRN
  Administered 2014-01-26: 30 mL via ORAL
  Filled 2014-01-26: qty 30

## 2014-01-26 MED ORDER — SIMETHICONE 80 MG PO CHEW
80.0000 mg | CHEWABLE_TABLET | Freq: Four times a day (QID) | ORAL | Status: DC
  Administered 2014-01-26 – 2014-02-01 (×18): 80 mg via ORAL
  Filled 2014-01-26 (×32): qty 1

## 2014-01-26 MED ORDER — PANTOPRAZOLE SODIUM 40 MG PO TBEC
40.0000 mg | DELAYED_RELEASE_TABLET | Freq: Two times a day (BID) | ORAL | Status: DC
  Administered 2014-01-26 – 2014-01-29 (×7): 40 mg via ORAL
  Filled 2014-01-26 (×6): qty 1

## 2014-01-26 MED ORDER — BOOST / RESOURCE BREEZE PO LIQD: 1.0000 | Freq: Two times a day (BID) | ORAL | Status: DC

## 2014-01-26 NOTE — Progress Notes (Signed)
Events noted and CT scan reviewed.  Full note to follow today. It is likely he developed relapsed disease. It will be really helpful to have a core biopsy (if possible) to send it for EGFR mutation analysis.  This will help US guide his treatment options.

## 2014-01-26 NOTE — Progress Notes (Signed)
Pt is c/o of epigastric pain that is severe. He is restless and it is hard for him to lay in bed because of pain. He had paracentesis procedure done today and 1.4L was removed from patient.  Patient says that pain has been in effect for the past 45 minutes and also palpated pt's abd and slightly right of epigastric area is hardened. Patient stated that the pain was sharp and he felt nauseous. Gave patient 4 mg IV of Zofran. Paged Baltazar Najjar, [place] via text page. Will continue to monitor.

## 2014-01-26 NOTE — Procedures (Signed)
LLQ US guided paracentesis 1.4 liter blood tinged fluid removed  Sent for labs per MD  Pt tolerated well

## 2014-01-26 NOTE — Progress Notes (Signed)
UR complete.  Zabrina Brotherton Robarge RN, MSN 

## 2014-01-26 NOTE — Progress Notes (Signed)
INITIAL NUTRITION ASSESSMENT  DOCUMENTATION CODES Per approved criteria  -Severe malnutrition in the context of chronic illness  Pt meets criteria for SEVERE MALNUTRITION in the context of CHRONIC ILLNESS as evidenced by 9% weight loss in less than 2 months and estimated energy intake <75% of estimated energy needs for > 1 month.  INTERVENTION: Provide Resource Breeze BID with meals Provide Boost Plus BID in between meals Provide Multivitamin with minerals daily  NUTRITION DIAGNOSIS: Inadequate oral intake related to abdominal pain as evidenced by 9% weight loss in less than 2 months.   Goal: Pt to meet >/= 90% of their estimated nutrition needs   Monitor:  PO intake, weight trend, labs, supplement acceptance  Reason for Assessment: Consult to Assess/Positivie Malnutrition Screening Tool  53 y.o. male  Admitting Dx: Abdominal pain  ASSESSMENT: 53 y.o. 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 Per patient, approximately 2 months back he started having abdominal pain that has worsened over the past few weeks.   Pt states that for the past 2 months he has been eating and drinking very little due to abdominal pain. He reports constant pain of stomach/chest that worsened with any PO intake. For the past 2 months he was able to tolerate an apple and 2-3 Boost supplements daily; estimated intake of [phone] calories daily. Pt states today he is able to eat better with pain under control; he ate most of his breakfast and about 75% of lunch as well as an orange [employer] which he really like and tolerated well.   Nutrition Focused Physical Exam:  Subcutaneous Fat:  Orbital Region: mild wasting Upper Arm Region: moderate wasting Thoracic and Lumbar Region: NA  Muscle:   Temple Region: mild/moderate wasting Clavicle Bone Region: moderate wasting Clavicle and Acromion Bone Region: mild wasting Scapular Bone Region: NA Dorsal Hand: mild wasting Patellar Region: moderate wasting Anterior Thigh Region: mild/moderate wasting Posterior Calf Region: wnl  Edema: none noted  Height: Ht Readings from Last 1 Encounters:  01/25/14 6' (1.829 m)    Weight: Wt Readings from Last 1 Encounters:  01/25/14 189 lb 8 oz (85.957 kg)    Ideal Body Weight: 178 lbs  % Ideal Body Weight: 106%  Wt Readings from Last 10 Encounters:  01/25/14 189 lb 8 oz (85.957 kg)  12/15/13 206 lb 12.8 oz (93.804 kg)  11/30/13 208 lb (94.348 kg)  10/18/13 208 lb 8 oz (94.575 kg)  09/13/13 203 lb 9.6 oz (92.352 kg)  08/31/13 202 lb (91.627 kg)  07/15/13 202 lb (91.627 kg)  07/12/13 202 lb 8 oz (91.853 kg)  07/11/13 199 lb (90.266 kg)  06/08/13 193 lb (87.544 kg)    Usual Body Weight: 208 lbs  % Usual Body Weight: 91%  BMI:  Body mass index is 25.7 kg/(m^2).  Estimated Nutritional Needs: Kcal: 2200-2400 Protein: >/=120 grams Fluid: 2.4 L/day  Skin: intact  Diet Order: Cardiac  EDUCATION NEEDS: -No education needs identified at this time   Intake/Output Summary (Last 24 hours) at 01/26/14 1358 Last data filed at 01/25/14 2217  Gross per 24 hour  Intake      3 ml  Output      0 ml  Net      3 ml    Last BM: PTA  Labs:   Recent Labs Lab 01/25/14 1140 01/25/14 2016  NA 141 140  K 3.9 3.8  CL 99 97  CO2 23 25  BUN 14 12  CREATININE 0.64 0.65  CALCIUM 9.5 9.1  GLUCOSE 90 72    CBG (last 3)  No results found for this basename: GLUCAP,  in the last 72 hours  Scheduled Meds: . budesonide-formoterol  2 puff Inhalation BID  . cefTRIAXone (ROCEPHIN)  IV  1 g Intravenous Q24H  . enoxaparin (LOVENOX) injection  40 mg Subcutaneous Q24H  . famotidine  20 mg Oral BID  . OxyCODONE  20 mg Oral Q12H  . pantoprazole  40 mg Oral BID  . polyethylene glycol   17 g Oral BID  . senna  2 tablet Oral QHS  . simethicone  80 mg Oral QID  . sodium chloride  3 mL Intravenous Q12H    Continuous Infusions:   Past Medical History  Diagnosis Date  . Tobacco abuse     Began age 46  . Pleural effusion on right   . COPD (chronic obstructive pulmonary disease)   . Hypertension     pt states it fluctuates but not on any medications  . Emphysema   . Shortness of breath     lying/sitting/exertion  . Headache(784.0)     occasionally  . GERD (gastroesophageal reflux disease)     takes Protonix daily  . Nocturia   . Hypothyroidism     but not on medication at the present time  . Insomnia   . Lung cancer 02/04/2013    Past Surgical History  Procedure Laterality Date  . Appendectomy      as a child  . Video assisted thoracoscopy (vats) w/talc pleuadesis Right 01/14/2013    Procedure: VIDEO ASSISTED THORACOSCOPY (VATS) W/TALC PLEUADESIS;  Surgeon: Ivin Poot, MD;  Location: [place];  Service: Thoracic;  Laterality: Right;  . Chest tube insertion Right 01/14/2013    Procedure: INSERTION PLEURAL DRAINAGE CATHETER;  Surgeon: Ivin Poot, MD;  Location: [place];  Service: Thoracic;  Laterality: Right;  . Removal of pleural drainage catheter N/A 04/22/2013    Procedure: MINOR REMOVAL OF PLEURAL DRAINAGE CATHETER;  Surgeon: Ivin Poot, MD;  Location: [place];  Service: Thoracic;  Laterality: N/A;    Pryor Ochoa RD, LDN Inpatient Clinical Dietitian Pager: [phone] After Hours Pager: [phone]

## 2014-01-26 NOTE — Progress Notes (Signed)
Patient ID: Geoffrey Richardson, male   DOB: 09/12/1960, 53 y.o.   MRN: [id number]    Request has been received for paracentesis and peritoneal biopsy.  Films have been reviewed by Dr Laurence Ferrari No peritoneal findings to biopsy But Able to move forward with paracentesis.  Will send for cytology

## 2014-01-26 NOTE — Progress Notes (Signed)
IP PROGRESS NOTE  Subjective:   Principle Diagnosis: 53 year old gentleman with diagnosis of malignant pleural effusion noted in May of 2014. At that time he presented with weakness, right chest pain and found to have pleural effusion. The cytology is suggesting malignant effusion possibly non-small cell cancer.   Prior Therapy: Patient is status post pleural catheter insertion that was done on 01/15/2013.  Chemotherapy with Carboplatin and Taxotere on 02/17/13. He is S/P 6 cycles completed on 06/02/2013.   Current therapy: Observation and follow up.   Patient was hospitalized on 01/25/2014 for abdominal pain and early satiety. CT scan chest abdomen and pelvis were obtained and showed the peritoneal caking and ascites. He underwent a paracentesis was 1.4 L trained this morning. His feeling slightly better but still complaining of abdominal distention. Is not reporting any nausea or vomiting. Does not report any shortness of breath or difficulty breathing.   Objective:  Vital signs in last 24 hours: Temp:  [97.8 F (36.6 C)-98.1 F (36.7 C)] 98 F (36.7 C) (06/18 0506) Pulse Rate:  [101-111] 101 (06/18 0506) Resp:  [17-22] 20 (06/18 0506) BP: (125-155)/(91-110) 125/98 mmHg (06/18 1005) SpO2:  [98 %-100 %] 100 % (06/18 0506) Weight:  [189 lb 8 oz (85.957 kg)] 189 lb 8 oz (85.957 kg) (06/17 2021) Weight change:     Intake/Output from previous day: 06/17 0701 - 06/18 0700 In: 3 [I.V.:3] Out: -   Mouth: mucous membranes moist, pharynx normal without lesions Resp: clear to auscultation bilaterally Cardio: regular rate and rhythm, S1, S2 normal, no murmur, click, rub or gallop GI: Slightly distended with shifting dullness. Good bowel sounds tender to touch. No rebound or guarding. Extremities: extremities normal, atraumatic, no cyanosis or edema    Lab Results:  Recent Labs  01/25/14 2016 01/26/14 0610  WBC 7.8 6.6  HGB 11.7* 11.1*  HCT 39.3 37.5*  PLT 310 305     BMET  Recent Labs  01/25/14 1140 01/25/14 2016  NA 141 140  K 3.9 3.8  CL 99 97  CO2 23 25  GLUCOSE 90 72  BUN 14 12  CREATININE 0.64 0.65  CALCIUM 9.5 9.1    Studies/Results: Ct Chest W Contrast  01/25/2014   CLINICAL DATA:  One month history of mid abdominal pain with constipation ; history of stage IV lung malignancy  EXAM: CT CHEST, ABDOMEN, AND PELVIS WITH CONTRAST  TECHNIQUE: Multidetector CT imaging of the chest, abdomen and pelvis was performed following the standard protocol during bolus administration of intravenous contrast.  CONTRAST:  163m OMNIPAQUE IOHEXOL 300 MG/ML  SOLN  COMPARISON:  CT scan of the chest of Dec 13, 2013 and CT scan of the abdomen and pelvis dated November 29, 2013.  FINDINGS: CT CHEST FINDINGS  There is stable parenchymal lung consolidation in the right lower lobe medially. There is mild shift of the mediastinum toward the left. There is no left pleural effusion. The left lung is clear and exhibits mild emphysematous change.  The cardiac chambers are normal in size. The caliber of the thoracic aorta is normal. There are stable enlarged right paratracheal and right hilar lymph nodes. The bony thorax exhibits no acute abnormalities.  CT ABDOMEN AND PELVIS FINDINGS  There is now ascites throughout the abdomen and pelvis. The liver exhibits no focal mass or ductal dilation. The gallbladder, pancreas, spleen, adrenal glands, and kidneys exhibit no acute abnormalities. There is a stable 1.5 cm mid/upper pole cyst on the right. The caliber of the abdominal aorta is  normal.  The stomach and small and large bowel exhibit no evidence of obstruction. There is increased density within the mesenteric fat in the left mid and upper abdomen which may reflect the presence of peritoneal implants. The urinary bladder exhibits a moderate impression upon its base by the prostate gland. There is no inguinal nor umbilical hernia. The lumbar spine and pelvis exhibit no acute  abnormalities.  IMPRESSION: 1. There is a stable appearance of the abnormal right hemithorax with a large loculated right pleural effusion and persistent right lower lobe atelectasis. No acute change is demonstrated elsewhere. 2. The ascites has increased in volume and is now moderate in volume. There may be peritoneal metastatic disease to the left of midline in the mid and upper abdomen. 3. There is no bowel obstruction. 4. There is no acute hepatobiliary nor acute urinary tract abnormality.   Electronically Signed   By: David  Martinique   On: 01/25/2014 16:25   Ct Abdomen Pelvis W Contrast  01/25/2014   CLINICAL DATA:  One month history of mid abdominal pain with constipation ; history of stage IV lung malignancy  EXAM: CT CHEST, ABDOMEN, AND PELVIS WITH CONTRAST  TECHNIQUE: Multidetector CT imaging of the chest, abdomen and pelvis was performed following the standard protocol during bolus administration of intravenous contrast.  CONTRAST:  187m OMNIPAQUE IOHEXOL 300 MG/ML  SOLN  COMPARISON:  CT scan of the chest of Dec 13, 2013 and CT scan of the abdomen and pelvis dated November 29, 2013.  FINDINGS: CT CHEST FINDINGS  There is stable parenchymal lung consolidation in the right lower lobe medially. There is mild shift of the mediastinum toward the left. There is no left pleural effusion. The left lung is clear and exhibits mild emphysematous change.  The cardiac chambers are normal in size. The caliber of the thoracic aorta is normal. There are stable enlarged right paratracheal and right hilar lymph nodes. The bony thorax exhibits no acute abnormalities.  CT ABDOMEN AND PELVIS FINDINGS  There is now ascites throughout the abdomen and pelvis. The liver exhibits no focal mass or ductal dilation. The gallbladder, pancreas, spleen, adrenal glands, and kidneys exhibit no acute abnormalities. There is a stable 1.5 cm mid/upper pole cyst on the right. The caliber of the abdominal aorta is normal.  The stomach and small  and large bowel exhibit no evidence of obstruction. There is increased density within the mesenteric fat in the left mid and upper abdomen which may reflect the presence of peritoneal implants. The urinary bladder exhibits a moderate impression upon its base by the prostate gland. There is no inguinal nor umbilical hernia. The lumbar spine and pelvis exhibit no acute abnormalities.  IMPRESSION: 1. There is a stable appearance of the abnormal right hemithorax with a large loculated right pleural effusion and persistent right lower lobe atelectasis. No acute change is demonstrated elsewhere. 2. The ascites has increased in volume and is now moderate in volume. There may be peritoneal metastatic disease to the left of midline in the mid and upper abdomen. 3. There is no bowel obstruction. 4. There is no acute hepatobiliary nor acute urinary tract abnormality.   Electronically Signed   By: David  JMartinique  On: 01/25/2014 16:25    Medications: I have reviewed the patient's current medications.  Assessment/Plan:  53year old gentleman with the following issues  1. Abdominal pain associated with peritoneal implants and ascites. It is very possible that he might have recurrent disease at this time. He underwent paracentesis  and his cytology is pending. He might require or a core biopsy of a peritoneal implant of possible to document not only adenocarcinoma but also to test for EGFR mutation. This will help dictate his therapy and improve his symptoms. I will await the results of the cytology upper for a lower biopsy if possible from a peritoneal implant. I agree with the current management of supportive care and pain medication.  2. Adenocarcinoma presenting with pleural effusion: No evidence of any recurrent disease in the chest at this time.  I will followup on his pathology results as they come and I will leave any recommendations regarding those at this time.   LOS: 1 day   [facility] 01/26/2014, 1:09  PM

## 2014-01-26 NOTE — Progress Notes (Deleted)
IR received request today for peritoneal biopsy. DR. Laurence Ferrari has reviewed the patient's images and recommends awaiting paracentesis cytology. He does not feel this area is amendable to biopsy.   Tsosie Billing PA-C Interventional Radiology  01/26/14  9:04 AM

## 2014-01-26 NOTE — Progress Notes (Signed)
[facility] Progress Note   Geoffrey Richardson YSA:[id number] DOB: 02-Feb-1961 DOA: 01/25/2014 PCP: Philis Fendt, MD  Brief narrative: Geoffrey Richardson is a 53 y.o. 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   He presents with upper (epigastric) abdominal pian. Apparently this started about 2 mo ago and has been worse over the past few weeks. Initially the abdominal pain attributed to constipation, however the abdominal pain persists inspite of the constipation being relieved with bowel regimen. He claims that the pain is mostly in his upper abdomen, 10/10 at its worst, associated with significantly decreased appetite. However no nausea, vomiting or diarrhea.   CT scan of the abdomen showed increase in ascites and possible peritoneal metastases.   Subjective: Pain is controlled as long as he takes the pain medication - pain control lasts about 3-4 hrs.  In regards to UTI, he has had no dysuria, hematuria or suprapubic pain. No h/o BPH symptoms.   Assessment/Plan: Principal Problem:   Abdominal pain - Epigastric pain- Gastritis? Takes Naproxen at home BID - place on PPI, Pepcid, Maalox - cont Oxycodone - doubt this pain is related to the ascites or the peritoneal mets  Active Problems:   Severe malnutrition - will order ensure    Malignant pleural effusion/  non-small cell Cancer - loculated- previously had pleurex but removed as no fluid was draining   Chronic respiratory failure with hypoxia - Due to COPD and above mentioned effusion? - chronically on 2-3 L O2    Ascites - IR to dd paracentesis and a peritoneal biopsy which was added on by Dr [name] this AM  UTI - aysymptomatic - cont Rocephin and f/u culture      Code Status: Full code Family Communication: none Disposition Plan: to be  determined  Consultants: Dr [name]  Procedures: none  Antibiotics: Anti-infectives   Start     Dose/Rate Route Frequency Ordered Stop   01/26/14 1630  cefTRIAXone (ROCEPHIN) 1 g in dextrose 5 % 50 mL IVPB     1 g 100 mL/hr over 30 Minutes Intravenous Every 24 hours 01/25/14 1848     01/25/14 1600  cefTRIAXone (ROCEPHIN) 1 g in dextrose 5 % 50 mL IVPB     1 g 100 mL/hr over 30 Minutes Intravenous  Once 01/25/14 1549 01/25/14 1701       DVT prophylaxis: Lovenox  Objective: Filed Weights   01/25/14 1135 01/25/14 2021  Weight: 93.441 kg (206 lb) 85.957 kg (189 lb 8 oz)    Vitals Filed Vitals:   01/26/14 0506 01/26/14 0955 01/26/14 1000 01/26/14 1005  BP: 142/93 135/110 141/103 125/98  Pulse: 101     Temp: 98 F (36.7 C)     TempSrc: Oral     Resp: 20     Height:      Weight:      SpO2: 100%         Intake/Output Summary (Last 24 hours) at 01/26/14 1112 Last data filed at 01/25/14 2217  Gross per 24 hour  Intake      3 ml  Output      0 ml  Net      3 ml     Exam: General: No acute respiratory distress- AAO x 3 Lungs: Clear to auscultation bilaterally without wheezes or crackles Cardiovascular: Regular rate and rhythm without murmur gallop or rub normal S1 and S2 Abdomen:  mildly tender in epigastrium, moderately distended and tympanic, soft, bowel sounds positive, no rebound Extremities: No significant cyanosis, clubbing, or edema bilateral lower extremities  Data Reviewed: Basic Metabolic Panel:  Recent Labs Lab 01/25/14 1140 01/25/14 2016  NA 141 140  K 3.9 3.8  CL 99 97  CO2 23 25  GLUCOSE 90 72  BUN 14 12  CREATININE 0.64 0.65  CALCIUM 9.5 9.1   Liver Function Tests:  Recent Labs Lab 01/25/14 1140 01/25/14 2016  AST 10 10  ALT 5 <5  ALKPHOS 89 83  BILITOT 0.4 0.3  PROT 9.1* 8.5*  ALBUMIN 3.0* 2.8*    Recent Labs Lab 01/25/14 1140  LIPASE 10*   No results found for this basename: AMMONIA,  in the last 168  hours CBC:  Recent Labs Lab 01/25/14 1140 01/25/14 2016 01/26/14 0610  WBC 7.6 7.8 6.6  NEUTROABS 5.5  --   --   HGB 12.3* 11.7* 11.1*  HCT 40.8 39.3 37.5*  MCV 82.9 83.1 83.9  PLT 318 310 305   Cardiac Enzymes:  Recent Labs Lab 01/25/14 1455  TROPONINI <0.30   BNP (last 3 results) No results found for this basename: PROBNP,  in the last 8760 hours CBG: No results found for this basename: GLUCAP,  in the last 168 hours  No results found for this or any previous visit (from the past 240 hour(s)).   Studies:  Recent x-ray studies have been reviewed in detail by the Attending Physician  Scheduled Meds:  Scheduled Meds: . budesonide-formoterol  2 puff Inhalation BID  . cefTRIAXone (ROCEPHIN)  IV  1 g Intravenous Q24H  . enoxaparin (LOVENOX) injection  40 mg Subcutaneous Q24H  . OxyCODONE  20 mg Oral Q12H  . polyethylene glycol  17 g Oral BID  . senna  2 tablet Oral QHS  . sodium chloride  3 mL Intravenous Q12H   Continuous Infusions:   Time spent on care of this patient: 35 min   [place], MD 01/26/2014, 11:12 AM  LOS: 1 day   [facility] Office  [phone] Pager - Text Page per Shea Evans   If 7PM-7AM, please contact night-coverage [email or web address]

## 2014-01-27 ENCOUNTER — Inpatient Hospital Stay (HOSPITAL_COMMUNITY): Payer: Medicaid Other

## 2014-01-27 ENCOUNTER — Encounter (HOSPITAL_COMMUNITY)
Admission: EM | Disposition: A | Discharge status: Home Health | Payer: Self-pay | Source: Home / Self Care | Attending: Internal Medicine

## 2014-01-27 ENCOUNTER — Encounter (HOSPITAL_COMMUNITY): Payer: Self-pay | Admitting: *Deleted

## 2014-01-27 ENCOUNTER — Other Ambulatory Visit: Payer: Self-pay | Admitting: Cardiothoracic Surgery

## 2014-01-27 DIAGNOSIS — J438 Other emphysema: Secondary | ICD-10-CM

## 2014-01-27 DIAGNOSIS — E41 Nutritional marasmus: Secondary | ICD-10-CM

## 2014-01-27 DIAGNOSIS — E43 Unspecified severe protein-calorie malnutrition: Secondary | ICD-10-CM | POA: Insufficient documentation

## 2014-01-27 DIAGNOSIS — R1013 Epigastric pain: Secondary | ICD-10-CM

## 2014-01-27 DIAGNOSIS — C3490 Malignant neoplasm of unspecified part of unspecified bronchus or lung: Secondary | ICD-10-CM

## 2014-01-27 HISTORY — PX: ESOPHAGOGASTRODUODENOSCOPY: SHX5428

## 2014-01-27 LAB — URINE CULTURE

## 2014-01-27 SURGERY — EGD (ESOPHAGOGASTRODUODENOSCOPY)
Anesthesia: Moderate Sedation

## 2014-01-27 MED ORDER — BUTAMBEN-TETRACAINE-BENZOCAINE 2-2-14 % EX AERO
INHALATION_SPRAY | CUTANEOUS | Status: DC | PRN
  Administered 2014-01-27: 2 via TOPICAL

## 2014-01-27 MED ORDER — METOCLOPRAMIDE HCL 5 MG/ML IJ SOLN
5.0000 mg | Freq: Three times a day (TID) | INTRAMUSCULAR | Status: DC
  Administered 2014-01-27 – 2014-01-30 (×9): 5 mg via INTRAVENOUS
  Filled 2014-01-27: qty 2
  Filled 2014-01-27: qty 1
  Filled 2014-01-27: qty 2
  Filled 2014-01-27 (×2): qty 1
  Filled 2014-01-27 (×2): qty 2
  Filled 2014-01-27 (×6): qty 1

## 2014-01-27 MED ORDER — BISACODYL 10 MG RE SUPP
10.0000 mg | Freq: Once | RECTAL | Status: DC
  Filled 2014-01-27: qty 1

## 2014-01-27 MED ORDER — OXYCODONE HCL 5 MG PO TABS
5.0000 mg | ORAL_TABLET | ORAL | Status: DC | PRN
  Administered 2014-01-29: 10 mg via ORAL
  Filled 2014-01-27 (×2): qty 2

## 2014-01-27 MED ORDER — FENTANYL CITRATE 0.05 MG/ML IJ SOLN
INTRAMUSCULAR | Status: DC | PRN
  Administered 2014-01-27 (×3): 25 ug via INTRAVENOUS

## 2014-01-27 MED ORDER — DIPHENHYDRAMINE HCL 50 MG/ML IJ SOLN
INTRAMUSCULAR | Status: AC
  Filled 2014-01-27: qty 1

## 2014-01-27 MED ORDER — OXYCODONE HCL 5 MG PO TABS
5.0000 mg | ORAL_TABLET | ORAL | Status: DC
  Administered 2014-01-27: 10 mg via ORAL
  Filled 2014-01-27: qty 2

## 2014-01-27 MED ORDER — MORPHINE SULFATE 2 MG/ML IJ SOLN: 2.0000 mg | INTRAMUSCULAR | Status: DC

## 2014-01-27 MED ORDER — FENTANYL CITRATE 0.05 MG/ML IJ SOLN
INTRAMUSCULAR | Status: AC
  Filled 2014-01-27: qty 4

## 2014-01-27 MED ORDER — GI COCKTAIL ~~LOC~~
30.0000 mL | Freq: Once | ORAL | Status: DC
  Filled 2014-01-27: qty 30

## 2014-01-27 MED ORDER — MIDAZOLAM HCL 5 MG/ML IJ SOLN
INTRAMUSCULAR | Status: AC
Start: 2014-01-27 — End: 2014-01-27
  Filled 2014-01-27: qty 2

## 2014-01-27 MED ORDER — PROMETHAZINE HCL 25 MG/ML IJ SOLN
12.5000 mg | Freq: Four times a day (QID) | INTRAMUSCULAR | Status: DC | PRN
  Administered 2014-01-27: 12.5 mg via INTRAVENOUS
  Filled 2014-01-27: qty 1

## 2014-01-27 MED ORDER — MIDAZOLAM HCL 10 MG/2ML IJ SOLN
INTRAMUSCULAR | Status: DC | PRN
  Administered 2014-01-27: 1 mg via INTRAVENOUS
  Administered 2014-01-27 (×2): 2 mg via INTRAVENOUS

## 2014-01-27 MED ORDER — SODIUM CHLORIDE 0.9 % IV SOLN
INTRAVENOUS | Status: DC
  Administered 2014-01-27: 14:00:00 via INTRAVENOUS

## 2014-01-27 NOTE — Progress Notes (Signed)
[facility] Progress Note   Geoffrey Richardson LJQ:[id number] DOB: 04-15-1961 DOA: 01/25/2014 PCP: Philis Fendt, MD  Brief narrative: Geoffrey Richardson is a 53 y.o. 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   He presents with upper (epigastric) abdominal pian. Apparently this started about 2 mo ago and has been worse over the past few weeks. Initially the abdominal pain attributed to constipation, however the abdominal pain persists inspite of the constipation being relieved with bowel regimen. He claims that the pain is mostly in his upper abdomen, 10/10 at its worst, associated with significantly decreased appetite. However no nausea, vomiting or diarrhea.   CT scan of the abdomen showed increase in ascites and possible peritoneal metastases.   Subjective: Pain was quite severe last night and IV Morphine was started. He does not feel it helps and prefers taking the percocets. He is bloating when he tried to eat. He tells me today that he stopped Protonix about 4 months ago. He states his oncologist prescribed it but not sure why. I asked him again if he is taking Naproxen at home and he states he cannot remember if he is.   Assessment/Plan: Principal Problem:   Abdominal pain - Epigastric pain- Gastritis? Takes Naproxen at home BID - place on PPI, Pepcid, Maalox - cont Oxycodone - I noted that he last received it at 5:24 PM and did not get another dose - he then received Morphine at 3 AM- suspect he needs to take it regularly every 4-6 hrs without gaps-may need to place him on Oxycontin- will decide after EGD -  xray of abdomen done last night and per radiologist, was suspicious for ileus but he has good bowel sounds on exam.   Active Problems:   Severe malnutrition - cont supplements and MVI    Malignant pleural effusion/   non-small cell Cancer - loculated- previously had pleurex but removed as no fluid was draining   Chronic respiratory failure with hypoxia - Due to COPD and above mentioned effusion? - chronically on 2-3 L O2    Ascites - IR perfomed paracentesis  - cytology negative  UTI- e coli - sensitive to Rocephin - aysymptomatic - cont Rocephin    Code Status: Full code Family Communication: none Disposition Plan: to be determined  Consultants: Dr [name]  Procedures: none  Antibiotics: Anti-infectives   Start     Dose/Rate Route Frequency Ordered Stop   01/26/14 1630  cefTRIAXone (ROCEPHIN) 1 g in dextrose 5 % 50 mL IVPB     1 g 100 mL/hr over 30 Minutes Intravenous Every 24 hours 01/25/14 1848     01/25/14 1600  cefTRIAXone (ROCEPHIN) 1 g in dextrose 5 % 50 mL IVPB     1 g 100 mL/hr over 30 Minutes Intravenous  Once 01/25/14 1549 01/25/14 1701       DVT prophylaxis: Lovenox  Objective: Filed Weights   01/25/14 1135 01/25/14 2021  Weight: 93.441 kg (206 lb) 85.957 kg (189 lb 8 oz)    Vitals Filed Vitals:   01/27/14 1500 01/27/14 1505 01/27/14 1510 01/27/14 1515  BP: 156/97 178/132 149/103 146/108  Pulse: 110 117 117 115  Temp:      TempSrc:      Resp: 19 21 13 11   Height:      Weight:      SpO2: 98% 97% 99% 100%  Intake/Output Summary (Last 24 hours) at 01/27/14 1524 Last data filed at 01/26/14 2139  Gross per 24 hour  Intake      3 ml  Output      0 ml  Net      3 ml     Exam: General: No acute respiratory distress- AAO x 3 Lungs: Clear to auscultation bilaterally without wheezes or crackles Cardiovascular: Regular rate and rhythm without murmur gallop or rub normal S1 and S2 Abdomen: mildly tender in epigastrium, moderately distended and tympanic, soft, bowel sounds positive, no rebound Extremities: No significant cyanosis, clubbing, or edema bilateral lower extremities  Data Reviewed: Basic Metabolic Panel:  Recent Labs Lab  01/25/14 1140 01/25/14 2016  NA 141 140  K 3.9 3.8  CL 99 97  CO2 23 25  GLUCOSE 90 72  BUN 14 12  CREATININE 0.64 0.65  CALCIUM 9.5 9.1   Liver Function Tests:  Recent Labs Lab 01/25/14 1140 01/25/14 2016  AST 10 10  ALT 5 <5  ALKPHOS 89 83  BILITOT 0.4 0.3  PROT 9.1* 8.5*  ALBUMIN 3.0* 2.8*    Recent Labs Lab 01/25/14 1140  LIPASE 10*   No results found for this basename: AMMONIA,  in the last 168 hours CBC:  Recent Labs Lab 01/25/14 1140 01/25/14 2016 01/26/14 0610  WBC 7.6 7.8 6.6  NEUTROABS 5.5  --   --   HGB 12.3* 11.7* 11.1*  HCT 40.8 39.3 37.5*  MCV 82.9 83.1 83.9  PLT 318 310 305   Cardiac Enzymes:  Recent Labs Lab 01/25/14 1455  TROPONINI <0.30   BNP (last 3 results) No results found for this basename: PROBNP,  in the last 8760 hours CBG: No results found for this basename: GLUCAP,  in the last 168 hours  Recent Results (from the past 240 hour(s))  URINE CULTURE     Status: None   Collection Time    01/25/14  1:34 PM      Result Value Ref Range Status   Specimen Description URINE, RANDOM   Final   Special Requests ADD [id number]   Final   Culture  Setup Time     Final   Value: 01/25/2014 18:57     Performed at [facility]     Final   Value: >=100,000 COLONIES/ML     Performed at [facility]   Culture     Final   Value: ESCHERICHIA COLI     Performed at [facility]   Report Status 01/27/2014 FINAL   Final   Organism ID, Bacteria ESCHERICHIA COLI   Final     Studies:  Recent x-ray studies have been reviewed in detail by the Attending Physician  Scheduled Meds:  Scheduled Meds: . bisacodyl  10 mg Rectal Once  . budesonide-formoterol  2 puff Inhalation BID  . cefTRIAXone (ROCEPHIN)  IV  1 g Intravenous Q24H  . enoxaparin (LOVENOX) injection  40 mg Subcutaneous Q24H  . famotidine  20 mg Oral BID  . feeding supplement (RESOURCE BREEZE)  1 Container Oral BID WC  . gi cocktail  30  mL Oral Once  . lactose free nutrition  237 mL Oral BID PC  . multivitamin with minerals  1 tablet Oral Daily  . OxyCODONE  20 mg Oral Q12H  . pantoprazole  40 mg Oral BID  . senna  2 tablet Oral QHS  . simethicone  80 mg Oral QID  . sodium  chloride  3 mL Intravenous Q12H   Continuous Infusions: . sodium chloride 20 mL/hr at 01/27/14 1345    Time spent on care of this patient: 35 min   [facility], MD 01/27/2014, 3:24 PM  LOS: 2 days   [facility] Office  [phone] Pager - Text Page per Shea Evans   If 7PM-7AM, please contact night-coverage [email or web address]

## 2014-01-27 NOTE — Op Note (Signed)
[facility] [place], [address]   OPERATIVE PROCEDURE REPORT  PATIENT: Geoffrey, Richardson  MR#: [id number] BIRTHDATE: 09-Jan-1961  GENDER: Male ENDOSCOPIST: Carol Ada, MD ASSISTANT:   Mahala Menghini, technician and Carolynn Comment, technician PROCEDURE DATE: 01/27/2014 PROCEDURE:   EGD, diagnostic ASA CLASS:   Class III INDICATIONS: Epigastric abdominal pain MEDICATIONS: Versed 5 mg IV and Fentanyl 75 mcg IV TOPICAL ANESTHETIC:   Cetacaine Spray  DESCRIPTION OF PROCEDURE:   After the risks benefits and alternatives of the procedure were thoroughly explained, informed consent was obtained.  The Pentax Gastroscope [id number]  endoscope was introduced through the mouth  and advanced to the second portion of the duodenum Without limitations.      The instrument was slowly withdrawn as the mucosa was fully examined.    FINDINGS: The Z-line was sharp and there was not evidence of an esophagitis.  In the gastric lumen there was a significant amount of retained gastric contents.  It was too full of vegetable matter to suction.  The antrum/pylorus was normal as well as the duodenum. Retroflexed views revealed no abnormalities.     The scope was then withdrawn from the patient and the procedure terminated.  COMPLICATIONS: There were no complications.  IMPRESSION: 1) Retained gastric contents.  RECOMMENDATIONS: 1) Trial of Reglan.  Given his current clinical status I do not feel there is a need to perform a formal gastric emptying scan. 2) The pain may be as a result of his metastatic spread.  _______________________________ eSigned:  Carol Ada, MD 01/27/2014 3:33 PM

## 2014-01-27 NOTE — Progress Notes (Addendum)
Patient still c/o of extreme epigastric pain, states that medications is not working for him. States that he feels hot, dizzy and nauseous. He states, " that something is not right."  Gave Morphine 2 mg IV and also gave 4 mg of Zofran IV. Paged Baltazar Najjar PA via text page. Orders received. Will continue to monitor.

## 2014-01-27 NOTE — Progress Notes (Signed)
Patient returned back to unit, alert but drowsy.Will continue to monitor.

## 2014-01-27 NOTE — H&P (View-Only) (Signed)
IP PROGRESS NOTE  Subjective:   Principle Diagnosis: 53 year old gentleman with diagnosis of malignant pleural effusion noted in May of 2014. At that time he presented with weakness, right chest pain and found to have pleural effusion. The cytology is suggesting malignant effusion possibly non-small cell cancer.   Prior Therapy: Patient is status post pleural catheter insertion that was done on 01/15/2013.  Chemotherapy with Carboplatin and Taxotere on 02/17/13. He is S/P 6 cycles completed on 06/02/2013.   Current therapy: Observation and follow up.   Patient was hospitalized on 01/25/2014 for abdominal pain and early satiety. CT scan chest abdomen and pelvis were obtained and showed the peritoneal caking and ascites. He underwent a paracentesis was 1.4 L trained this morning. His feeling slightly better but still complaining of abdominal distention. Is not reporting any nausea or vomiting. Does not report any shortness of breath or difficulty breathing.   Objective:  Vital signs in last 24 hours: Temp:  [97.8 F (36.6 C)-98.1 F (36.7 C)] 98 F (36.7 C) (06/18 0506) Pulse Rate:  [101-111] 101 (06/18 0506) Resp:  [17-22] 20 (06/18 0506) BP: (125-155)/(91-110) 125/98 mmHg (06/18 1005) SpO2:  [98 %-100 %] 100 % (06/18 0506) Weight:  [189 lb 8 oz (85.957 kg)] 189 lb 8 oz (85.957 kg) (06/17 2021) Weight change:     Intake/Output from previous day: 06/17 0701 - 06/18 0700 In: 3 [I.V.:3] Out: -   Mouth: mucous membranes moist, pharynx normal without lesions Resp: clear to auscultation bilaterally Cardio: regular rate and rhythm, S1, S2 normal, no murmur, click, rub or gallop GI: Slightly distended with shifting dullness. Good bowel sounds tender to touch. No rebound or guarding. Extremities: extremities normal, atraumatic, no cyanosis or edema    Lab Results:  Recent Labs  01/25/14 2016 01/26/14 0610  WBC 7.8 6.6  HGB 11.7* 11.1*  HCT 39.3 37.5*  PLT 310 305     BMET  Recent Labs  01/25/14 1140 01/25/14 2016  NA 141 140  K 3.9 3.8  CL 99 97  CO2 23 25  GLUCOSE 90 72  BUN 14 12  CREATININE 0.64 0.65  CALCIUM 9.5 9.1    Studies/Results: Ct Chest W Contrast  01/25/2014   CLINICAL DATA:  One month history of mid abdominal pain with constipation ; history of stage IV lung malignancy  EXAM: CT CHEST, ABDOMEN, AND PELVIS WITH CONTRAST  TECHNIQUE: Multidetector CT imaging of the chest, abdomen and pelvis was performed following the standard protocol during bolus administration of intravenous contrast.  CONTRAST:  130m OMNIPAQUE IOHEXOL 300 MG/ML  SOLN  COMPARISON:  CT scan of the chest of Dec 13, 2013 and CT scan of the abdomen and pelvis dated November 29, 2013.  FINDINGS: CT CHEST FINDINGS  There is stable parenchymal lung consolidation in the right lower lobe medially. There is mild shift of the mediastinum toward the left. There is no left pleural effusion. The left lung is clear and exhibits mild emphysematous change.  The cardiac chambers are normal in size. The caliber of the thoracic aorta is normal. There are stable enlarged right paratracheal and right hilar lymph nodes. The bony thorax exhibits no acute abnormalities.  CT ABDOMEN AND PELVIS FINDINGS  There is now ascites throughout the abdomen and pelvis. The liver exhibits no focal mass or ductal dilation. The gallbladder, pancreas, spleen, adrenal glands, and kidneys exhibit no acute abnormalities. There is a stable 1.5 cm mid/upper pole cyst on the right. The caliber of the abdominal aorta is  normal.  The stomach and small and large bowel exhibit no evidence of obstruction. There is increased density within the mesenteric fat in the left mid and upper abdomen which may reflect the presence of peritoneal implants. The urinary bladder exhibits a moderate impression upon its base by the prostate gland. There is no inguinal nor umbilical hernia. The lumbar spine and pelvis exhibit no acute  abnormalities.  IMPRESSION: 1. There is a stable appearance of the abnormal right hemithorax with a large loculated right pleural effusion and persistent right lower lobe atelectasis. No acute change is demonstrated elsewhere. 2. The ascites has increased in volume and is now moderate in volume. There may be peritoneal metastatic disease to the left of midline in the mid and upper abdomen. 3. There is no bowel obstruction. 4. There is no acute hepatobiliary nor acute urinary tract abnormality.   Electronically Signed   By: David  Martinique   On: 01/25/2014 16:25   Ct Abdomen Pelvis W Contrast  01/25/2014   CLINICAL DATA:  One month history of mid abdominal pain with constipation ; history of stage IV lung malignancy  EXAM: CT CHEST, ABDOMEN, AND PELVIS WITH CONTRAST  TECHNIQUE: Multidetector CT imaging of the chest, abdomen and pelvis was performed following the standard protocol during bolus administration of intravenous contrast.  CONTRAST:  146m OMNIPAQUE IOHEXOL 300 MG/ML  SOLN  COMPARISON:  CT scan of the chest of Dec 13, 2013 and CT scan of the abdomen and pelvis dated November 29, 2013.  FINDINGS: CT CHEST FINDINGS  There is stable parenchymal lung consolidation in the right lower lobe medially. There is mild shift of the mediastinum toward the left. There is no left pleural effusion. The left lung is clear and exhibits mild emphysematous change.  The cardiac chambers are normal in size. The caliber of the thoracic aorta is normal. There are stable enlarged right paratracheal and right hilar lymph nodes. The bony thorax exhibits no acute abnormalities.  CT ABDOMEN AND PELVIS FINDINGS  There is now ascites throughout the abdomen and pelvis. The liver exhibits no focal mass or ductal dilation. The gallbladder, pancreas, spleen, adrenal glands, and kidneys exhibit no acute abnormalities. There is a stable 1.5 cm mid/upper pole cyst on the right. The caliber of the abdominal aorta is normal.  The stomach and small  and large bowel exhibit no evidence of obstruction. There is increased density within the mesenteric fat in the left mid and upper abdomen which may reflect the presence of peritoneal implants. The urinary bladder exhibits a moderate impression upon its base by the prostate gland. There is no inguinal nor umbilical hernia. The lumbar spine and pelvis exhibit no acute abnormalities.  IMPRESSION: 1. There is a stable appearance of the abnormal right hemithorax with a large loculated right pleural effusion and persistent right lower lobe atelectasis. No acute change is demonstrated elsewhere. 2. The ascites has increased in volume and is now moderate in volume. There may be peritoneal metastatic disease to the left of midline in the mid and upper abdomen. 3. There is no bowel obstruction. 4. There is no acute hepatobiliary nor acute urinary tract abnormality.   Electronically Signed   By: David  JMartinique  On: 01/25/2014 16:25    Medications: I have reviewed the patient's current medications.  Assessment/Plan:  53year old gentleman with the following issues  1. Abdominal pain associated with peritoneal implants and ascites. It is very possible that he might have recurrent disease at this time. He underwent paracentesis  and his cytology is pending. He might require or a core biopsy of a peritoneal implant of possible to document not only adenocarcinoma but also to test for EGFR mutation. This will help dictate his therapy and improve his symptoms. I will await the results of the cytology upper for a lower biopsy if possible from a peritoneal implant. I agree with the current management of supportive care and pain medication.  2. Adenocarcinoma presenting with pleural effusion: No evidence of any recurrent disease in the chest at this time.  I will followup on his pathology results as they come and I will leave any recommendations regarding those at this time.   LOS: 1 day   [facility] 01/26/2014, 1:09  PM

## 2014-01-27 NOTE — ED Provider Notes (Signed)
Medical screening examination/treatment/procedure(s) were performed by non-physician practitioner and as supervising physician I was immediately available for consultation/collaboration.   EKG Interpretation   Date/Time:  Wednesday January 25 2014 14:52:42 EDT Ventricular Rate:  103 PR Interval:  153 QRS Duration: 86 QT Interval:  380 QTC Calculation: 497 R Axis:   59 Text Interpretation:  Age not entered, assumed to be  53 years old for  purpose of ECG interpretation Sinus tachycardia Probable left atrial  enlargement Borderline prolonged QT interval Confirmed by Tawnya Crook  MD,  [place] [id number]) on 01/25/2014 4:25:41 PM        Neta Ehlers, MD 01/27/14 7639

## 2014-01-27 NOTE — Interval H&P Note (Signed)
History and Physical Interval Note:  01/27/2014 3:01 PM  Geoffrey Richardson  has presented today for surgery, with the diagnosis of Severe epigastric pain  The various methods of treatment have been discussed with the patient and family. After consideration of risks, benefits and other options for treatment, the patient has consented to  Procedure(s): ESOPHAGOGASTRODUODENOSCOPY (EGD) (N/A) as a surgical intervention .  The patient's history has been reviewed, patient examined, no change in status, stable for surgery.  I have reviewed the patient's chart and labs.  Questions were answered to the patient's satisfaction.     HUNG,PATRICK D

## 2014-01-27 NOTE — Progress Notes (Signed)
MD made aware of patient's diastolic BP

## 2014-01-27 NOTE — Consult Note (Signed)
Unassigned Consult  Reason for Consult: Epigastric pain, nausea, vomiting Referring Physician: [facility].  Geoffrey Richardson HPI: This is a 53 year old male with a PMH of non-small cell lung cancer, COPD, HTN, and GERD how is admitted with abdominal pain.  Further evaluation reveals that he has probable malignant ascites and peritoneal caking.  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  He also reports having issues with epigastric abdominal pain, nausea, and vomiting.  He states that his symptoms started a couple of months ago and his Oncologist started him on a PPI, but he discontinued the medication.  Currently he also complains of odynophagia and dysphagia.  It feels as if there is something stuck in his chest with PO intake.  As a result of his symptoms, I GI consultation was requested.  Past Medical History  Diagnosis Date  . Tobacco abuse     Began age 75  . Pleural effusion on right   . COPD (chronic obstructive pulmonary disease)   . Hypertension     pt states it fluctuates but not on any medications  . Emphysema   . Shortness of breath     lying/sitting/exertion  . Headache(784.0)     occasionally  . GERD (gastroesophageal reflux disease)     takes Protonix daily  . Nocturia   . Hypothyroidism     but not on medication at the present time  . Insomnia   . Lung cancer 02/04/2013    Past Surgical History  Procedure Laterality Date  . Appendectomy      as a child  . Video assisted thoracoscopy (vats) w/talc pleuadesis Right 01/14/2013    Procedure: VIDEO ASSISTED THORACOSCOPY (VATS) W/TALC PLEUADESIS;  Surgeon: Ivin Poot, MD;  Location: [place];  Service: Thoracic;  Laterality: Right;  . Chest tube insertion Right 01/14/2013    Procedure: INSERTION PLEURAL DRAINAGE CATHETER;  Surgeon: Ivin Poot, MD;  Location: [place];  Service: Thoracic;  Laterality: Right;  . Removal of pleural drainage catheter N/A 04/22/2013    Procedure: MINOR REMOVAL OF  PLEURAL DRAINAGE CATHETER;  Surgeon: Ivin Poot, MD;  Location: [facility];  Service: Thoracic;  Laterality: N/A;    Family History  Problem Relation Age of Onset  . ALS Mother     Deceased 56  . Other Father     Deceased from [facility], hit by drunk driver.  Hx of prostate issues    Social History:  reports that he has quit smoking. His smoking use included Cigarettes. He has a 72 pack-year smoking history. He has never used smokeless tobacco. He reports that he drinks alcohol. He reports that he does not use illicit drugs.  Allergies: No Known Allergies  Medications:  Scheduled: . bisacodyl  10 mg Rectal Once  . budesonide-formoterol  2 puff Inhalation BID  . cefTRIAXone (ROCEPHIN)  IV  1 g Intravenous Q24H  . enoxaparin (LOVENOX) injection  40 mg Subcutaneous Q24H  . famotidine  20 mg Oral BID  . feeding supplement (RESOURCE BREEZE)  1 Container Oral BID WC  . gi cocktail  30 mL Oral Once  . lactose free nutrition  237 mL Oral BID PC  . multivitamin with minerals  1 tablet Oral Daily  . OxyCODONE  20 mg Oral Q12H  . pantoprazole  40 mg Oral BID  . senna  2 tablet Oral QHS  . simethicone  80 mg Oral QID  . sodium chloride  3 mL Intravenous Q12H  Continuous: . sodium chloride 20 mL/hr at 01/27/14 1345    No results found for this or any previous visit (from the past 24 hour(s)).   Ct Chest W Contrast  01/25/2014   CLINICAL DATA:  One month history of mid abdominal pain with constipation ; history of stage IV lung malignancy  EXAM: CT CHEST, ABDOMEN, AND PELVIS WITH CONTRAST  TECHNIQUE: Multidetector CT imaging of the chest, abdomen and pelvis was performed following the standard protocol during bolus administration of intravenous contrast.  CONTRAST:  142mL OMNIPAQUE IOHEXOL 300 MG/ML  SOLN  COMPARISON:  CT scan of the chest of Dec 13, 2013 and CT scan of the abdomen and pelvis dated November 29, 2013.  FINDINGS: CT CHEST FINDINGS  There is stable parenchymal lung consolidation in  the right lower lobe medially. There is mild shift of the mediastinum toward the left. There is no left pleural effusion. The left lung is clear and exhibits mild emphysematous change.  The cardiac chambers are normal in size. The caliber of the thoracic aorta is normal. There are stable enlarged right paratracheal and right hilar lymph nodes. The bony thorax exhibits no acute abnormalities.  CT ABDOMEN AND PELVIS FINDINGS  There is now ascites throughout the abdomen and pelvis. The liver exhibits no focal mass or ductal dilation. The gallbladder, pancreas, spleen, adrenal glands, and kidneys exhibit no acute abnormalities. There is a stable 1.5 cm mid/upper pole cyst on the right. The caliber of the abdominal aorta is normal.  The stomach and small and large bowel exhibit no evidence of obstruction. There is increased density within the mesenteric fat in the left mid and upper abdomen which may reflect the presence of peritoneal implants. The urinary bladder exhibits a moderate impression upon its base by the prostate gland. There is no inguinal nor umbilical hernia. The lumbar spine and pelvis exhibit no acute abnormalities.  IMPRESSION: 1. There is a stable appearance of the abnormal right hemithorax with a large loculated right pleural effusion and persistent right lower lobe atelectasis. No acute change is demonstrated elsewhere. 2. The ascites has increased in volume and is now moderate in volume. There may be peritoneal metastatic disease to the left of midline in the mid and upper abdomen. 3. There is no bowel obstruction. 4. There is no acute hepatobiliary nor acute urinary tract abnormality.   Electronically Signed   By: David  Martinique   On: 01/25/2014 16:25   Ct Abdomen Pelvis W Contrast  01/25/2014   CLINICAL DATA:  One month history of mid abdominal pain with constipation ; history of stage IV lung malignancy  EXAM: CT CHEST, ABDOMEN, AND PELVIS WITH CONTRAST  TECHNIQUE: Multidetector CT imaging of  the chest, abdomen and pelvis was performed following the standard protocol during bolus administration of intravenous contrast.  CONTRAST:  130mL OMNIPAQUE IOHEXOL 300 MG/ML  SOLN  COMPARISON:  CT scan of the chest of Dec 13, 2013 and CT scan of the abdomen and pelvis dated November 29, 2013.  FINDINGS: CT CHEST FINDINGS  There is stable parenchymal lung consolidation in the right lower lobe medially. There is mild shift of the mediastinum toward the left. There is no left pleural effusion. The left lung is clear and exhibits mild emphysematous change.  The cardiac chambers are normal in size. The caliber of the thoracic aorta is normal. There are stable enlarged right paratracheal and right hilar lymph nodes. The bony thorax exhibits no acute abnormalities.  CT ABDOMEN AND PELVIS FINDINGS  There  is now ascites throughout the abdomen and pelvis. The liver exhibits no focal mass or ductal dilation. The gallbladder, pancreas, spleen, adrenal glands, and kidneys exhibit no acute abnormalities. There is a stable 1.5 cm mid/upper pole cyst on the right. The caliber of the abdominal aorta is normal.  The stomach and small and large bowel exhibit no evidence of obstruction. There is increased density within the mesenteric fat in the left mid and upper abdomen which may reflect the presence of peritoneal implants. The urinary bladder exhibits a moderate impression upon its base by the prostate gland. There is no inguinal nor umbilical hernia. The lumbar spine and pelvis exhibit no acute abnormalities.  IMPRESSION: 1. There is a stable appearance of the abnormal right hemithorax with a large loculated right pleural effusion and persistent right lower lobe atelectasis. No acute change is demonstrated elsewhere. 2. The ascites has increased in volume and is now moderate in volume. There may be peritoneal metastatic disease to the left of midline in the mid and upper abdomen. 3. There is no bowel obstruction. 4. There is no acute  hepatobiliary nor acute urinary tract abnormality.   Electronically Signed   By: David  Martinique   On: 01/25/2014 16:25   US Paracentesis  01/26/2014   CLINICAL DATA:  Abdominal ascites  EXAM: ULTRASOUND GUIDED left lower PARACENTESIS  COMPARISON:  None.  PROCEDURE: An ultrasound guided paracentesis was thoroughly discussed with the patient and questions answered. The benefits, risks, alternatives and complications were also discussed. The patient understands and wishes to proceed with the procedure. Written consent was obtained.  Ultrasound was performed to localize and mark an adequate pocket of fluid in the left low quadrant of the abdomen. The area was then prepped and draped in the normal sterile fashion. 1% Lidocaine was used for local anesthesia. Under ultrasound guidance a 19 gauge Yueh catheter was introduced. Paracentesis was performed. The catheter was removed and a dressing applied.  Complications: None.  FINDINGS: A total of approximately 1.4 L of blood tends fluid was removed. A fluid sample was sent for laboratory analysis.  IMPRESSION: Successful ultrasound guided paracentesis yielding 1.4 L of ascites.  Read by: [facility]   Electronically Signed   By: Jacqulynn Cadet M.D.   On: 01/26/2014 15:35   Dg Abd Portable 1v  01/27/2014   CLINICAL DATA:  Worsening abdominal pain.  Ascites.  EXAM: PORTABLE ABDOMEN - 1 VIEW  COMPARISON:  CT of the abdomen and pelvis performed 01/25/2014  FINDINGS: Status post recent paracentesis, previously noted central displacement of the bowel is no longer visualized.  There is distention of small-bowel loops to 3.7 cm in maximal diameter. However, contrast has progressed to the colon, suggesting against obstruction. This could reflect small bowel dysmotility, or if the patient's abdominal pain began very recently, partial obstruction cannot be entirely excluded.  No free intra-abdominal air is identified, though evaluation for free air is limited on this single  supine view. No acute osseous abnormalities are seen.  IMPRESSION: 1. No definite evidence of recurrent ascites, status post recent paracentesis. 2. Distention of small bowel loops to 3.7 cm in maximal diameter. However, recently administered contrast has progressed to the colon, suggesting against obstruction. This could reflect small bowel dysmotility, or if the patient's abdominal pain began very recently, partial obstruction cannot be entirely excluded. No free intra-abdominal air seen.   Electronically Signed   By: Garald Balding M.D.   On: 01/27/2014 03:54    ROS:  As stated  above in the HPI otherwise negative.  Blood pressure 164/109, pulse 114, temperature 98.5 F (36.9 C), temperature source Oral, resp. rate 18, height 6' (1.829 m), weight 189 lb 8 oz (85.957 kg), SpO2 96.00%.    PE: Gen: NAD, Alert and Oriented HEENT:  [place]/AT, EOMI Neck: Supple, no LAD Lungs: CTA Bilaterally CV: RRR without M/G/R ABM: Soft, NTND, +BS Ext: No C/C/E  Assessment/Plan: 1) Epigastric pain. 2) Odynophagia/Dysphagia. 3) Nausea/Vomiting. 4) Metastatic non-small cell lung cancer.   I will perform an EGD for further evaluation.  ? Candidal esophagitis versus peptic disease.  It can be that his pain is from his cancer as there was evidence of metastatic spread in his upper abdomen on the CT scan.  Plan: 1) EGD now.  HUNG,PATRICK D 01/27/2014, 3:03 PM

## 2014-01-28 DIAGNOSIS — J910 Malignant pleural effusion: Secondary | ICD-10-CM

## 2014-01-28 MED ORDER — BOOST / RESOURCE BREEZE PO LIQD
1.0000 | Freq: Three times a day (TID) | ORAL | Status: DC
Start: 2014-01-28 — End: 2014-02-03
  Administered 2014-01-28 – 2014-02-03 (×6): 1 via ORAL

## 2014-01-28 MED ORDER — HYDRALAZINE HCL 25 MG PO TABS
25.0000 mg | ORAL_TABLET | Freq: Three times a day (TID) | ORAL | Status: DC
  Administered 2014-01-28 – 2014-02-03 (×18): 25 mg via ORAL
  Filled 2014-01-28 (×30): qty 1

## 2014-01-28 NOTE — Progress Notes (Signed)
[facility] Progress Note   Geoffrey Richardson WNI:[id number] DOB: [birth year] DOA: 01/25/2014 PCP: Philis Fendt, MD  Brief narrative: Geoffrey Richardson is a 53 y.o. 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   He presents with upper (epigastric) abdominal pian. Apparently this started about 2 mo ago and has been worse over the past few weeks. Initially the abdominal pain attributed to constipation, however the abdominal pain persists inspite of the constipation being relieved with bowel regimen. He claims that the pain is mostly in his upper abdomen, 10/10 at its worst, associated with significantly decreased appetite. However no nausea, vomiting or diarrhea.   CT scan of the abdomen showed increase in ascites and possible peritoneal metastases.   Subjective: currently no abdominal pain- not eating much thinking it will exacerbate his pain.   Assessment/Plan: Principal Problem:   Abdominal pain - Epigastric pain- Gastritis? Takes Naproxen at home BID - placed on PPI, Pepcid, Maalox - EGD revealed food in stomach therefore mucosa was difficult to evaluate - cont Oxycodone PRN and Oxycontin Q12 -  xray of abdomen done and per radiologist, was suspicious for possible ileus but contrast from prior Ct was in colon - he has good bowel sounds on exam.   Active Problems:   Ascites- malignant - IR perfomed paracentesis  - cytology positive- further plans by oncology   Severe malnutrition - cont supplements and MVI- enjoys breeze- increased to TID    Malignant pleural effusion/  non-small cell Cancer - loculated- previously had pleurex but removed as no fluid was draining   Chronic respiratory failure with hypoxia - Due to COPD and above mentioned effusion? - chronically on 2-3 L O2  UTI- e coli - sensitive to Rocephin -  aysymptomatic - cont Rocephin - has received 3 days of Rocephin-- cont antibiotics for total of 7 days- will switch to oral once he is able to tolerate orals   Code Status: Full code Family Communication: none Disposition Plan: to be determined  Consultants: Dr [name]  Procedures: none  Antibiotics: Anti-infectives   Start     Dose/Rate Route Frequency Ordered Stop   01/26/14 1630  cefTRIAXone (ROCEPHIN) 1 g in dextrose 5 % 50 mL IVPB     1 g 100 mL/hr over 30 Minutes Intravenous Every 24 hours 01/25/14 1848     01/25/14 1600  cefTRIAXone (ROCEPHIN) 1 g in dextrose 5 % 50 mL IVPB     1 g 100 mL/hr over 30 Minutes Intravenous  Once 01/25/14 1549 01/25/14 1701       DVT prophylaxis: Lovenox  Objective: Filed Weights   01/25/14 1135 01/25/14 2021  Weight: 93.441 kg (206 lb) 85.957 kg (189 lb 8 oz)    Vitals Filed Vitals:   01/28/14 0114 01/28/14 0531 01/28/14 0919 01/28/14 1021  BP: 128/95 134/93  144/104  Pulse: 118 120  117  Temp: 98.3 F (36.8 C) 97.9 F (36.6 C)  98.3 F (36.8 C)  TempSrc: Oral Oral  Oral  Resp: 20 20  20   Height:      Weight:      SpO2: 96% 92% 92% 91%      Intake/Output Summary (Last 24 hours) at 01/28/14 1126 Last data filed at 01/27/14 1534  Gross per 24 hour  Intake    100 ml  Output      0 ml  Net    100  ml     Exam: General: No acute respiratory distress- AAO x 3 Lungs: Clear to auscultation bilaterally without wheezes or crackles Cardiovascular: Regular rate and rhythm without murmur gallop or rub normal S1 and S2 Abdomen: mildly tender in epigastrium, moderately distended and tympanic, soft, bowel sounds positive, no rebound Extremities: No significant cyanosis, clubbing, or edema bilateral lower extremities  Data Reviewed: Basic Metabolic Panel:  Recent Labs Lab 01/25/14 1140 01/25/14 2016  NA 141 140  K 3.9 3.8  CL 99 97  CO2 23 25  GLUCOSE 90 72  BUN 14 12  CREATININE 0.64 0.65  CALCIUM 9.5 9.1   Liver  Function Tests:  Recent Labs Lab 01/25/14 1140 01/25/14 2016  AST 10 10  ALT 5 <5  ALKPHOS 89 83  BILITOT 0.4 0.3  PROT 9.1* 8.5*  ALBUMIN 3.0* 2.8*    Recent Labs Lab 01/25/14 1140  LIPASE 10*   No results found for this basename: AMMONIA,  in the last 168 hours CBC:  Recent Labs Lab 01/25/14 1140 01/25/14 2016 01/26/14 0610  WBC 7.6 7.8 6.6  NEUTROABS 5.5  --   --   HGB 12.3* 11.7* 11.1*  HCT 40.8 39.3 37.5*  MCV 82.9 83.1 83.9  PLT 318 310 305   Cardiac Enzymes:  Recent Labs Lab 01/25/14 1455  TROPONINI <0.30   BNP (last 3 results) No results found for this basename: PROBNP,  in the last 8760 hours CBG: No results found for this basename: GLUCAP,  in the last 168 hours  Recent Results (from the past 240 hour(s))  URINE CULTURE     Status: None   Collection Time    01/25/14  1:34 PM      Result Value Ref Range Status   Specimen Description URINE, RANDOM   Final   Special Requests ADD [id number]   Final   Culture  Setup Time     Final   Value: 01/25/2014 18:57     Performed at [place]     Final   Value: >=100,000 COLONIES/ML     Performed at [facility]   Culture     Final   Value: ESCHERICHIA COLI     Performed at [facility]   Report Status 01/27/2014 FINAL   Final   Organism ID, Bacteria ESCHERICHIA COLI   Final     Studies:  Recent x-ray studies have been reviewed in detail by the Attending Physician  Scheduled Meds:  Scheduled Meds: . bisacodyl  10 mg Rectal Once  . budesonide-formoterol  2 puff Inhalation BID  . cefTRIAXone (ROCEPHIN)  IV  1 g Intravenous Q24H  . enoxaparin (LOVENOX) injection  40 mg Subcutaneous Q24H  . famotidine  20 mg Oral BID  . feeding supplement (RESOURCE BREEZE)  1 Container Oral TID WC  . gi cocktail  30 mL Oral Once  . lactose free nutrition  237 mL Oral BID PC  . metoCLOPramide (REGLAN) injection  5 mg Intravenous 3 times per day  . multivitamin with  minerals  1 tablet Oral Daily  . OxyCODONE  20 mg Oral Q12H  . pantoprazole  40 mg Oral BID  . senna  2 tablet Oral QHS  . simethicone  80 mg Oral QID  . sodium chloride  3 mL Intravenous Q12H   Continuous Infusions:    Time spent on care of this patient: 35 min   [place], MD 01/28/2014, 11:26 AM  LOS: 3  days   [facility] Office  [phone] Pager - Text Page per Shea Evans   If 7PM-7AM, please contact night-coverage [email or web address]

## 2014-01-28 NOTE — Progress Notes (Signed)
MD paged again to notified about diastolic BP that is still on the high side.

## 2014-01-28 NOTE — Progress Notes (Signed)
IP PROGRESS NOTE  Subjective:   Patient is still complaining of abdominal pain, slightly improved.   Objective:  Vital signs in last 24 hours: Temp:  [97.9 F (36.6 C)-98.3 F (36.8 C)] 97.9 F (36.6 C) (06/20 0531) Pulse Rate:  [110-120] 120 (06/20 0531) Resp:  [11-24] 20 (06/20 0531) BP: (121-178)/(92-132) 134/93 mmHg (06/20 0531) SpO2:  [92 %-100 %] 92 % (06/20 0919) Weight change:  Last BM Date: 01/25/14  Intake/Output from previous day: 06/19 0701 - 06/20 0700 In: 100 [I.V.:100] Out: -   Mouth: mucous membranes moist, pharynx normal without lesions Resp: clear to auscultation bilaterally Cardio: regular rate and rhythm, S1, S2 normal, no murmur, click, rub or gallop GI: Slightly distended with shifting dullness. Good bowel sounds tender to touch. No rebound or guarding. Extremities: extremities normal, atraumatic, no cyanosis or edema    Lab Results:  Recent Labs  01/25/14 2016 01/26/14 0610  WBC 7.8 6.6  HGB 11.7* 11.1*  HCT 39.3 37.5*  PLT 310 305    BMET  Recent Labs  01/25/14 1140 01/25/14 2016  NA 141 140  K 3.9 3.8  CL 99 97  CO2 23 25  GLUCOSE 90 72  BUN 14 12  CREATININE 0.64 0.65  CALCIUM 9.5 9.1      Medications: I have reviewed the patient's current medications.  Assessment/Plan:  53 year old gentleman with the following issues  1. Abdominal pain associated with peritoneal implants and ascites. Fluid cytology is positive for malignant cells. Awaiting further testing which will dictate future therapy.   2. Adenocarcinoma presenting with pleural effusion: No evidence of any recurrent disease in the chest at this time.  3. Abdominal pain: S/P endoscopy on 01/28/2014. Likely related to his cancer. Agree with pain control as you are doing.     LOS: 3 days   FKCLEX,NTZGY 01/28/2014, 9:59 AM

## 2014-01-28 NOTE — Progress Notes (Addendum)
Unassigned patient. Subjective: Patient continues to feel nauseated worse post-prandially. On full liquids at present. No BM in 2 days.  Objective: Vital signs in last 24 hours: Temp:  [97.9 F (36.6 C)-98.3 F (36.8 C)] 98.3 F (36.8 C) (06/20 1021) Pulse Rate:  [110-120] 117 (06/20 1021) Resp:  [11-24] 20 (06/20 1021) BP: (121-178)/(92-132) 144/104 mmHg (06/20 1021) SpO2:  [91 %-100 %] 91 % (06/20 1021) Last BM Date: 01/25/14  Intake/Output from previous day: 06/19 0701 - 06/20 0700 In: 100 [I.V.:100] Out: -  Intake/Output this shift:    General appearance: alert, cooperative, appears stated age, fatigued and no distress Resp: clear to auscultation bilaterally Cardio: regular rate and rhythm, S1, S2 normal, no murmur, click, rub or gallop GI: soft, distended with significant epigastric and periumbilical tenderness with gaurding; no bowel sounds; no masses,  no organomegaly Extremities: extremities normal, atraumatic, no cyanosis or edema  Lab Results:  Recent Labs  01/25/14 2016 01/26/14 0610  WBC 7.8 6.6  HGB 11.7* 11.1*  HCT 39.3 37.5*  PLT 310 305   BMET  Recent Labs  01/25/14 2016  NA 140  K 3.8  CL 97  CO2 25  GLUCOSE 72  BUN 12  CREATININE 0.65  CALCIUM 9.1   LFT  Recent Labs  01/25/14 2016  PROT 8.5*  ALBUMIN 2.8*  AST 10  ALT <5  ALKPHOS 83  BILITOT 0.3   PT/INR  Recent Labs  01/26/14 1254  LABPROT 14.6  INR 1.16   Studies/Results: Dg Abd Portable 1v  01/27/2014   CLINICAL DATA:  Worsening abdominal pain.  Ascites.  EXAM: PORTABLE ABDOMEN - 1 VIEW  COMPARISON:  CT of the abdomen and pelvis performed 01/25/2014  FINDINGS: Status post recent paracentesis, previously noted central displacement of the bowel is no longer visualized.  There is distention of small-bowel loops to 3.7 cm in maximal diameter. However, contrast has progressed to the colon, suggesting against obstruction. This could reflect small bowel dysmotility, or if the  patient's abdominal pain began very recently, partial obstruction cannot be entirely excluded.  No free intra-abdominal air is identified, though evaluation for free air is limited on this single supine view. No acute osseous abnormalities are seen.  IMPRESSION: 1. No definite evidence of recurrent ascites, status post recent paracentesis. 2. Distention of small bowel loops to 3.7 cm in maximal diameter. However, recently administered contrast has progressed to the colon, suggesting against obstruction. This could reflect small bowel dysmotility, or if the patient's abdominal pain began very recently, partial obstruction cannot be entirely excluded. No free intra-abdominal air seen.   Electronically Signed   By: Garald Balding M.D.   On: 01/27/2014 03:54   Medications: I have reviewed the patient's current medications.  Assessment/Plan: 1) Abdominal pain probably due to metastatic disease. Continue present care.  2) Constipation: Try Colace and Miralax as needed.   LOS: 3 days   MANN,JYOTHI 01/28/2014, 1:19 PM

## 2014-01-29 ENCOUNTER — Inpatient Hospital Stay (HOSPITAL_COMMUNITY): Payer: Medicaid Other

## 2014-01-29 DIAGNOSIS — K567 Ileus, unspecified: Secondary | ICD-10-CM | POA: Diagnosis present

## 2014-01-29 DIAGNOSIS — K560 Paralytic ileus: Secondary | ICD-10-CM

## 2014-01-29 MED ORDER — PANTOPRAZOLE SODIUM 40 MG IV SOLR
40.0000 mg | INTRAVENOUS | Status: DC
  Administered 2014-01-29 – 2014-01-31 (×3): 40 mg via INTRAVENOUS
  Filled 2014-01-29 (×3): qty 40

## 2014-01-29 MED ORDER — KETOROLAC TROMETHAMINE 30 MG/ML IJ SOLN
30.0000 mg | Freq: Four times a day (QID) | INTRAMUSCULAR | Status: DC | PRN
  Administered 2014-01-29 – 2014-01-30 (×3): 30 mg via INTRAVENOUS
  Filled 2014-01-29 (×3): qty 1

## 2014-01-29 MED ORDER — HYDRALAZINE HCL 20 MG/ML IJ SOLN: 10.0000 mg | Freq: Once | INTRAMUSCULAR | Status: DC

## 2014-01-29 MED ORDER — HYDROCODONE-ACETAMINOPHEN 5-325 MG PO TABS
1.0000 | ORAL_TABLET | Freq: Four times a day (QID) | ORAL | Status: DC | PRN
Start: 2014-01-29 — End: 2014-02-02
  Administered 2014-01-30 – 2014-02-02 (×10): 2 via ORAL
  Filled 2014-01-29 (×10): qty 2

## 2014-01-29 MED ORDER — FAMOTIDINE IN NACL 20-0.9 MG/50ML-% IV SOLN
20.0000 mg | Freq: Two times a day (BID) | INTRAVENOUS | Status: DC
  Administered 2014-01-29 – 2014-02-01 (×7): 20 mg via INTRAVENOUS
  Filled 2014-01-29 (×12): qty 50

## 2014-01-29 NOTE — Progress Notes (Addendum)
[facility] Progress Note   Geoffrey Richardson DPO:[id number] DOB: 23-Jul-1961 DOA: 01/25/2014 PCP: Philis Fendt, MD  Brief narrative: Geoffrey Richardson is a 53 y.o. 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   He presents with upper (epigastric) abdominal pian. Apparently this started about 2 mo ago and has been worse over the past few weeks. Initially the abdominal pain attributed to constipation, however the abdominal pain persists inspite of the constipation being relieved with bowel regimen. He claims that the pain is mostly in his upper abdomen, 10/10 at its worst, associated with significantly decreased appetite. However no nausea, vomiting or diarrhea.   CT scan of the abdomen showed increase in ascites and possible peritoneal metastases.   Subjective:  no abdominal pain- not eating much thinking it will exacerbate his pain- his abdomen is getting distended when he tries to eat or drink  Assessment/Plan: Principal Problem:   Abdominal pain - Epigastric pain- Gastritis? Takes Naproxen at home BID- stopped PPI 4 mo ago - placed on PPI, Pepcid, Maalox - EGD on 6/19 revealed food in stomach therefore mucosa was difficult to evaluate - ileus noted on Xray of abdomen today despite being on Reglan since 6/19- there is no stool noted on xray-  will need to stop POs - may be coming from increasing narcotic dosing- will stop BID Oxycontin (used for abd pain) keeping in mind that he dose not have any pain if he is not eating. - have asked him to call us if he is to develop any pain - cont Protonix and Pepcid (IV) which appear to have resulted in improvement in pain  Active Problems:   Ascites- malignant - with peritoneal mets  - IR perfomed paracentesis  - cytology positive- further plans by oncology    Severe  malnutrition -NPO for now due to ileus    Malignant pleural effusion/  non-small cell Cancer - loculated- previously had pleurex but removed as no fluid was draining   Chronic respiratory failure with hypoxia - Due to COPD and above mentioned effusion? - chronically on 2-3 L O2  UTI- e coli - sensitive to Rocephin - aysymptomatic - cont Rocephin -- cont  for total of 7 days- will switch to oral once he is able to tolerate orals   Code Status: Full code Family Communication: none- plan discussed in detail with the patient Disposition Plan: to be determined  Consultants: Dr [name]  Procedures: none  Antibiotics: Anti-infectives   Start     Dose/Rate Route Frequency Ordered Stop   01/26/14 1630  cefTRIAXone (ROCEPHIN) 1 g in dextrose 5 % 50 mL IVPB     1 g 100 mL/hr over 30 Minutes Intravenous Every 24 hours 01/25/14 1848     01/25/14 1600  cefTRIAXone (ROCEPHIN) 1 g in dextrose 5 % 50 mL IVPB     1 g 100 mL/hr over 30 Minutes Intravenous  Once 01/25/14 1549 01/25/14 1701       DVT prophylaxis: Lovenox  Objective: Filed Weights   01/25/14 1135 01/25/14 2021  Weight: 93.441 kg (206 lb) 85.957 kg (189 lb 8 oz)    Vitals Filed Vitals:   01/29/14 0535 01/29/14 0622 01/29/14 1039 01/29/14 1044  BP: 146/108 146/108  153/125  Pulse: 112   113  Temp: 98.5 F (36.9 C)   97.7 F (36.5 C)  TempSrc: Oral   Oral  Resp: 20  20  Height:      Weight:      SpO2: 94%  95% 100%      Intake/Output Summary (Last 24 hours) at 01/29/14 1120 Last data filed at 01/28/14 2129  Gross per 24 hour  Intake    243 ml  Output      0 ml  Net    243 ml     Exam: General: No acute respiratory distress- AAO x 3 Lungs: Clear to auscultation bilaterally without wheezes or crackles Cardiovascular: Regular rate and rhythm without murmur gallop or rub normal S1 and S2 Abdomen: mildly tender in epigastrium, moderately distended and tympanic, soft, bowel sounds positive, no  rebound Extremities: No significant cyanosis, clubbing, or edema bilateral lower extremities  Data Reviewed: Basic Metabolic Panel:  Recent Labs Lab 01/25/14 1140 01/25/14 2016  NA 141 140  K 3.9 3.8  CL 99 97  CO2 23 25  GLUCOSE 90 72  BUN 14 12  CREATININE 0.64 0.65  CALCIUM 9.5 9.1   Liver Function Tests:  Recent Labs Lab 01/25/14 1140 01/25/14 2016  AST 10 10  ALT 5 <5  ALKPHOS 89 83  BILITOT 0.4 0.3  PROT 9.1* 8.5*  ALBUMIN 3.0* 2.8*    Recent Labs Lab 01/25/14 1140  LIPASE 10*   No results found for this basename: AMMONIA,  in the last 168 hours CBC:  Recent Labs Lab 01/25/14 1140 01/25/14 2016 01/26/14 0610  WBC 7.6 7.8 6.6  NEUTROABS 5.5  --   --   HGB 12.3* 11.7* 11.1*  HCT 40.8 39.3 37.5*  MCV 82.9 83.1 83.9  PLT 318 310 305   Cardiac Enzymes:  Recent Labs Lab 01/25/14 1455  TROPONINI <0.30   BNP (last 3 results) No results found for this basename: PROBNP,  in the last 8760 hours CBG: No results found for this basename: GLUCAP,  in the last 168 hours  Recent Results (from the past 240 hour(s))  URINE CULTURE     Status: None   Collection Time    01/25/14  1:34 PM      Result Value Ref Range Status   Specimen Description URINE, RANDOM   Final   Special Requests ADD [id number]   Final   Culture  Setup Time     Final   Value: 01/25/2014 18:57     Performed at [place]     Final   Value: >=100,000 COLONIES/ML     Performed at [facility]   Culture     Final   Value: ESCHERICHIA COLI     Performed at [facility]   Report Status 01/27/2014 FINAL   Final   Organism ID, Bacteria ESCHERICHIA COLI   Final     Studies:  Recent x-ray studies have been reviewed in detail by the Attending Physician  Scheduled Meds:  Scheduled Meds: . bisacodyl  10 mg Rectal Once  . budesonide-formoterol  2 puff Inhalation BID  . cefTRIAXone (ROCEPHIN)  IV  1 g Intravenous Q24H  . enoxaparin  (LOVENOX) injection  40 mg Subcutaneous Q24H  . famotidine  20 mg Oral BID  . feeding supplement (RESOURCE BREEZE)  1 Container Oral TID WC  . gi cocktail  30 mL Oral Once  . hydrALAZINE  25 mg Oral 3 times per day  . lactose free nutrition  237 mL Oral BID PC  . metoCLOPramide (REGLAN) injection  5 mg Intravenous 3 times per day  .  multivitamin with minerals  1 tablet Oral Daily  . pantoprazole  40 mg Oral BID  . senna  2 tablet Oral QHS  . simethicone  80 mg Oral QID  . sodium chloride  3 mL Intravenous Q12H   Continuous Infusions:    Time spent on care of this patient: 35 min   [place], MD 01/29/2014, 11:20 AM  LOS: 4 days   [facility] Office  [phone] Pager - Text Page per Shea Evans   If 7PM-7AM, please contact night-coverage [email or web address]

## 2014-01-30 ENCOUNTER — Encounter (HOSPITAL_COMMUNITY): Payer: Self-pay | Admitting: Gastroenterology

## 2014-01-30 ENCOUNTER — Inpatient Hospital Stay (HOSPITAL_COMMUNITY): Payer: Medicaid Other

## 2014-01-30 DIAGNOSIS — R180 Malignant ascites: Secondary | ICD-10-CM

## 2014-01-30 LAB — BASIC METABOLIC PANEL
BUN: 24 mg/dL — ABNORMAL HIGH (ref 6–23)
CO2: 30 meq/L (ref 19–32)
Calcium: 9.4 mg/dL (ref 8.4–10.5)
Chloride: 99 mEq/L (ref 96–112)
Creatinine, Ser: 0.91 mg/dL (ref 0.50–1.35)
GFR calc Af Amer: >90 mL/min (ref >90)
GFR calc non Af Amer: >90 mL/min (ref >90)
Glucose, Bld: 105 mg/dL — ABNORMAL HIGH (ref 70–99)
Potassium: 4.1 mEq/L (ref 3.7–5.3)
SODIUM: 142 meq/L (ref 137–147)

## 2014-01-30 LAB — CBC
HCT: 39.9 % (ref 39.0–52.0)
HEMOGLOBIN: 12 g/dL — AB (ref 13.0–17.0)
MCH: 24.9 pg — ABNORMAL LOW (ref 26.0–34.0)
MCHC: 30.1 g/dL (ref 30.0–36.0)
MCV: 83 fL (ref 78.0–100.0)
Platelets: 324 10*3/uL (ref 150–400)
RBC: 4.81 MIL/uL (ref 4.22–5.81)
RDW: 16.9 % — ABNORMAL HIGH (ref 11.5–15.5)
WBC: 7.3 10*3/uL (ref 4.0–10.5)

## 2014-01-30 MED ORDER — KETOROLAC TROMETHAMINE 30 MG/ML IJ SOLN
15.0000 mg | Freq: Four times a day (QID) | INTRAMUSCULAR | Status: DC | PRN
  Administered 2014-01-30 – 2014-01-31 (×4): 15 mg via INTRAVENOUS
  Filled 2014-01-30 (×4): qty 1

## 2014-01-30 MED ORDER — SODIUM CHLORIDE 0.9 % IV SOLN
INTRAVENOUS | Status: DC
  Administered 2014-01-30: 11:00:00 via INTRAVENOUS
  Administered 2014-01-31: 75 mL/h via INTRAVENOUS
  Administered 2014-01-31 – 2014-02-01 (×2): via INTRAVENOUS

## 2014-01-30 MED ORDER — METOCLOPRAMIDE HCL 5 MG/ML IJ SOLN
10.0000 mg | Freq: Three times a day (TID) | INTRAMUSCULAR | Status: DC
  Administered 2014-01-30 – 2014-02-01 (×6): 10 mg via INTRAVENOUS
  Filled 2014-01-30 (×7): qty 2

## 2014-01-30 NOTE — Progress Notes (Addendum)
[facility] Progress Note   Geoffrey Richardson HEN:[id number] DOB: 05/15/1961 DOA: 01/25/2014 PCP: Philis Fendt, MD  Brief narrative: Geoffrey Richardson is a 53 y.o. 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   He presents with upper (epigastric) abdominal pian. Apparently this started about 2 mo ago and has been worse over the past few weeks. Initially the abdominal pain attributed to constipation, however the abdominal pain persists inspite of the constipation being relieved with bowel regimen. He claims that the pain is mostly in his upper abdomen, 10/10 at its worst, associated with significantly decreased appetite. However no nausea, vomiting or diarrhea.   CT scan of the abdomen showed increase in ascites and possible peritoneal metastases.   Subjective: Felt like the pain was going to come on last night and has been using the IV Toradol. Had a small BM this AM. Wants to try ice chips.   Assessment/Plan: Principal Problem:   Abdominal pain - Epigastric pain- Gastritis? Takes Naproxen at home BID- stopped PPI 4 mo ago - placed on PPI and Pepcid - EGD on 6/19 revealed food in stomach therefore mucosa was difficult to evaluate - ileus noted on Xray of abdomen despite being on Reglan since 6/19- there is no stool noted on xray- clear liquids causing increased distension- stopped POs - may be coming from increasing narcotic dosing- will stop BID Oxycontin (used for abd pain)  - IVF while NPO - cont Protonix and Pepcid (IV) which appear to have resulted in some improvement in pain - obtain xray of abdomen today to see if any improvement- increase Reglan dose from 5 to 10 - Dr [name] is not sure if omental mets are causing the PSBO- but if it doesn't resolve with holding narcotics, then it probably is coming from mets/  inflammation/ adhesions   Active Problems:   Ascites- malignant- with peritoneal mets  - IR perfomed paracentesis  - cytology positive- further plans by oncology- Dr [name] feels that he may not be able to treat him with chemo due to current poor performance- he recommends getting palliative care involved in his care    Severe malnutrition -NPO for now due to PSBO    Malignant pleural effusion/  non-small cell Cancer - loculated- previously had pleurex but removed as no fluid was draining   Chronic respiratory failure with hypoxia - Due to COPD and above mentioned effusion? - chronically on 2-3 L O2  UTI- e coli - sensitive to Rocephin - aysymptomatic - cont Rocephin -- cont  for total of 7 days- tomorrow is the last day    Code Status: Full code Family Communication: none- plan discussed in detail with the patient Disposition Plan: to be determined  Consultants: Dr [name]  Procedures: none  Antibiotics: Anti-infectives   Start     Dose/Rate Route Frequency Ordered Stop   01/26/14 1630  cefTRIAXone (ROCEPHIN) 1 g in dextrose 5 % 50 mL IVPB     1 g 100 mL/hr over 30 Minutes Intravenous Every 24 hours 01/25/14 1848     01/25/14 1600  cefTRIAXone (ROCEPHIN) 1 g in dextrose 5 % 50 mL IVPB     1 g 100 mL/hr over 30 Minutes Intravenous  Once 01/25/14 1549 01/25/14 1701       DVT prophylaxis: Lovenox  Objective: Filed Weights   01/25/14 1135 01/25/14 2021  Weight: 93.441 kg (206 lb) 85.957 kg (  189 lb 8 oz)    Vitals Filed Vitals:   01/29/14 2104 01/30/14 0201 01/30/14 0205 01/30/14 0513  BP: 134/93 137/91 132/92 132/92  Pulse: 95 88  103  Temp: 98.3 F (36.8 C) 98.4 F (36.9 C)  98.6 F (37 C)  TempSrc: Oral Oral  Oral  Resp: 18 16  18   Height:      Weight:      SpO2: 100% 100%  98%     No intake or output data in the 24 hours ending 01/30/14 8676   Exam: General: No acute respiratory distress- AAO x 3 Lungs: Clear to auscultation bilaterally  without wheezes or crackles Cardiovascular: Regular rate and rhythm without murmur gallop or rub normal S1 and S2 Abdomen: mildly tender in epigastrium, moderately distended and tympanic, soft, bowel sounds positive, no rebound Extremities: No significant cyanosis, clubbing, or edema bilateral lower extremities  Data Reviewed: Basic Metabolic Panel:  Recent Labs Lab 01/25/14 1140 01/25/14 2016 01/30/14 0818  NA 141 140 142  K 3.9 3.8 4.1  CL 99 97 99  CO2 23 25 30   GLUCOSE 90 72 105*  BUN 14 12 24*  CREATININE 0.64 0.65 0.91  CALCIUM 9.5 9.1 9.4   Liver Function Tests:  Recent Labs Lab 01/25/14 1140 01/25/14 2016  AST 10 10  ALT 5 <5  ALKPHOS 89 83  BILITOT 0.4 0.3  PROT 9.1* 8.5*  ALBUMIN 3.0* 2.8*    Recent Labs Lab 01/25/14 1140  LIPASE 10*   No results found for this basename: AMMONIA,  in the last 168 hours CBC:  Recent Labs Lab 01/25/14 1140 01/25/14 2016 01/26/14 0610 01/30/14 0818  WBC 7.6 7.8 6.6 7.3  NEUTROABS 5.5  --   --   --   HGB 12.3* 11.7* 11.1* 12.0*  HCT 40.8 39.3 37.5* 39.9  MCV 82.9 83.1 83.9 83.0  PLT 318 310 305 324   Cardiac Enzymes:  Recent Labs Lab 01/25/14 1455  TROPONINI <0.30   BNP (last 3 results) No results found for this basename: PROBNP,  in the last 8760 hours CBG: No results found for this basename: GLUCAP,  in the last 168 hours  Recent Results (from the past 240 hour(s))  URINE CULTURE     Status: None   Collection Time    01/25/14  1:34 PM      Result Value Ref Range Status   Specimen Description URINE, RANDOM   Final   Special Requests ADD [id number]   Final   Culture  Setup Time     Final   Value: 01/25/2014 18:57     Performed at [place]     Final   Value: >=100,000 COLONIES/ML     Performed at [facility]   Culture     Final   Value: ESCHERICHIA COLI     Performed at [facility]   Report Status 01/27/2014 FINAL   Final   Organism ID, Bacteria  ESCHERICHIA COLI   Final     Studies:  Recent x-ray studies have been reviewed in detail by the Attending Physician  Scheduled Meds:  Scheduled Meds: . bisacodyl  10 mg Rectal Once  . budesonide-formoterol  2 puff Inhalation BID  . cefTRIAXone (ROCEPHIN)  IV  1 g Intravenous Q24H  . enoxaparin (LOVENOX) injection  40 mg Subcutaneous Q24H  . famotidine (PEPCID) IV  20 mg Intravenous Q12H  . feeding supplement (RESOURCE BREEZE)  1 Container Oral  TID WC  . gi cocktail  30 mL Oral Once  . hydrALAZINE  25 mg Oral 3 times per day  . lactose free nutrition  237 mL Oral BID PC  . metoCLOPramide (REGLAN) injection  10 mg Intravenous 3 times per day  . multivitamin with minerals  1 tablet Oral Daily  . pantoprazole (PROTONIX) IV  40 mg Intravenous Q24H  . senna  2 tablet Oral QHS  . simethicone  80 mg Oral QID  . sodium chloride  3 mL Intravenous Q12H   Continuous Infusions:    Time spent on care of this patient: 35 min   [place], MD 01/30/2014, 9:52 AM  LOS: 5 days   [facility] Office  [phone] Pager - Text Page per Shea Evans   If 7PM-7AM, please contact night-coverage [email or web address]

## 2014-01-30 NOTE — Progress Notes (Signed)
Events noted. He continues to have issue with pain and abdominal distention.  This is likely related to his cancer and he is in a difficult spot. He is not well enough to receive aggressive chemotherapy due to his disease that will unlikely to improve with treatment.  Unless his disease is EGFR mutated (you can use oral agents with good success), his prognosis is poor.  I agree with palliative medicine evaluation for symptom management as well as goals of care.

## 2014-01-30 NOTE — Progress Notes (Signed)
UR complete.  Courtney Robarge RN, MSN 

## 2014-01-31 DIAGNOSIS — Z515 Encounter for palliative care: Secondary | ICD-10-CM

## 2014-01-31 DIAGNOSIS — J90 Pleural effusion, not elsewhere classified: Secondary | ICD-10-CM

## 2014-01-31 DIAGNOSIS — C801 Malignant (primary) neoplasm, unspecified: Secondary | ICD-10-CM

## 2014-01-31 LAB — BASIC METABOLIC PANEL
BUN: 25 mg/dL — AB (ref 6–23)
CHLORIDE: 100 meq/L (ref 96–112)
CO2: 25 meq/L (ref 19–32)
CREATININE: 0.71 mg/dL (ref 0.50–1.35)
Calcium: 9.2 mg/dL (ref 8.4–10.5)
GFR calc Af Amer: >90 mL/min (ref >90)
GFR calc non Af Amer: >90 mL/min (ref >90)
GLUCOSE: 96 mg/dL (ref 70–99)
Potassium: 4.4 mEq/L (ref 3.7–5.3)
Sodium: 142 mEq/L (ref 137–147)

## 2014-01-31 MED ORDER — HYDROMORPHONE HCL PF 1 MG/ML IJ SOLN
1.0000 mg | Freq: Once | INTRAMUSCULAR | Status: AC
  Administered 2014-01-31: 1 mg via INTRAVENOUS
  Filled 2014-01-31: qty 1

## 2014-01-31 MED ORDER — HYDROMORPHONE HCL PF 1 MG/ML IJ SOLN
INTRAMUSCULAR | Status: AC
  Filled 2014-01-31: qty 1

## 2014-01-31 MED ORDER — HYDROMORPHONE HCL PF 1 MG/ML IJ SOLN
1.0000 mg | INTRAMUSCULAR | Status: DC | PRN
  Administered 2014-02-01 – 2014-02-03 (×14): 1 mg via INTRAVENOUS
  Filled 2014-01-31 (×14): qty 1

## 2014-01-31 MED ORDER — HYDROMORPHONE HCL PF 1 MG/ML IJ SOLN
1.0000 mg | INTRAMUSCULAR | Status: DC | PRN
  Administered 2014-01-31: 1 mg via INTRAVENOUS

## 2014-01-31 NOTE — Progress Notes (Signed)
[facility] Progress Note   Rafay Dahan JJO:[id number] DOB: 09/15/1960 DOA: 01/25/2014 PCP: Philis Fendt, MD  Brief narrative: Geoffrey Richardson is a 53 y.o. 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   He presents with upper (epigastric) abdominal pian. Apparently this started about 2 mo ago and has been worse over the past few weeks. Initially the abdominal pain attributed to constipation, however the abdominal pain persists inspite of the constipation being relieved with bowel regimen. He claims that the pain is mostly in his upper abdomen, 10/10 at its worst, associated with significantly decreased appetite. However no nausea, vomiting or diarrhea.   CT scan of the abdomen showed increase in ascites and possible peritoneal metastases.   Subjective: No vomiting or abdominal pain- I have told the patient today about there being malignancy through out the abdomen. He is in shock and quite sad- Asking about what his options are.   Assessment/Plan: Principal Problem:   Abdominal pain - epigastric - cont Pepcid and Protonix - EGD on 6/19 revealed food in stomach therefore mucosa was difficult to evaluate  Ileus - pt notes his abdomen was distending after eating even prior to coming to the hospital - may have been having intermittent SBO - ileus noted on Xray of abdomen despite being on Reglan since 6/19- there is no stool noted on xray- clear liquids causing increased distension- stopped POs  - I suspected distension may be coming from increasing narcotic dosing- cut back on Narcotics but no improvement seen- will d/c Toradol and resume IV narcotis - obtained xray of abdomen to see if any improvement- increased Reglan dose from 5 to 10- no improvement noted-     Ascites- malignant- with peritoneal mets  - IR perfomed  paracentesis  - cytology positive for malignant cells- further plans by oncology- Dr [name] feels that he may not be able to treat him with chemo due to current poor performance- he recommends getting palliative care involved in his care_ I have spoken with Dr [name] and asked him to speak with the patient today    Severe malnutrition -NPO for now due to PSBO    Malignant pleural effusion/  non-small cell Cancer - loculated- previously had pleurex but removed as no fluid was draining   Chronic respiratory failure with hypoxia - Due to COPD and above mentioned effusion? - chronically on 2-3 L O2  UTI- e coli - sensitive to Rocephin - aysymptomatic -today is the last day of a 7 day course of Rocephin    Code Status: Full code Family Communication: with brother today-  Disposition Plan: to be determined  Consultants: Dr [name]  Procedures: none  Antibiotics: Anti-infectives   Start     Dose/Rate Route Frequency Ordered Stop   01/26/14 1630  cefTRIAXone (ROCEPHIN) 1 g in dextrose 5 % 50 mL IVPB     1 g 100 mL/hr over 30 Minutes Intravenous Every 24 hours 01/25/14 1848     01/25/14 1600  cefTRIAXone (ROCEPHIN) 1 g in dextrose 5 % 50 mL IVPB     1 g 100 mL/hr over 30 Minutes Intravenous  Once 01/25/14 1549 01/25/14 1701       DVT prophylaxis: Lovenox  Objective: Filed Weights   01/25/14 1135 01/25/14 2021  Weight: 93.441 kg (206 lb) 85.957 kg (189 lb 8 oz)    Vitals Filed Vitals:   01/30/14 2007 01/30/14  2225 01/31/14 0543 01/31/14 1335  BP:  132/97 143/97 130/97  Pulse:  108 97 99  Temp:  97.5 F (36.4 C) 97.7 F (36.5 C) 98.3 F (36.8 C)  TempSrc:  Oral Oral Oral  Resp:  20 20 20   Height:      Weight:      SpO2: 98% 100% 100% 99%      Intake/Output Summary (Last 24 hours) at 01/31/14 1640 Last data filed at 01/31/14 1558  Gross per 24 hour  Intake  837.5 ml  Output      0 ml  Net  837.5 ml     Exam: General: No acute respiratory distress- AAO  x 3 Lungs: Clear to auscultation bilaterally without wheezes or crackles Cardiovascular: Regular rate and rhythm without murmur gallop or rub normal S1 and S2 Abdomen: mildly tender in epigastrium, moderately distended and tympanic, soft, bowel sounds positive, no rebound Extremities: No significant cyanosis, clubbing, or edema bilateral lower extremities  Data Reviewed: Basic Metabolic Panel:  Recent Labs Lab 01/25/14 1140 01/25/14 2016 01/30/14 0818 01/31/14 0406  NA 141 140 142 142  K 3.9 3.8 4.1 4.4  CL 99 97 99 100  CO2 23 25 30 25   GLUCOSE 90 72 105* 96  BUN 14 12 24* 25*  CREATININE 0.64 0.65 0.91 0.71  CALCIUM 9.5 9.1 9.4 9.2   Liver Function Tests:  Recent Labs Lab 01/25/14 1140 01/25/14 2016  AST 10 10  ALT 5 <5  ALKPHOS 89 83  BILITOT 0.4 0.3  PROT 9.1* 8.5*  ALBUMIN 3.0* 2.8*    Recent Labs Lab 01/25/14 1140  LIPASE 10*   No results found for this basename: AMMONIA,  in the last 168 hours CBC:  Recent Labs Lab 01/25/14 1140 01/25/14 2016 01/26/14 0610 01/30/14 0818  WBC 7.6 7.8 6.6 7.3  NEUTROABS 5.5  --   --   --   HGB 12.3* 11.7* 11.1* 12.0*  HCT 40.8 39.3 37.5* 39.9  MCV 82.9 83.1 83.9 83.0  PLT 318 310 305 324   Cardiac Enzymes:  Recent Labs Lab 01/25/14 1455  TROPONINI <0.30   BNP (last 3 results) No results found for this basename: PROBNP,  in the last 8760 hours CBG: No results found for this basename: GLUCAP,  in the last 168 hours  Recent Results (from the past 240 hour(s))  URINE CULTURE     Status: None   Collection Time    01/25/14  1:34 PM      Result Value Ref Range Status   Specimen Description URINE, RANDOM   Final   Special Requests ADD [id number]   Final   Culture  Setup Time     Final   Value: 01/25/2014 18:57     Performed at [place]     Final   Value: >=100,000 COLONIES/ML     Performed at [facility]   Culture     Final   Value: ESCHERICHIA COLI     Performed  at [facility]   Report Status 01/27/2014 FINAL   Final   Organism ID, Bacteria ESCHERICHIA COLI   Final     Studies:  Recent x-ray studies have been reviewed in detail by the Attending Physician  Scheduled Meds:  Scheduled Meds: . bisacodyl  10 mg Rectal Once  . budesonide-formoterol  2 puff Inhalation BID  . cefTRIAXone (ROCEPHIN)  IV  1 g Intravenous Q24H  . enoxaparin (LOVENOX) injection  40 mg Subcutaneous Q24H  . famotidine (PEPCID) IV  20 mg Intravenous Q12H  . feeding supplement (RESOURCE BREEZE)  1 Container Oral TID WC  . gi cocktail  30 mL Oral Once  . hydrALAZINE  25 mg Oral 3 times per day  . lactose free nutrition  237 mL Oral BID PC  . metoCLOPramide (REGLAN) injection  10 mg Intravenous 3 times per day  . multivitamin with minerals  1 tablet Oral Daily  . pantoprazole (PROTONIX) IV  40 mg Intravenous Q24H  . senna  2 tablet Oral QHS  . simethicone  80 mg Oral QID  . sodium chloride  3 mL Intravenous Q12H   Continuous Infusions: . sodium chloride 75 mL/hr at 01/31/14 1446    Time spent on care of this patient: 35 min   [place], MD 01/31/2014, 4:40 PM  LOS: 6 days   [facility] Office  [phone] Pager - Text Page per Shea Evans   If 7PM-7AM, please contact night-coverage [email or web address]

## 2014-01-31 NOTE — Progress Notes (Signed)
IP PROGRESS NOTE  Subjective:   He is reporting slight improvement at this time. Able to keep small liquids down. Pain still an issue but comfortable at this point.    Objective:  Vital signs in last 24 hours: Temp:  [97.5 F (36.4 C)-98.3 F (36.8 C)] 98.3 F (36.8 C) (06/23 1335) Pulse Rate:  [97-109] 99 (06/23 1335) Resp:  [18-20] 20 (06/23 1335) BP: (130-144)/(97-103) 130/97 mmHg (06/23 1335) SpO2:  [98 %-100 %] 99 % (06/23 1335) Weight change:  Last BM Date: 01/25/14  Intake/Output from previous day: 06/22 0701 - 06/23 0700 In: 1127.5 [P.O.:180; I.V.:547.5; IV Piggyback:400] Out: -  Alert, awake not in distress.  Mouth: mucous membranes moist, pharynx normal without lesions Resp: clear to auscultation bilaterally Cardio: regular rate and rhythm, S1, S2 normal, no murmur, click, rub or gallop GI: Slightly distended with shifting dullness. Good bowel sounds tender to touch. No rebound or guarding. Extremities: extremities normal, atraumatic, no cyanosis or edema    Lab Results:  Recent Labs  01/30/14 0818  WBC 7.3  HGB 12.0*  HCT 39.9  PLT 324    BMET  Recent Labs  01/30/14 0818 01/31/14 0406  NA 142 142  K 4.1 4.4  CL 99 100  CO2 30 25  GLUCOSE 105* 96  BUN 24* 25*  CREATININE 0.91 0.71  CALCIUM 9.4 9.2      Medications: I have reviewed the patient's current medications.  Assessment/Plan:  53 year old gentleman with the following issues  1. Abdominal pain associated with peritoneal implants and ascites. Fluid cytology is positive for malignant cells. Awaiting further testing which will dictate future therapy. If EGFR mutation is detected, we can consider oral therapy.  I discussed him that he has an incurable cancer and we can only attempt to palliate it if he is able to eat and drink and has some meaningful recovery.  If that happens, we can consider treatments in the future. If he doesn't improve, then hospice will the way to go.  The next  24 to 48 hours will reveal that.   2. Adenocarcinoma presenting with pleural effusion: No evidence of any recurrent disease in the chest at this time.  3. Abdominal pain: S/P endoscopy on 01/28/2014. He might have an element of SBO that maybe improving.  I would continue supportive care and advance his diet slowly.    LOS: 6 days   [facility] 01/31/2014, 5:13 PM

## 2014-01-31 NOTE — Consult Note (Signed)
Patient Geoffrey Richardson      DOB: 1960-12-16      QTM:[id number]     Consult Note from the Palliative Medicine Team at [facility] Requested by: Dr. Wynelle Cleveland     PCP: Philis Fendt, MD Reason for Consultation: [place], metastatic cancer   Phone [phone]  Assessment of patients Current state: Geoffrey Richardson is a 53 yo male admitted with worsening abdominal pain from home. PMH of right-sided malignant pleural effusion thought to be non-small cell lung cancer s/p chemotherapy on observation. Now with abdominal pain and ascites suspicious for peritoneal metastases.   I spoke with Geoffrey Richardson and his brother yesterday. He is a very positive energy, positive thinker and tells me repetitively that he does not appreciate negativity and it is not welcome. He tells me that he is very much a family man and enjoys being with his family and this is the most important part of his life. He also is very religious and spiritual and says his mother used to wake them up as children to pray as a family at 05:30 am and that he is thankful she instilled good values in them now. He also enjoys drawing but has not felt up to this lately - he describes this as his escape and very therapeutic. 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 He is anxiously awaiting Dr. [name] to come and explain possible scenarios and come up with a plan of care. I will continue to follow and support in any way I am able.   Goals of Care: 1.  Code Status: FULL   2. Scope of Treatment: Continue all available and offered medical interventions.    4. Disposition: To be determined on outcomes.    3. Symptom Management:   1. Pain: Managed with Vicodin and toradol rotation. Tolerating orange sherbert.  2. Bowel Regimen: Reglan, miralax, senna scheduled.  3. Fever: Acetaminophen prn.  4. Nausea: Ondansetron and  promethazine prn.   4. Psychosocial: Emotional support provided to patient and family.    Brief HPI: 53 yo male treated and observed with non-small cell lung cancer now with likely peritoneal metastasis and abdominal pain.   ROS: + abdominal pain, denies nausea/vomitting/diarrhea, + constipation    PMH:  Past Medical History  Diagnosis Date  . Tobacco abuse     Began age 43  . Pleural effusion on right   . COPD (chronic obstructive pulmonary disease)   . Hypertension     pt states it fluctuates but not on any medications  . Emphysema   . Shortness of breath     lying/sitting/exertion  . Headache(784.0)     occasionally  . GERD (gastroesophageal reflux disease)     takes Protonix daily  . Nocturia   . Hypothyroidism     but not on medication at the present time  . Insomnia   . Lung cancer 02/04/2013     PSH: Past Surgical History  Procedure Laterality Date  . Appendectomy      as a child  . Video assisted thoracoscopy (vats) w/talc pleuadesis Right 01/14/2013    Procedure: VIDEO ASSISTED THORACOSCOPY (VATS) W/TALC PLEUADESIS;  Surgeon: Ivin Poot, MD;  Location: [facility];  Service: Thoracic;  Laterality: Right;  . Chest tube insertion Right 01/14/2013    Procedure: INSERTION PLEURAL DRAINAGE CATHETER;  Surgeon:  Ivin Poot, MD;  Location: [facility];  Service: Thoracic;  Laterality: Right;  . Removal of pleural drainage catheter N/A 04/22/2013    Procedure: MINOR REMOVAL OF PLEURAL DRAINAGE CATHETER;  Surgeon: Ivin Poot, MD;  Location: [facility] OR;  Service: Thoracic;  Laterality: N/A;  . Esophagogastroduodenoscopy N/A 01/27/2014    Procedure: ESOPHAGOGASTRODUODENOSCOPY (EGD);  Surgeon: Beryle Beams, MD;  Location: [facility] END[facility]COPY;  Service: Endoscopy;  Laterality: N/A;   I have reviewed the [place] and SH and  If appropriate update it with new information. No Known Allergies Scheduled Meds: . bisacodyl  10 mg Rectal Once  . budesonide-formoterol  2 puff Inhalation BID  .  cefTRIAXone (ROCEPHIN)  IV  1 g Intravenous Q24H  . enoxaparin (LOVENOX) injection  40 mg Subcutaneous Q24H  . famotidine (PEPCID) IV  20 mg Intravenous Q12H  . feeding supplement (RESOURCE BREEZE)  1 Container Oral TID WC  . gi cocktail  30 mL Oral Once  . hydrALAZINE  25 mg Oral 3 times per day  . lactose free nutrition  237 mL Oral BID PC  . metoCLOPramide (REGLAN) injection  10 mg Intravenous 3 times per day  . multivitamin with minerals  1 tablet Oral Daily  . pantoprazole (PROTONIX) IV  40 mg Intravenous Q24H  . senna  2 tablet Oral QHS  . simethicone  80 mg Oral QID  . sodium chloride  3 mL Intravenous Q12H   Continuous Infusions: . sodium chloride 75 mL/hr at 01/31/14 1446   PRN Meds:.sodium chloride, acetaminophen, acetaminophen, albuterol, alum & mag hydroxide-simeth, guaiFENesin-dextromethorphan, HYDROcodone-acetaminophen, ketorolac, ondansetron (ZOFRAN) IV, ondansetron, promethazine, sodium chloride    BP 130/97  Pulse 99  Temp(Src) 98.3 F (36.8 C) (Oral)  Resp 20  Ht 6' (1.829 m)  Wt 85.957 kg (189 lb 8 oz)  BMI 25.70 kg/m2  SpO2 99%   PPS: 30%   Intake/Output Summary (Last 24 hours) at 01/31/14 1536 Last data filed at 01/31/14 1000  Gross per 24 hour  Intake  777.5 ml  Output      0 ml  Net  777.5 ml   LBM: 01/31/14                        Physical Exam:  General: NAD, lying in bed, thin, frail  HEENT: [place]/AT, moist mucous membranes, no JVD  Chest: CTA throughout, no labored breathing, symmetric  CVS: RRR, S1 S2  Abdomen: Distended, + BS, tender to palpation in epigastrium  Ext: MAE, no edema, warm to touch  Neuro: Awake, alert, oriented x 3, follows commands    Labs: CBC    Component Value Date/Time   WBC 7.3 01/30/2014 0818   WBC 6.1 12/13/2013 0803   RBC 4.81 01/30/2014 0818   RBC 4.53 12/13/2013 0803   HGB 12.0* 01/30/2014 0818   HGB 11.3* 12/13/2013 0803   HCT 39.9 01/30/2014 0818   HCT 37.0* 12/13/2013 0803   PLT 324 01/30/2014 0818   PLT 267  12/13/2013 0803   MCV 83.0 01/30/2014 0818   MCV 81.8 12/13/2013 0803   MCH 24.9* 01/30/2014 0818   MCH 24.9* 12/13/2013 0803   MCHC 30.1 01/30/2014 0818   MCHC 30.5* 12/13/2013 0803   RDW 16.9* 01/30/2014 0818   RDW 18.5* 12/13/2013 0803   LYMPHSABS 1.7 01/25/2014 1140   LYMPHSABS 1.5 12/13/2013 0803   MONOABS 0.4 01/25/2014 1140   MONOABS 0.4 12/13/2013 0803   EOSABS 0.0 01/25/2014 1140  EOSABS 0.0 12/13/2013 0803   BASOSABS 0.0 01/25/2014 1140   BASOSABS 0.0 12/13/2013 0803    BMET    Component Value Date/Time   NA 142 01/31/2014 0406   NA 144 12/13/2013 0803   K 4.4 01/31/2014 0406   K 4.1 12/13/2013 0803   CL 100 01/31/2014 0406   CL 103 01/19/2013 1008   CO2 25 01/31/2014 0406   CO2 26 12/13/2013 0803   GLUCOSE 96 01/31/2014 0406   GLUCOSE 109 12/13/2013 0803   GLUCOSE 129* 01/19/2013 1008   BUN 25* 01/31/2014 0406   BUN 16.1 12/13/2013 0803   CREATININE 0.71 01/31/2014 0406   CREATININE 0.8 12/13/2013 0803   CALCIUM 9.2 01/31/2014 0406   CALCIUM 9.5 12/13/2013 0803   GFRNONAA >90 01/31/2014 0406   GFRAA >90 01/31/2014 0406    CMP     Component Value Date/Time   NA 142 01/31/2014 0406   NA 144 12/13/2013 0803   K 4.4 01/31/2014 0406   K 4.1 12/13/2013 0803   CL 100 01/31/2014 0406   CL 103 01/19/2013 1008   CO2 25 01/31/2014 0406   CO2 26 12/13/2013 0803   GLUCOSE 96 01/31/2014 0406   GLUCOSE 109 12/13/2013 0803   GLUCOSE 129* 01/19/2013 1008   BUN 25* 01/31/2014 0406   BUN 16.1 12/13/2013 0803   CREATININE 0.71 01/31/2014 0406   CREATININE 0.8 12/13/2013 0803   CALCIUM 9.2 01/31/2014 0406   CALCIUM 9.5 12/13/2013 0803   PROT 8.5* 01/25/2014 2016   PROT 8.3 12/13/2013 0803   ALBUMIN 2.8* 01/25/2014 2016   ALBUMIN 3.1* 12/13/2013 0803   AST 10 01/25/2014 2016   AST 10 12/13/2013 0803   ALT <5 01/25/2014 2016   ALT <6 12/13/2013 0803   ALKPHOS 83 01/25/2014 2016   ALKPHOS 94 12/13/2013 0803   BILITOT 0.3 01/25/2014 2016   BILITOT 0.21 12/13/2013 0803   GFRNONAA >90 01/31/2014 0406   GFRAA >90 01/31/2014 0406     Time In Time  Out Total Time Spent with Patient Total Overall Time  1435 1550 36min 42min    Greater than 50%  of this time was spent counseling and coordinating care related to the above assessment and plan.  Vinie Sill, NP Palliative Medicine Team Pager # [phone] (M-F 8a-5p) Team Phone # [phone] (Nights/Weekends)

## 2014-02-01 ENCOUNTER — Ambulatory Visit: Payer: Medicaid Other | Admitting: Cardiothoracic Surgery

## 2014-02-01 DIAGNOSIS — E43 Unspecified severe protein-calorie malnutrition: Secondary | ICD-10-CM

## 2014-02-01 DIAGNOSIS — Z515 Encounter for palliative care: Secondary | ICD-10-CM

## 2014-02-01 LAB — CREATININE, SERUM
Creatinine, Ser: 0.61 mg/dL (ref 0.50–1.35)
GFR calc Af Amer: >90 mL/min (ref >90)
GFR calc non Af Amer: >90 mL/min (ref >90)

## 2014-02-01 MED ORDER — FAMOTIDINE 20 MG PO TABS
20.0000 mg | ORAL_TABLET | Freq: Two times a day (BID) | ORAL | Status: DC
  Administered 2014-02-01 – 2014-02-03 (×4): 20 mg via ORAL
  Filled 2014-02-01 (×4): qty 1

## 2014-02-01 MED ORDER — FENTANYL 25 MCG/HR TD PT72
25.0000 ug | MEDICATED_PATCH | TRANSDERMAL | Status: DC
  Administered 2014-02-01: 25 ug via TRANSDERMAL
  Filled 2014-02-01: qty 1

## 2014-02-01 MED ORDER — LABETALOL HCL 100 MG PO TABS
100.0000 mg | ORAL_TABLET | Freq: Two times a day (BID) | ORAL | Status: DC
  Administered 2014-02-01 – 2014-02-03 (×5): 100 mg via ORAL
  Filled 2014-02-01 (×5): qty 1

## 2014-02-01 MED ORDER — POLYETHYLENE GLYCOL 3350 17 G PO PACK
17.0000 g | PACK | Freq: Every day | ORAL | Status: DC
  Administered 2014-02-01 – 2014-02-03 (×3): 17 g via ORAL
  Filled 2014-02-01 (×3): qty 1

## 2014-02-01 MED ORDER — METOCLOPRAMIDE HCL 10 MG PO TABS
10.0000 mg | ORAL_TABLET | Freq: Three times a day (TID) | ORAL | Status: DC
  Administered 2014-02-01 – 2014-02-03 (×9): 10 mg via ORAL
  Filled 2014-02-01 (×10): qty 1

## 2014-02-01 MED ORDER — PANTOPRAZOLE SODIUM 40 MG PO TBEC
40.0000 mg | DELAYED_RELEASE_TABLET | Freq: Every day | ORAL | Status: DC
  Administered 2014-02-01 – 2014-02-03 (×3): 40 mg via ORAL
  Filled 2014-02-01 (×3): qty 1

## 2014-02-01 NOTE — Progress Notes (Signed)
NUTRITION FOLLOW UP  DOCUMENTATION CODES  Per approved criteria   -Severe malnutrition in the context of chronic illness   Pt meets criteria for SEVERE MALNUTRITION in the context of CHRONIC ILLNESS as evidenced by 9% weight loss in less than 2 months and estimated energy intake <75% of estimated energy needs for > 1 month.  Intervention:   Continue Resource Breeze TID with meals Continue Boost Plus BID in between meals Continue Multivitamin with minerals daily Provided "Tips for Increasing Calories and Protein" and "High Protein Food List" from the Academy of Nutrition and Dietetics  Nutrition Dx:   Inadequate oral intake related to abdominal pain as evidenced by 9% weight loss in less than 2 months; ongoing  Goal:   Pt to meet >/= 90% of their estimated nutrition needs; unmet  Monitor:   PO intake, weight trend, labs, supplement acceptance  Assessment:   53 y.o. 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 Per patient, approximately 2 months back he started having abdominal pain that has worsened over the past few weeks.   6/18: Pt states that for the past 2 months he has been eating and drinking very little due to abdominal pain. He reports constant pain of stomach/chest that worsened with any PO intake. For the past 2 months he was able to tolerate an apple and 2-3 Boost supplements daily; estimated intake of [phone] calories daily. Pt states today he is able to eat better with pain under control; he ate most of his breakfast and about 75% of lunch as well as an orange [employer] which he really like and tolerated well.   MD note: IR perfomed paracentesis, cytology positive for malignant cells, awaiting final reports on the type of malignancy. Dr [name] feels that he  may not be able to treat him with chemo due to current poor performance- he recommends getting palliative care involved in his care Dr [name] spoke with the patient on 6/23 and stated that if he was able to eat, he would consider trying chemo - he plans on seeing him in the office after d/c-   6/24: Pt was on a liquid diet 6/19-6/20 and NPO 6/21-6/23 due to ileus/PSBO. Pt is now on a regular diet and report he is tolerating small amounts at each meal. He states he has been drinking Ensure/Boost supplements but, is taking a break from resource breeze. RD encouraged continued intake of supplements as well as small frequent meals/snacks due to inability to tolerate regular meals. RD provided and reviewed "Tips for Increasing Calories and Protein" and "High Protein Food List" from the Academy of Nutrition and Dietetics.  No new weight since admission.   Height: Ht Readings from Last 1 Encounters:  01/25/14 6' (1.829 m)    Weight Status:   Wt Readings from Last 1 Encounters:  01/25/14 189 lb 8 oz (85.957 kg)    Re-estimated needs:  Kcal: 2200-2400  Protein: >/=120 grams  Fluid: 2.4 L/day  Skin: intact  Diet Order: General   Intake/Output Summary (Last 24 hours) at 02/01/14 1325 Last data filed at 02/01/14 0830  Gross per 24 hour  Intake   2285 ml  Output      0 ml  Net   2285 ml    Last BM: 6/17   Labs:   Recent Labs Lab 01/25/14 2016 01/30/14  0818 01/31/14 0406 02/01/14 0610  NA 140 142 142  --   K 3.8 4.1 4.4  --   CL 97 99 100  --   CO2 25 30 25   --   BUN 12 24* 25*  --   CREATININE 0.65 0.91 0.71 0.61  CALCIUM 9.1 9.4 9.2  --   GLUCOSE 72 105* 96  --     CBG (last 3)  No results found for this basename: GLUCAP,  in the last 72 hours  Scheduled Meds: . bisacodyl  10 mg Rectal Once  . budesonide-formoterol  2 puff Inhalation BID  . enoxaparin (LOVENOX) injection  40 mg Subcutaneous Q24H  . famotidine  20 mg Oral BID  . feeding supplement (RESOURCE BREEZE)   1 Container Oral TID WC  . fentaNYL  25 mcg Transdermal Q72H  . gi cocktail  30 mL Oral Once  . hydrALAZINE  25 mg Oral 3 times per day  . labetalol  100 mg Oral BID  . lactose free nutrition  237 mL Oral BID PC  . metoCLOPramide  10 mg Oral TID AC & HS  . multivitamin with minerals  1 tablet Oral Daily  . pantoprazole  40 mg Oral Daily  . polyethylene glycol  17 g Oral Daily  . senna  2 tablet Oral QHS  . sodium chloride  3 mL Intravenous Q12H    Continuous Infusions:   Pryor Ochoa RD, LDN Inpatient Clinical Dietitian Pager: [phone] After Hours Pager: [phone]

## 2014-02-01 NOTE — Progress Notes (Addendum)
[facility] Progress Note   Geoffrey Richardson JYN:[id number] DOB: 09/17/1960 DOA: 01/25/2014 PCP: Philis Fendt, MD  Brief narrative: Geoffrey Richardson is a 53 y.o. 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   He presents with upper (epigastric) abdominal pian. Apparently this started about 2 mo ago and has been worse over the past few weeks. Initially the abdominal pain was attributed to constipation, however the abdominal pain persists inspite of the constipation being relieved with bowel regimen. He claims that the pain is mostly in his upper abdomen, 10/10 at its worst, associated with significantly decreased appetite. However no nausea, vomiting or diarrhea.   CT scan of the abdomen showed increase in ascites and peritoneal metastases.   Subjective: Started to eat solid food- diet ordered by Dr [name] yesterday- he is not vomiting. Abdominal is being controlled with IV Dialudid and PO Vicodins. No BM today - had one small one yesterday AM.  Assessment/Plan: Principal Problem:   Abdominal pain - epigastric- some component of GERD/ GAstrits - cont Pepcid and Protonix - EGD on 6/19 revealed food in stomach therefore mucosa was difficult to evaluate - further pain control with Fentanyl patch which I will start today and with Vicodin which is what he was taking at home  Ileus - pt notes his abdomen was distending after eating even prior to coming to the hospital - may have been having intermittent SBO - 6/20- ileus noted on Xray of abdomen despite being on Reglan since 6/19- there is no stool noted on xray- since he complained that clear liquids causing increased distension we stopped POs - stopped narcotics in attempt to see if ileus resolves- as it did not improve, narcotics were likely not the cause  - 6/23- switched to solids  by oncology, he is eating - I do hear bowel sounds today and although abdomen is distended he is not vomiting- I am still concerned that he may had intermittent partial SBO and am watching carefully for symptoms- will switch IV meds to oral as well and see if he can tolerate them - add Miralax to Senna, Colace and Reglan    Ascites- malignant- with peritoneal mets  - IR perfomed paracentesis  - cytology positive for malignant cells-- awaiting final reports on the type of malignancy  - Dr [name] feels that he may not be able to treat him with chemo due to current poor performance- he recommends getting palliative care involved in his care- I called Palliative care-  - Dr [name] spoke with the patient on 6/23 and stated that if he was able to eat, he would consider trying chemo - he plans on seeing him in the office after d/c-     Severe malnutrition - cont supplements- discussed a high protein diet- nutrition consulted     Malignant pleural effusion/  non-small cell Cancer - loculated- previously had pleurex but removed as no fluid was draining   Chronic respiratory failure with hypoxia - Due to COPD and above mentioned effusion? - chronically on 2-3 L O2  UTI- e coli - sensitive to Rocephin - has been aysymptomatic - 7 day course of Rocephin   HTN - started oral hydralazine- add labetalol today- avoid CCB to prevent further bowel slowing    Code Status: Full code Family Communication: with brother - Disposition Plan: attempting to switch to orals and discharge in 24-48 hrs-  Consultants: Dr [name]  Procedures: none  Antibiotics: Anti-infectives   Start     Dose/Rate Route Frequency Ordered Stop   01/26/14 1630  cefTRIAXone (ROCEPHIN) 1 g in dextrose 5 % 50 mL IVPB  Status:  Discontinued     1 g 100 mL/hr over 30 Minutes Intravenous Every 24 hours 01/25/14 1848 01/31/14 1702   01/25/14 1600  cefTRIAXone (ROCEPHIN) 1 g in dextrose 5 % 50 mL IVPB     1 g 100 mL/hr over 30  Minutes Intravenous  Once 01/25/14 1549 01/25/14 1701       DVT prophylaxis: Lovenox  Objective: Filed Weights   01/25/14 1135 01/25/14 2021  Weight: 93.441 kg (206 lb) 85.957 kg (189 lb 8 oz)    Vitals Filed Vitals:   01/31/14 2058 01/31/14 2235 02/01/14 0620 02/01/14 0958  BP:  143/101 141/112 146/120  Pulse:  119 102 105  Temp:  97.7 F (36.5 C) 98 F (36.7 C) 97.5 F (36.4 C)  TempSrc:  Oral Oral Oral  Resp:  18 18 20   Height:      Weight:      SpO2: 88% 93% 99% 100%      Intake/Output Summary (Last 24 hours) at 02/01/14 1111 Last data filed at 02/01/14 0830  Gross per 24 hour  Intake   2285 ml  Output      0 ml  Net   2285 ml     Exam: General: No acute respiratory distress- AAO x 3 Lungs: Clear to auscultation bilaterally without wheezes or crackles Cardiovascular: Regular rate and rhythm without murmur gallop or rub normal S1 and S2 Abdomen: mildly tender in epigastrium, moderately distended and tympanic, soft, bowel sounds positive, no rebound Extremities: No significant cyanosis, clubbing, or edema bilateral lower extremities  Data Reviewed: Basic Metabolic Panel:  Recent Labs Lab 01/25/14 1140 01/25/14 2016 01/30/14 0818 01/31/14 0406 02/01/14 0610  NA 141 140 142 142  --   K 3.9 3.8 4.1 4.4  --   CL 99 97 99 100  --   CO2 23 25 30 25   --   GLUCOSE 90 72 105* 96  --   BUN 14 12 24* 25*  --   CREATININE 0.64 0.65 0.91 0.71 0.61  CALCIUM 9.5 9.1 9.4 9.2  --    Liver Function Tests:  Recent Labs Lab 01/25/14 1140 01/25/14 2016  AST 10 10  ALT 5 <5  ALKPHOS 89 83  BILITOT 0.4 0.3  PROT 9.1* 8.5*  ALBUMIN 3.0* 2.8*    Recent Labs Lab 01/25/14 1140  LIPASE 10*   No results found for this basename: AMMONIA,  in the last 168 hours CBC:  Recent Labs Lab 01/25/14 1140 01/25/14 2016 01/26/14 0610 01/30/14 0818  WBC 7.6 7.8 6.6 7.3  NEUTROABS 5.5  --   --   --   HGB 12.3* 11.7* 11.1* 12.0*  HCT 40.8 39.3 37.5* 39.9  MCV  82.9 83.1 83.9 83.0  PLT 318 310 305 324   Cardiac Enzymes:  Recent Labs Lab 01/25/14 1455  TROPONINI <0.30   BNP (last 3 results) No results found for this basename: PROBNP,  in the last 8760 hours CBG: No results found for this basename: GLUCAP,  in the last 168 hours  Recent Results (from the past 240 hour(s))  URINE CULTURE     Status: None   Collection Time    01/25/14  1:34 PM      Result Value Ref Range Status  Specimen Description URINE, RANDOM   Final   Special Requests ADD [id number]   Final   Culture  Setup Time     Final   Value: 01/25/2014 18:57     Performed at [facility]     Final   Value: >=100,000 COLONIES/ML     Performed at [facility]   Culture     Final   Value: ESCHERICHIA COLI     Performed at [facility]   Report Status 01/27/2014 FINAL   Final   Organism ID, Bacteria ESCHERICHIA COLI   Final     Studies:  Recent x-ray studies have been reviewed in detail by the Attending Physician  Scheduled Meds:  Scheduled Meds: . bisacodyl  10 mg Rectal Once  . budesonide-formoterol  2 puff Inhalation BID  . enoxaparin (LOVENOX) injection  40 mg Subcutaneous Q24H  . famotidine  20 mg Oral BID  . feeding supplement (RESOURCE BREEZE)  1 Container Oral TID WC  . fentaNYL  25 mcg Transdermal Q72H  . gi cocktail  30 mL Oral Once  . hydrALAZINE  25 mg Oral 3 times per day  . lactose free nutrition  237 mL Oral BID PC  . metoCLOPramide  10 mg Oral TID AC & HS  . multivitamin with minerals  1 tablet Oral Daily  . pantoprazole  40 mg Oral Daily  . polyethylene glycol  17 g Oral Daily  . senna  2 tablet Oral QHS  . sodium chloride  3 mL Intravenous Q12H   Continuous Infusions: . sodium chloride 75 mL/hr at 02/01/14 0622    Time spent on care of this patient: 34 min   [place], MD 02/01/2014, 11:11 AM  LOS: 7 days   [facility] Office  [phone] Pager - Text Page per Shea Evans   If 7PM-7AM,  please contact night-coverage [email or web address]

## 2014-02-01 NOTE — Progress Notes (Signed)
Full note to follow:  I spoke with Geoffrey Richardson and his brother yesterday. He is a very positive energy, positive thinker and tells me repetitively that he does not appreciate negativity and it is not welcome. He tells me that he is very much a family man and enjoys being with his family and this is the most important part of his life. He also is very religious and spiritual and says his mother used to wake them up as children to pray as a family at 05:30 am and that he is thankful she instilled good values in them now. He also enjoys drawing but has not felt up to this lately - he describes this as his escape and very therapeutic. 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 He is anxiously awaiting Dr. [name] to come and explain possible scenarios and come up with a plan of care. I will continue to follow and support in any way I am able.  Vinie Sill, NP Palliative Medicine Team Pager # [phone] (M-F 8a-5p) Team Phone # [phone] (Nights/Weekends)

## 2014-02-01 NOTE — Progress Notes (Addendum)
Progress Note from the Palliative Medicine Team at [facility]: I spoke with Dellis Filbert and his brother and sister at bedside. I answered their questions the best I could. Johnnell tells me that he is very optimistic after talking with Dr. [name]. We discussed the importance to get his pain managed so that he can attempt to have more intake and nutrition which is necessary for him to have any treatment. His brother asked me if we knew how aggressive this cancer could be and Toretto interrupted that he did not want to know and only wanted to discuss the positive and nothing negative. I have note approached the subject of hospice with Dellis Filbert as he is clear that he wants full aggressive treatment. He says he is not ready to die and that he doesn't feel like he has yet fulfilled his purpose.  We also discussed his pain management in which the IV dilaudid is more helpful than the vicodin and he says that he has been able to eat better after receiving IV dilaudid but not with the vicodin (IV works quicker). He says he tries not to worry the nurses and tries to wait as long as he can before asking for medication. I encouraged him to ask for the medication earlier and not to wait until his pain is out of control because then it is more difficult to get back to a tolerable level. He describes his pain as shooting across his upper abdomen and the pain is intermittent and is sometimes dull but other times sharp and throbbing and is worse when he eats/drinks. He says that his pain comes even with sips of water taking his pain medication.     Objective: No Known Allergies Scheduled Meds: . bisacodyl  10 mg Rectal Once  . budesonide-formoterol  2 puff Inhalation BID  . enoxaparin (LOVENOX) injection  40 mg Subcutaneous Q24H  . famotidine  20 mg Oral BID  . feeding supplement (RESOURCE BREEZE)  1 Container Oral TID WC  . fentaNYL  25 mcg Transdermal Q72H  . gi cocktail  30 mL Oral Once  . hydrALAZINE   25 mg Oral 3 times per day  . labetalol  100 mg Oral BID  . lactose free nutrition  237 mL Oral BID PC  . metoCLOPramide  10 mg Oral TID AC & HS  . multivitamin with minerals  1 tablet Oral Daily  . pantoprazole  40 mg Oral Daily  . polyethylene glycol  17 g Oral Daily  . senna  2 tablet Oral QHS  . sodium chloride  3 mL Intravenous Q12H   Continuous Infusions:  PRN Meds:.sodium chloride, acetaminophen, acetaminophen, albuterol, alum & mag hydroxide-simeth, guaiFENesin-dextromethorphan, HYDROcodone-acetaminophen, HYDROmorphone (DILAUDID) injection, ondansetron (ZOFRAN) IV, ondansetron, promethazine, sodium chloride  BP 159/118  Pulse 114  Temp(Src) 98.1 F (36.7 C) (Oral)  Resp 20  Ht 6' (1.829 m)  Wt 85.957 kg (189 lb 8 oz)  BMI 25.70 kg/m2  SpO2 99%   PPS: 30%  Pain Score: 10/10 at times Pain Location upper abdomen   Intake/Output Summary (Last 24 hours) at 02/01/14 1817 Last data filed at 02/01/14 1800  Gross per 24 hour  Intake 3657.5 ml  Output      0 ml  Net 3657.5 ml      LBM: 01/31/14      Physical Exam:  General: NAD, lying in bed, thin, frail HEENT: [place]/AT, moist mucous membranes, no JVD Chest: CTA throughout, no labored breathing, symmetric CVS: RRR, S1  S2 Abdomen: Distended, + BS, tender to palpation in epigastrium Ext: No edema, MAE, warm to touch Neuro: Awake, alert, oriented x 3, follows commands  Labs: CBC    Component Value Date/Time   WBC 7.3 01/30/2014 0818   WBC 6.1 12/13/2013 0803   RBC 4.81 01/30/2014 0818   RBC 4.53 12/13/2013 0803   HGB 12.0* 01/30/2014 0818   HGB 11.3* 12/13/2013 0803   HCT 39.9 01/30/2014 0818   HCT 37.0* 12/13/2013 0803   PLT 324 01/30/2014 0818   PLT 267 12/13/2013 0803   MCV 83.0 01/30/2014 0818   MCV 81.8 12/13/2013 0803   MCH 24.9* 01/30/2014 0818   MCH 24.9* 12/13/2013 0803   MCHC 30.1 01/30/2014 0818   MCHC 30.5* 12/13/2013 0803   RDW 16.9* 01/30/2014 0818   RDW 18.5* 12/13/2013 0803   LYMPHSABS 1.7 01/25/2014 1140   LYMPHSABS  1.5 12/13/2013 0803   MONOABS 0.4 01/25/2014 1140   MONOABS 0.4 12/13/2013 0803   EOSABS 0.0 01/25/2014 1140   EOSABS 0.0 12/13/2013 0803   BASOSABS 0.0 01/25/2014 1140   BASOSABS 0.0 12/13/2013 0803    BMET    Component Value Date/Time   NA 142 01/31/2014 0406   NA 144 12/13/2013 0803   K 4.4 01/31/2014 0406   K 4.1 12/13/2013 0803   CL 100 01/31/2014 0406   CL 103 01/19/2013 1008   CO2 25 01/31/2014 0406   CO2 26 12/13/2013 0803   GLUCOSE 96 01/31/2014 0406   GLUCOSE 109 12/13/2013 0803   GLUCOSE 129* 01/19/2013 1008   BUN 25* 01/31/2014 0406   BUN 16.1 12/13/2013 0803   CREATININE 0.61 02/01/2014 0610   CREATININE 0.8 12/13/2013 0803   CALCIUM 9.2 01/31/2014 0406   CALCIUM 9.5 12/13/2013 0803   GFRNONAA >90 02/01/2014 0610   GFRAA >90 02/01/2014 0610    CMP     Component Value Date/Time   NA 142 01/31/2014 0406   NA 144 12/13/2013 0803   K 4.4 01/31/2014 0406   K 4.1 12/13/2013 0803   CL 100 01/31/2014 0406   CL 103 01/19/2013 1008   CO2 25 01/31/2014 0406   CO2 26 12/13/2013 0803   GLUCOSE 96 01/31/2014 0406   GLUCOSE 109 12/13/2013 0803   GLUCOSE 129* 01/19/2013 1008   BUN 25* 01/31/2014 0406   BUN 16.1 12/13/2013 0803   CREATININE 0.61 02/01/2014 0610   CREATININE 0.8 12/13/2013 0803   CALCIUM 9.2 01/31/2014 0406   CALCIUM 9.5 12/13/2013 0803   PROT 8.5* 01/25/2014 2016   PROT 8.3 12/13/2013 0803   ALBUMIN 2.8* 01/25/2014 2016   ALBUMIN 3.1* 12/13/2013 0803   AST 10 01/25/2014 2016   AST 10 12/13/2013 0803   ALT <5 01/25/2014 2016   ALT <6 12/13/2013 0803   ALKPHOS 83 01/25/2014 2016   ALKPHOS 94 12/13/2013 0803   BILITOT 0.3 01/25/2014 2016   BILITOT 0.21 12/13/2013 0803   GFRNONAA >90 02/01/2014 0610   GFRAA >90 02/01/2014 0610    Assessment and Plan: 1. Code Status: FULL 2. Symptom Control: 1. Pain: 1. Continue Fentanyl patch. Consider titration as needed.  2. For breakthrough pain: Consider starting dilaudid po at 2 mg every 3 hours. May also consider Oxyfast 10 mg every 4 hours prn if believed fast acting  medication may be more helpful.  3. Bowel Regimen: Miralax, senna, and reglan daily. Consider adding dulcolax supp or fleets enema.  4. Fever: Acetaminophen prn.  5. Nausea: Ondansetron and promethazine prn.  3.  Psycho/Social: Emotional support provided to patient and family.  4. Disposition: To be determined on outcomes    Time In Time Out Total Time Spent with Patient Total Overall Time  1510 1610 58min 57min    Greater than 50%  of this time was spent counseling and coordinating care related to the above assessment and plan.  Vinie Sill, NP Palliative Medicine Team Pager # [phone] (M-F 8a-5p) Team Phone # [phone] (Nights/Weekends)   1

## 2014-02-02 MED ORDER — HYDROMORPHONE HCL 2 MG PO TABS: 2.0000 mg | ORAL_TABLET | ORAL | Status: DC | PRN

## 2014-02-02 MED ORDER — DOCUSATE SODIUM 100 MG PO CAPS
100.0000 mg | ORAL_CAPSULE | Freq: Two times a day (BID) | ORAL | Status: DC
  Administered 2014-02-02 – 2014-02-03 (×3): 100 mg via ORAL
  Filled 2014-02-02 (×3): qty 1

## 2014-02-02 MED ORDER — HYDROMORPHONE HCL 2 MG PO TABS
1.0000 mg | ORAL_TABLET | ORAL | Status: DC | PRN
  Administered 2014-02-02 – 2014-02-03 (×3): 2 mg via ORAL
  Filled 2014-02-02 (×3): qty 1

## 2014-02-02 MED ORDER — HYDROMORPHONE HCL 2 MG PO TABS: 1.0000 mg | ORAL_TABLET | ORAL | Status: DC | PRN

## 2014-02-02 MED ORDER — BISACODYL 10 MG RE SUPP: 10.0000 mg | Freq: Every day | RECTAL | Status: DC | PRN

## 2014-02-02 MED ORDER — BISACODYL 10 MG RE SUPP
10.0000 mg | RECTAL | Status: AC
  Administered 2014-02-02: 10 mg via RECTAL
  Filled 2014-02-02: qty 1

## 2014-02-02 MED ORDER — OXYCODONE HCL ER 10 MG PO T12A
20.0000 mg | EXTENDED_RELEASE_TABLET | Freq: Two times a day (BID) | ORAL | Status: DC
  Administered 2014-02-02 – 2014-02-03 (×3): 20 mg via ORAL
  Filled 2014-02-02 (×3): qty 2

## 2014-02-02 MED ORDER — OXYCODONE HCL ER 10 MG PO T12A: 20.0000 mg | EXTENDED_RELEASE_TABLET | Freq: Two times a day (BID) | ORAL | Status: DC

## 2014-02-02 NOTE — Progress Notes (Addendum)
[facility] Progress Note   [name] WJX:[id number] DOB: June 30, 1961 DOA: 01/25/2014 PCP: Philis Fendt, MD  Brief narrative: Geoffrey Richardson is a 53 y.o. 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   He presents with upper (epigastric) abdominal pian. Apparently this started about 2 mo ago and has been worse over the past few weeks. Initially the abdominal pain was attributed to constipation, however the abdominal pain persists inspite of the constipation being relieved with bowel regimen. He claims that the pain is mostly in his upper abdomen, 10/10 at its worst, associated with significantly decreased appetite. However no nausea, vomiting or diarrhea.   CT scan of the abdomen showed increase in ascites and peritoneal metastases.   Subjective: tolerting solid food- fentanyl patch not enough to control pain  Assessment/Plan: Principal Problem:   Abdominal pain - epigastric- some component of GERD/ GAstrits - cont Pepcid and Protonix - EGD on 6/19 revealed food in stomach therefore mucosa was difficult to evaluate - most pain likely from abdominal malignancy, ascites and intermittent SBO- see below - Fentanyl patch 25 mcg not sufficient- resume Oxycontin - he is also insisting on oral dilaudid for the past 2 days (as it works fast) although I have explained to him numerous times that it does not work as fast as the IV dilaudid- I will go ahead and switch to oral dilaudid now- d/c IV Dilaudid.   Ileus- intermittent PSBO - pt notes his abdomen was distending after eating even prior to coming to the hospital - may have been having intermittent SBO - 6/20- ileus noted on Xray of abdomen despite being on Reglan since 6/19- there is no stool noted on xray- since he complained that clear liquids causing increased distension we  stopped POs - stopped narcotics in attempt to see if ileus resolves- as it did not improve, narcotics were likely not the cause  - 6/23- switched to solids by oncology, he is eating - I do hear bowel sounds and although abdomen is distended- he is not vomiting- I am still concerned that he may had intermittent partial SBO and am watching carefully for symptoms- - Has not had BM in 2 days despite Miralax- have advised him to take a dulcolax suppository - have spoken with surgery who states that there no appropriate surgical treatment for him and palliative care is the best course of action    Ascites- malignant- with peritoneal mets  - IR perfomed paracentesis  - cytology positive for malignant cells-- awaiting final reports on the type of malignancy  - Dr [name] feels that he may not be able to treat him with chemo due to current poor performance- he recommends getting palliative care involved in his care- I called Palliative care-  - Dr [name] spoke with the patient on 6/23 and stated that if he was able to eat, he would consider trying chemo - he plans on seeing him in the office after d/c- - he continues to decline help from hospice and states he is not ready to die- he has been given "hope By Dr [name]"    Severe malnutrition - cont supplements- discussed a high protein diet- nutrition consulted     Malignant pleural effusion/  non-small cell Cancer - loculated- previously had pleurex but removed as no fluid was draining   Chronic respiratory failure with hypoxia - Due to COPD and above mentioned effusion? -  chronically on 2-3 L O2  UTI- e coli - sensitive to Rocephin - has been aysymptomatic - 7 day course of Rocephin   HTN - started oral hydralazine- add labetalol today- avoid CCB to prevent further bowel slowing    Code Status: Full code Family Communication: with brother - Disposition Plan: attempting to switch to orals and discharge in 24-48 hrs- he continues to decline  hospice - I do not feel he will improve sufficiently to have chemo but will assist him to continue with his plan to see Dr [name] for chemo as outpt  Consultants: Dr [name]  Procedures: none  Antibiotics: Anti-infectives   Start     Dose/Rate Route Frequency Ordered Stop   01/26/14 1630  cefTRIAXone (ROCEPHIN) 1 g in dextrose 5 % 50 mL IVPB  Status:  Discontinued     1 g 100 mL/hr over 30 Minutes Intravenous Every 24 hours 01/25/14 1848 01/31/14 1702   01/25/14 1600  cefTRIAXone (ROCEPHIN) 1 g in dextrose 5 % 50 mL IVPB     1 g 100 mL/hr over 30 Minutes Intravenous  Once 01/25/14 1549 01/25/14 1701       DVT prophylaxis: Lovenox  Objective: Filed Weights   01/25/14 1135 01/25/14 2021  Weight: 93.441 kg (206 lb) 85.957 kg (189 lb 8 oz)    Vitals Filed Vitals:   02/02/14 0856 02/02/14 0935 02/02/14 1434 02/02/14 1739  BP:  126/90 124/91 138/97  Pulse:  101 110 105  Temp:  97.6 F (36.4 C) 97.2 F (36.2 C) 98.1 F (36.7 C)  TempSrc:  Oral Oral Oral  Resp:  18 18 18   Height:      Weight:      SpO2: 92% 99% 95% 100%      Intake/Output Summary (Last 24 hours) at 02/02/14 1748 Last data filed at 02/02/14 1700  Gross per 24 hour  Intake    540 ml  Output      0 ml  Net    540 ml     Exam: General: No acute respiratory distress- AAO x 3 Lungs: Clear to auscultation bilaterally without wheezes or crackles Cardiovascular: Regular rate and rhythm without murmur gallop or rub normal S1 and S2 Abdomen: mildly tender in epigastrium, moderately distended and tympanic, soft, bowel sounds positive, no rebound Extremities: No significant cyanosis, clubbing, or edema bilateral lower extremities  Data Reviewed: Basic Metabolic Panel:  Recent Labs Lab 01/30/14 0818 01/31/14 0406 02/01/14 0610  NA 142 142  --   K 4.1 4.4  --   CL 99 100  --   CO2 30 25  --   GLUCOSE 105* 96  --   BUN 24* 25*  --   CREATININE 0.91 0.71 0.61  CALCIUM 9.4 9.2  --    Liver  Function Tests: No results found for this basename: AST, ALT, ALKPHOS, BILITOT, PROT, ALBUMIN,  in the last 168 hours No results found for this basename: LIPASE, AMYLASE,  in the last 168 hours No results found for this basename: AMMONIA,  in the last 168 hours CBC:  Recent Labs Lab 01/30/14 0818  WBC 7.3  HGB 12.0*  HCT 39.9  MCV 83.0  PLT 324   Cardiac Enzymes: No results found for this basename: CKTOTAL, CKMB, CKMBINDEX, TROPONINI,  in the last 168 hours BNP (last 3 results) No results found for this basename: PROBNP,  in the last 8760 hours CBG: No results found for this basename: GLUCAP,  in the last 168 hours  Recent Results (from the past 240 hour(s))  URINE CULTURE     Status: None   Collection Time    01/25/14  1:34 PM      Result Value Ref Range Status   Specimen Description URINE, RANDOM   Final   Special Requests ADD [id number]   Final   Culture  Setup Time     Final   Value: 01/25/2014 18:57     Performed at [facility]     Final   Value: >=100,000 COLONIES/ML     Performed at [facility]   Culture     Final   Value: ESCHERICHIA COLI     Performed at [facility]   Report Status 01/27/2014 FINAL   Final   Organism ID, Bacteria ESCHERICHIA COLI   Final     Studies:  Recent x-ray studies have been reviewed in detail by the Attending Physician  Scheduled Meds:  Scheduled Meds: . budesonide-formoterol  2 puff Inhalation BID  . docusate sodium  100 mg Oral BID  . enoxaparin (LOVENOX) injection  40 mg Subcutaneous Q24H  . famotidine  20 mg Oral BID  . feeding supplement (RESOURCE BREEZE)  1 Container Oral TID WC  . fentaNYL  25 mcg Transdermal Q72H  . gi cocktail  30 mL Oral Once  . hydrALAZINE  25 mg Oral 3 times per day  . labetalol  100 mg Oral BID  . lactose free nutrition  237 mL Oral BID PC  . metoCLOPramide  10 mg Oral TID AC & HS  . multivitamin with minerals  1 tablet Oral Daily  . OxyCODONE  20 mg  Oral Q12H  . pantoprazole  40 mg Oral Daily  . polyethylene glycol  17 g Oral Daily  . senna  2 tablet Oral QHS  . sodium chloride  3 mL Intravenous Q12H   Continuous Infusions:    Time spent on care of this patient: 35 min   [place], MD 02/02/2014, 5:48 PM  LOS: 8 days   [facility] Office  [phone] Pager - Text Page per Shea Evans   If 7PM-7AM, please contact night-coverage [email or web address]

## 2014-02-02 NOTE — Progress Notes (Signed)
Progress Note from the Palliative Medicine Team at [facility]: Mr. Rumpf is still very optimistic and hoping for good outcomes. He is resistant to hearing even the possibility of poor outcomes - he "believes in positive thinking." He also seems to have some denial and resistance to his diagnosis and prognosis. He says that he feels better after talking with Dr. [name] and that he feels he "has hope" after talking with him.   We discussed his pain and there has not been much change although he is tolerating drink and some food better. He is resistant to a suppository to relieve his constipation (no BM since Monday and then it was small) and distention. We discussed the importance of this to help relieve some of his pain and the pressure on his abdomen. We discussed that if he desires full treatment he has to do his part - he reluctantly agreed.    Objective: No Known Allergies Scheduled Meds: . budesonide-formoterol  2 puff Inhalation BID  . docusate sodium  100 mg Oral BID  . enoxaparin (LOVENOX) injection  40 mg Subcutaneous Q24H  . famotidine  20 mg Oral BID  . feeding supplement (RESOURCE BREEZE)  1 Container Oral TID WC  . fentaNYL  25 mcg Transdermal Q72H  . gi cocktail  30 mL Oral Once  . hydrALAZINE  25 mg Oral 3 times per day  . labetalol  100 mg Oral BID  . lactose free nutrition  237 mL Oral BID PC  . metoCLOPramide  10 mg Oral TID AC & HS  . multivitamin with minerals  1 tablet Oral Daily  . OxyCODONE  20 mg Oral Q12H  . pantoprazole  40 mg Oral Daily  . polyethylene glycol  17 g Oral Daily  . senna  2 tablet Oral QHS  . sodium chloride  3 mL Intravenous Q12H   Continuous Infusions:  PRN Meds:.sodium chloride, acetaminophen, acetaminophen, albuterol, alum & mag hydroxide-simeth, [START ON 02/03/2014] bisacodyl, guaiFENesin-dextromethorphan, HYDROcodone-acetaminophen, HYDROmorphone (DILAUDID) injection, ondansetron (ZOFRAN) IV, ondansetron, promethazine, sodium  chloride  BP 124/91  Pulse 110  Temp(Src) 97.2 F (36.2 C) (Oral)  Resp 18  Ht 6' (1.829 m)  Wt 85.957 kg (189 lb 8 oz)  BMI 25.70 kg/m2  SpO2 95%   PPS: 30%     Intake/Output Summary (Last 24 hours) at 02/02/14 1600 Last data filed at 02/02/14 0900  Gross per 24 hour  Intake    420 ml  Output      0 ml  Net    420 ml      LBM: 01/31/14    Stool Softner: yes  Physical Exam:  General: NAD, lying in bed, thin, frail  HEENT: [place]/AT, moist mucous membranes, no JVD  Chest: CTA throughout, no labored breathing, symmetric  CVS: RRR, S1 S2  Abdomen: Distended, + BS, tender to palpation in epigastrium  Ext: No edema, MAE, warm to touch  Neuro: Awake, alert, oriented x 3, follows commands    Labs: CBC    Component Value Date/Time   WBC 7.3 01/30/2014 0818   WBC 6.1 12/13/2013 0803   RBC 4.81 01/30/2014 0818   RBC 4.53 12/13/2013 0803   HGB 12.0* 01/30/2014 0818   HGB 11.3* 12/13/2013 0803   HCT 39.9 01/30/2014 0818   HCT 37.0* 12/13/2013 0803   PLT 324 01/30/2014 0818   PLT 267 12/13/2013 0803   MCV 83.0 01/30/2014 0818   MCV 81.8 12/13/2013 0803   MCH 24.9* 01/30/2014 0818  MCH 24.9* 12/13/2013 0803   MCHC 30.1 01/30/2014 0818   MCHC 30.5* 12/13/2013 0803   RDW 16.9* 01/30/2014 0818   RDW 18.5* 12/13/2013 0803   LYMPHSABS 1.7 01/25/2014 1140   LYMPHSABS 1.5 12/13/2013 0803   MONOABS 0.4 01/25/2014 1140   MONOABS 0.4 12/13/2013 0803   EOSABS 0.0 01/25/2014 1140   EOSABS 0.0 12/13/2013 0803   BASOSABS 0.0 01/25/2014 1140   BASOSABS 0.0 12/13/2013 0803    BMET    Component Value Date/Time   NA 142 01/31/2014 0406   NA 144 12/13/2013 0803   K 4.4 01/31/2014 0406   K 4.1 12/13/2013 0803   CL 100 01/31/2014 0406   CL 103 01/19/2013 1008   CO2 25 01/31/2014 0406   CO2 26 12/13/2013 0803   GLUCOSE 96 01/31/2014 0406   GLUCOSE 109 12/13/2013 0803   GLUCOSE 129* 01/19/2013 1008   BUN 25* 01/31/2014 0406   BUN 16.1 12/13/2013 0803   CREATININE 0.61 02/01/2014 0610   CREATININE 0.8 12/13/2013 0803   CALCIUM 9.2  01/31/2014 0406   CALCIUM 9.5 12/13/2013 0803   GFRNONAA >90 02/01/2014 0610   GFRAA >90 02/01/2014 0610    CMP     Component Value Date/Time   NA 142 01/31/2014 0406   NA 144 12/13/2013 0803   K 4.4 01/31/2014 0406   K 4.1 12/13/2013 0803   CL 100 01/31/2014 0406   CL 103 01/19/2013 1008   CO2 25 01/31/2014 0406   CO2 26 12/13/2013 0803   GLUCOSE 96 01/31/2014 0406   GLUCOSE 109 12/13/2013 0803   GLUCOSE 129* 01/19/2013 1008   BUN 25* 01/31/2014 0406   BUN 16.1 12/13/2013 0803   CREATININE 0.61 02/01/2014 0610   CREATININE 0.8 12/13/2013 0803   CALCIUM 9.2 01/31/2014 0406   CALCIUM 9.5 12/13/2013 0803   PROT 8.5* 01/25/2014 2016   PROT 8.3 12/13/2013 0803   ALBUMIN 2.8* 01/25/2014 2016   ALBUMIN 3.1* 12/13/2013 0803   AST 10 01/25/2014 2016   AST 10 12/13/2013 0803   ALT <5 01/25/2014 2016   ALT <6 12/13/2013 0803   ALKPHOS 83 01/25/2014 2016   ALKPHOS 94 12/13/2013 0803   BILITOT 0.3 01/25/2014 2016   BILITOT 0.21 12/13/2013 0803   GFRNONAA >90 02/01/2014 0610   GFRAA >90 02/01/2014 0610     Assessment and Plan:  1. Code Status: FULL 2. Symptom Control:  1. Pain:  1. Continue Fentanyl patch. Consider titration as needed.  2. For breakthrough pain: Consider starting dilaudid po at 2 mg every 3 hours. He is requesting dilaudid to help. I did explain that the pill will not work any quicker than the vicodin but he will need to take it earlier and not wait until the pain is unmanageable.  3. Bowel Regimen: Miralax, senna, colace and reglan daily. Dulcolax supp x 1 and prn.  4. Fever: Acetaminophen prn.  5. Nausea: Ondansetron and promethazine prn.  3. Psycho/Social: Emotional support provided to patient and family.  4. Disposition: Home when pain better managed.     Time In Time Out Total Time Spent with Patient Total Overall Time  1530 1600 37min 33min    Greater than 50%  of this time was spent counseling and coordinating care related to the above assessment and plan.  Vinie Sill, NP Palliative  Medicine Team Pager # [phone] (M-F 8a-5p) Team Phone # [phone] (Nights/Weekends)

## 2014-02-02 NOTE — Progress Notes (Signed)
UR complete.  Courtney Robarge RN, MSN 

## 2014-02-03 MED ORDER — FENTANYL 25 MCG/HR TD PT72: 25.0000 ug | MEDICATED_PATCH | TRANSDERMAL | Status: DC

## 2014-02-03 MED ORDER — POLYETHYLENE GLYCOL 3350 17 G PO PACK: 17.0000 g | PACK | Freq: Two times a day (BID) | ORAL | Status: DC

## 2014-02-03 MED ORDER — SENNOSIDES-DOCUSATE SODIUM 8.6-50 MG PO TABS: 1.0000 | ORAL_TABLET | Freq: Every day | ORAL | Status: DC

## 2014-02-03 MED ORDER — ONDANSETRON 4 MG PO TBDP: 4.0000 mg | ORAL_TABLET | Freq: Three times a day (TID) | ORAL | Status: AC | PRN

## 2014-02-03 MED ORDER — OXYCODONE HCL ER 30 MG PO T12A: 30.0000 mg | EXTENDED_RELEASE_TABLET | Freq: Two times a day (BID) | ORAL | Status: DC

## 2014-02-03 MED ORDER — BOOST PLUS PO LIQD: 237.0000 mL | Freq: Three times a day (TID) | ORAL | Status: AC

## 2014-02-03 MED ORDER — ADULT MULTIVITAMIN W/MINERALS CH: 1.0000 | ORAL_TABLET | Freq: Every day | ORAL | Status: AC

## 2014-02-03 MED ORDER — HYDRALAZINE HCL 25 MG PO TABS: 25.0000 mg | ORAL_TABLET | Freq: Three times a day (TID) | ORAL | Status: AC

## 2014-02-03 MED ORDER — METOCLOPRAMIDE HCL 10 MG PO TABS: 10.0000 mg | ORAL_TABLET | Freq: Three times a day (TID) | ORAL | Status: AC

## 2014-02-03 MED ORDER — PANTOPRAZOLE SODIUM 40 MG PO TBEC: 40.0000 mg | DELAYED_RELEASE_TABLET | Freq: Every day | ORAL | Status: AC

## 2014-02-03 MED ORDER — BISACODYL 10 MG RE SUPP: 10.0000 mg | Freq: Every day | RECTAL | Status: DC | PRN

## 2014-02-03 MED ORDER — LABETALOL HCL 100 MG PO TABS: 100.0000 mg | ORAL_TABLET | Freq: Two times a day (BID) | ORAL | Status: AC

## 2014-02-03 MED ORDER — SENNOSIDES-DOCUSATE SODIUM 8.6-50 MG PO TABS: 2.0000 | ORAL_TABLET | Freq: Every day | ORAL | Status: DC

## 2014-02-03 MED ORDER — HYDROMORPHONE HCL 2 MG PO TABS: 1.0000 mg | ORAL_TABLET | ORAL | Status: DC | PRN

## 2014-02-03 MED ORDER — ALBUTEROL SULFATE HFA 108 (90 BASE) MCG/ACT IN AERS: 2.0000 | INHALATION_SPRAY | RESPIRATORY_TRACT | Status: AC | PRN

## 2014-02-03 MED ORDER — FAMOTIDINE 20 MG PO TABS: 20.0000 mg | ORAL_TABLET | Freq: Two times a day (BID) | ORAL | Status: AC

## 2014-02-03 NOTE — Discharge Summary (Addendum)
Physician Discharge Summary  Geoffrey Richardson ONG:[id number] DOB: 28-Jul-1961 DOA: 01/25/2014  PCP: Geoffrey Fendt, MD  Admit date: 01/25/2014 Discharge date: 02/03/2014  Time spent: >45 minutes  Recommendations for Outpatient Follow-up:  1. Ensure patient is not becoming severely constipated on the multiple narcotics  2. we will arrange a home health RN  Family communication Communicated with brother who is very involved in care The patient also has a male friend who keeps him at her house and cares for him- Geoffrey Richardson she has been updated as well  Discharge Diagnoses:  Principal Problem:   Abdominal pain Active Problems:   Severe malnutrition   Malignant pleural effusion- small cell cancer   Malignant ascites- small cell cancer   Chronic respiratory failure with hypoxia   Protein-calorie malnutrition, severe   Ileus   Palliative care encounter   Discharge Condition: stable  Diet recommendation: as tolerated-   Filed Weights   01/25/14 1135 01/25/14 2021  Weight: 93.441 kg (206 lb) 85.957 kg (189 lb 8 oz)    History of present illness:  Geoffrey Richardson is a 53 y.o. 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  He presents with upper (epigastric) abdominal pian. Apparently this started about 2 mo ago and has been worse over the past few weeks. Initially the abdominal pain was attributed to constipation, however the abdominal pain persists inspite of the constipation being relieved with bowel regimen. He claims that the pain is mostly in his upper abdomen, 10/10 at its worst, associated with significantly decreased appetite. However no nausea, vomiting or diarrhea.  CT scan of the abdomen showed increase in ascites and peritoneal metastases.      Hospital Course:  Principal Problem:  Abdominal pain  - epigastric-  some component of GERD/ Gastrits - cont Pepcid and Protonix - EGD on 6/19 revealed food in stomach therefore mucosa was difficult to evaluate - trial of Reglan recommended - most pain likely from abdominal malignancy, ascites and intermittent SBO-   - pt notes his abdomen was distending after eating even prior to coming to the hospital - may have been having intermittent SBO  - 6/20- ileus noted on Xray of abdomen despite being on Reglan since 6/19- there is no stool noted on xray- since he complained that clear liquids causing increased distension we stopped POs  - stopped narcotics in attempt to see if ileus resolves- as it did not improve, narcotics were likely not the cause  - 6/23- switched to solids by oncology, he is eating - I do hear bowel sounds and although abdomen is distended- he is not vomiting- I am still concerned that he may had intermittent partial SBO and am watching carefully for symptoms-  -6/23  have spoken with surgery who states that there no appropriate surgical treatment for him and palliative care is the best course of action  - pain now controlled finally on multiple narcotics which he will receive prescriptions for on d/c (see below)  - Pt declined hospice even for help with pain management - constipation is to be avoided at all costs and prevention methods have been discussed in detail with patient and family    Ascites- malignant- with peritoneal mets  - IR perfomed paracentesis - cytology positive for malignant cells-- appears to be small cell cancer- - 6/21 Dr [name] feels that he may not be able to treat him with chemo  due to current poor performance- he recommends getting palliative care involved in his care- I called Palliative care-  - 6/23 Dr [name] spoke with the patient and stated that if he was able to eat, he would consider trying chemo - he plans on seeing him in the office after d/c- - -the patient he continues to decline help from hospice and states he is  not ready to die- he tells me that he has been given hope By Dr [name]   Severe malnutrition  - cont supplements- discussed a high protein diet- nutrition consulted to advise on food choices   Malignant pleural effusion/ non-small cell Cancer  - loculated- previously had pleurex but removed as no fluid was draining   Chronic respiratory failure with hypoxia  - Due to COPD and above mentioned effusion?  - chronically on 2-3 L O2   UTI- e coli  - sensitive to Rocephin  - has been aysymptomatic  - 7 day course of Rocephin completed  HTN  - started oral hydralazine- added labetalol  - avoid CCB to prevent further bowel slowing  - further management per PCP  Procedures:  6/19- EGD- Dr Benson Norway- IMPRESSION:  1) Retained gastric contents.  RECOMMENDATIONS:  1) Trial of Reglan. Given his current clinical status I do not feel  there is a need to perform a formal gastric emptying scan.  2) The pain may be as a result of his metastatic spread.  6/18- LLQ US guided paracentesis- 1.4 L blood tinged fluid  Consultations:  IR  Onc  GI  Hospice  Discharge Exam: Filed Vitals:   02/03/14 1335  BP:   Pulse: 110  Temp: 97.4 F (36.3 C)  Resp: 20    General: No acute respiratory distress- AAO x 3  Lungs: Clear to auscultation bilaterally without wheezes or crackles  Cardiovascular: Regular rate and rhythm without murmur gallop or rub normal S1 and S2  Abdomen: mildly tender in epigastrium, severely distended and tympanic, soft, bowel sounds positive, no rebound  Extremities: No significant cyanosis, clubbing, or edema bilateral lower extremities   Discharge Instructions You were cared for by a hospitalist during your hospital stay. If you have any questions about your discharge medications or the care you received while you were in the hospital after you are discharged, you can call the unit and asked to speak with the hospitalist on call if the hospitalist that took care of  you is not available. Once you are discharged, your primary care physician will handle any further medical issues. Please note that NO REFILLS for any discharge medications will be authorized once you are discharged, as it is imperative that you return to your primary care physician (or establish a relationship with a primary care physician if you do not have one) for your aftercare needs so that they can reassess your need for medications and monitor your lab values.      Discharge Instructions   Discharge instructions    Complete by:  As directed   High protein, high calorie diet - Make sure you have good size a BM every day     Increase activity slowly    Complete by:  As directed             Medication List    STOP taking these medications       bismuth subsalicylate 267 TI/45YK suspension  Commonly known as:  PEPTO BISMOL     docusate sodium 100 MG capsule  Commonly known as:  COLACE     HYDROcodone-acetaminophen 5-325 MG per tablet  Commonly known as:  NORCO/VICODIN     naproxen sodium 220 MG tablet  Commonly known as:  ANAPROX      TAKE these medications       albuterol 108 (90 BASE) MCG/ACT inhaler  Commonly known as:  PROVENTIL HFA;VENTOLIN HFA  Inhale 2 puffs into the lungs every 4 (four) hours as needed for wheezing or shortness of breath.     bisacodyl 10 MG suppository  Commonly known as:  DULCOLAX  Place 1 suppository (10 mg total) rectally daily as needed for moderate constipation.     budesonide-formoterol 160-4.5 MCG/ACT inhaler  Commonly known as:  SYMBICORT  Inhale 1 puff into the lungs daily.     famotidine 20 MG tablet  Commonly known as:  PEPCID  Take 1 tablet (20 mg total) by mouth 2 (two) times daily.     fentaNYL 25 MCG/HR patch  Commonly known as:  DURAGESIC - dosed mcg/hr  Place 1 patch (25 mcg total) onto the skin every 3 (three) days.     GAS-X MAXIMUM STRENGTH PO  Take 1-2 tablets by mouth 4 (four) times daily as needed (stomach  pains).     hydrALAZINE 25 MG tablet  Commonly known as:  APRESOLINE  Take 1 tablet (25 mg total) by mouth every 8 (eight) hours.     HYDROmorphone 2 MG tablet  Commonly known as:  DILAUDID  Take 0.5-1.5 tablets (1-3 mg total) by mouth every 3 (three) hours as needed for severe pain.     labetalol 100 MG tablet  Commonly known as:  NORMODYNE  Take 1 tablet (100 mg total) by mouth 2 (two) times daily.     lactose free nutrition Liqd  Take 237 mLs by mouth 3 (three) times daily.     metoCLOPramide 10 MG tablet  Commonly known as:  REGLAN  Take 1 tablet (10 mg total) by mouth 4 (four) times daily -  before meals and at bedtime.     multivitamin with minerals Tabs tablet  Take 1 tablet by mouth daily.     ondansetron 4 MG disintegrating tablet  Commonly known as:  ZOFRAN ODT  Take 1-2 tablets (4-8 mg total) by mouth every 8 (eight) hours as needed for nausea or vomiting.     OxyCODONE HCl ER 30 MG T12a  Take 30 mg by mouth every 12 (twelve) hours.     pantoprazole 40 MG tablet  Commonly known as:  PROTONIX  Take 1 tablet (40 mg total) by mouth daily.     polyethylene glycol packet  Commonly known as:  MIRALAX / GLYCOLAX  Take 17 g by mouth 2 (two) times daily.     polyethylene glycol packet  Commonly known as:  MIRALAX  Take 17 g by mouth 2 (two) times daily.     senna-docusate 8.6-50 MG per tablet  Commonly known as:  Senokot-S  Take 2 tablets by mouth at bedtime.       No Known Allergies Follow-up Information   Follow up with [facility], [place].   Specialty:  Oncology   Contact information:   [address] [place] [address] [phone]        The results of significant diagnostics from this hospitalization (including imaging, microbiology, ancillary and laboratory) are listed below for reference.    Significant Diagnostic Studies: Dg Chest 2 View  01/18/2014   CLINICAL DATA:  Lung cancer. Chest tightness and shortness of  breath.  EXAM: CHEST  2 VIEW   COMPARISON:  CT chest 12/13/2013 and chest radiograph 11/30/2013.  FINDINGS: Trachea is midline. Heart size stable. A large loculated pleural fluid collection in the lower right hemi thorax is unchanged. There is associated collapse/consolidation. Left lung is clear. No pleural fluid.  IMPRESSION: Large loculated pleural fluid collection in the lower right hemi thorax with adjacent collapse/ consolidation, unchanged. Findings were better evaluated on 12/13/2013.   Electronically Signed   By: Lorin Picket M.D.   On: 01/18/2014 13:06   Ct Chest W Contrast  01/25/2014   CLINICAL DATA:  One month history of mid abdominal pain with constipation ; history of stage IV lung malignancy  EXAM: CT CHEST, ABDOMEN, AND PELVIS WITH CONTRAST  TECHNIQUE: Multidetector CT imaging of the chest, abdomen and pelvis was performed following the standard protocol during bolus administration of intravenous contrast.  CONTRAST:  135mL OMNIPAQUE IOHEXOL 300 MG/ML  SOLN  COMPARISON:  CT scan of the chest of Dec 13, 2013 and CT scan of the abdomen and pelvis dated November 29, 2013.  FINDINGS: CT CHEST FINDINGS  There is stable parenchymal lung consolidation in the right lower lobe medially. There is mild shift of the mediastinum toward the left. There is no left pleural effusion. The left lung is clear and exhibits mild emphysematous change.  The cardiac chambers are normal in size. The caliber of the thoracic aorta is normal. There are stable enlarged right paratracheal and right hilar lymph nodes. The bony thorax exhibits no acute abnormalities.  CT ABDOMEN AND PELVIS FINDINGS  There is now ascites throughout the abdomen and pelvis. The liver exhibits no focal mass or ductal dilation. The gallbladder, pancreas, spleen, adrenal glands, and kidneys exhibit no acute abnormalities. There is a stable 1.5 cm mid/upper pole cyst on the right. The caliber of the abdominal aorta is normal.  The stomach and small and large bowel exhibit no evidence  of obstruction. There is increased density within the mesenteric fat in the left mid and upper abdomen which may reflect the presence of peritoneal implants. The urinary bladder exhibits a moderate impression upon its base by the prostate gland. There is no inguinal nor umbilical hernia. The lumbar spine and pelvis exhibit no acute abnormalities.  IMPRESSION: 1. There is a stable appearance of the abnormal right hemithorax with a large loculated right pleural effusion and persistent right lower lobe atelectasis. No acute change is demonstrated elsewhere. 2. The ascites has increased in volume and is now moderate in volume. There may be peritoneal metastatic disease to the left of midline in the mid and upper abdomen. 3. There is no bowel obstruction. 4. There is no acute hepatobiliary nor acute urinary tract abnormality.   Electronically Signed   By: David  Martinique   On: 01/25/2014 16:25   Ct Abdomen Pelvis W Contrast  01/25/2014   CLINICAL DATA:  One month history of mid abdominal pain with constipation ; history of stage IV lung malignancy  EXAM: CT CHEST, ABDOMEN, AND PELVIS WITH CONTRAST  TECHNIQUE: Multidetector CT imaging of the chest, abdomen and pelvis was performed following the standard protocol during bolus administration of intravenous contrast.  CONTRAST:  15mL OMNIPAQUE IOHEXOL 300 MG/ML  SOLN  COMPARISON:  CT scan of the chest of Dec 13, 2013 and CT scan of the abdomen and pelvis dated November 29, 2013.  FINDINGS: CT CHEST FINDINGS  There is stable parenchymal lung consolidation in the right lower lobe medially. There is mild shift  of the mediastinum toward the left. There is no left pleural effusion. The left lung is clear and exhibits mild emphysematous change.  The cardiac chambers are normal in size. The caliber of the thoracic aorta is normal. There are stable enlarged right paratracheal and right hilar lymph nodes. The bony thorax exhibits no acute abnormalities.  CT ABDOMEN AND PELVIS FINDINGS   There is now ascites throughout the abdomen and pelvis. The liver exhibits no focal mass or ductal dilation. The gallbladder, pancreas, spleen, adrenal glands, and kidneys exhibit no acute abnormalities. There is a stable 1.5 cm mid/upper pole cyst on the right. The caliber of the abdominal aorta is normal.  The stomach and small and large bowel exhibit no evidence of obstruction. There is increased density within the mesenteric fat in the left mid and upper abdomen which may reflect the presence of peritoneal implants. The urinary bladder exhibits a moderate impression upon its base by the prostate gland. There is no inguinal nor umbilical hernia. The lumbar spine and pelvis exhibit no acute abnormalities.  IMPRESSION: 1. There is a stable appearance of the abnormal right hemithorax with a large loculated right pleural effusion and persistent right lower lobe atelectasis. No acute change is demonstrated elsewhere. 2. The ascites has increased in volume and is now moderate in volume. There may be peritoneal metastatic disease to the left of midline in the mid and upper abdomen. 3. There is no bowel obstruction. 4. There is no acute hepatobiliary nor acute urinary tract abnormality.   Electronically Signed   By: David  Martinique   On: 01/25/2014 16:25   US Paracentesis  01/26/2014   CLINICAL DATA:  Abdominal ascites  EXAM: ULTRASOUND GUIDED left lower PARACENTESIS  COMPARISON:  None.  PROCEDURE: An ultrasound guided paracentesis was thoroughly discussed with the patient and questions answered. The benefits, risks, alternatives and complications were also discussed. The patient understands and wishes to proceed with the procedure. Written consent was obtained.  Ultrasound was performed to localize and mark an adequate pocket of fluid in the left low quadrant of the abdomen. The area was then prepped and draped in the normal sterile fashion. 1% Lidocaine was used for local anesthesia. Under ultrasound guidance a 19  gauge Yueh catheter was introduced. Paracentesis was performed. The catheter was removed and a dressing applied.  Complications: None.  FINDINGS: A total of approximately 1.4 L of blood tends fluid was removed. A fluid sample was sent for laboratory analysis.  IMPRESSION: Successful ultrasound guided paracentesis yielding 1.4 L of ascites.  Read by: Jannifer Franklin [facility]   Electronically Signed   By: Jacqulynn Cadet M.D.   On: 01/26/2014 15:35   Dg Abd 2 Views  01/29/2014   CLINICAL DATA:  Bloating, nausea  EXAM: ABDOMEN - 2 VIEW  COMPARISON:  01/27/2014 abdominal film, 01/25/2014 CT scan  FINDINGS: There are again numerous of mildly distended loops of small bowel, which also demonstrate air-fluid levels. Small bowel is distended up to 4.9 cm, representing an increase from 3.7 cm. Oral contrast is identified however into the colon. Oral contrast is seen to the level of the mid transverse colon, without evidence of colonic dilatation.  There is elevation of the right diaphragm consistent with sub pulmonic pleural effusion. There is nonspecific left upper quadrant density measuring 8 mm in with by 6.8 cm in length oriented horizontally over the stomach.  IMPRESSION: 1. Given the known the presence of ascites, the dilated small bowel with air-fluid levels could represent ileus. Supporting  this is the fact that oral contrast has progressed into the colon. However, there is increased small bowel dilatation when compared to 01/27/14 and the possibility of partial low-grade small bowel obstruction is not excluded. 2. Known subpulmonic right pleural effusion 3. Wispy abnormal density left upper quadrant of uncertain origin. This may represent residual oral contrast within the stomach.   Electronically Signed   By: Skipper Cliche M.D.   On: 01/29/2014 10:32   Dg Abd Portable 1v  01/30/2014   CLINICAL DATA:  Abdominal distention  EXAM: PORTABLE ABDOMEN - 1 VIEW  COMPARISON:  01/29/2014  FINDINGS: Scattered large and small  bowel gas is identified. Some mildly loops of small bowel remain. Correlation with the clinical exam is again recommended. No bony abnormality is seen. No free air is noted.  IMPRESSION: Stable appearance of dilated small bowel loops.   Electronically Signed   By: Inez Catalina M.D.   On: 01/30/2014 10:32   Dg Abd Portable 1v  01/27/2014   CLINICAL DATA:  Worsening abdominal pain.  Ascites.  EXAM: PORTABLE ABDOMEN - 1 VIEW  COMPARISON:  CT of the abdomen and pelvis performed 01/25/2014  FINDINGS: Status post recent paracentesis, previously noted central displacement of the bowel is no longer visualized.  There is distention of small-bowel loops to 3.7 cm in maximal diameter. However, contrast has progressed to the colon, suggesting against obstruction. This could reflect small bowel dysmotility, or if the patient's abdominal pain began very recently, partial obstruction cannot be entirely excluded.  No free intra-abdominal air is identified, though evaluation for free air is limited on this single supine view. No acute osseous abnormalities are seen.  IMPRESSION: 1. No definite evidence of recurrent ascites, status post recent paracentesis. 2. Distention of small bowel loops to 3.7 cm in maximal diameter. However, recently administered contrast has progressed to the colon, suggesting against obstruction. This could reflect small bowel dysmotility, or if the patient's abdominal pain began very recently, partial obstruction cannot be entirely excluded. No free intra-abdominal air seen.   Electronically Signed   By: Garald Balding M.D.   On: 01/27/2014 03:54    Microbiology: Recent Results (from the past 240 hour(s))  URINE CULTURE     Status: None   Collection Time    01/25/14  1:34 PM      Result Value Ref Range Status   Specimen Description URINE, RANDOM   Final   Special Requests ADD [id number]   Final   Culture  Setup Time     Final   Value: 01/25/2014 18:57     Performed at [facility]     Final   Value: >=100,000 COLONIES/ML     Performed at [facility]   Culture     Final   Value: ESCHERICHIA COLI     Performed at [facility]   Report Status 01/27/2014 FINAL   Final   Organism ID, Bacteria ESCHERICHIA COLI   Final     Labs: Basic Metabolic Panel:  Recent Labs Lab 01/30/14 0818 01/31/14 0406 02/01/14 0610  NA 142 142  --   K 4.1 4.4  --   CL 99 100  --   CO2 30 25  --   GLUCOSE 105* 96  --   BUN 24* 25*  --   CREATININE 0.91 0.71 0.61  CALCIUM 9.4 9.2  --    Liver Function Tests: No results found for this basename: AST, ALT, ALKPHOS,  BILITOT, PROT, ALBUMIN,  in the last 168 hours No results found for this basename: LIPASE, AMYLASE,  in the last 168 hours No results found for this basename: AMMONIA,  in the last 168 hours CBC:  Recent Labs Lab 01/30/14 0818  WBC 7.3  HGB 12.0*  HCT 39.9  MCV 83.0  PLT 324   Cardiac Enzymes: No results found for this basename: CKTOTAL, CKMB, CKMBINDEX, TROPONINI,  in the last 168 hours BNP: BNP (last 3 results) No results found for this basename: PROBNP,  in the last 8760 hours CBG: No results found for this basename: GLUCAP,  in the last 168 hours     Signed:  Debbe Odea, MD [facility] 02/03/2014, 4:02 PM

## 2014-02-03 NOTE — Progress Notes (Signed)
02/03/14 [place] RN, MSN, CM- Met with patient to discuss home health needs. Patient has chosen [facility], which he has used in the past.  Spoke with Butch Penny with [facility], who has accepted the referral for discharge home today.

## 2014-02-03 NOTE — Progress Notes (Signed)
Geoffrey Richardson reports better relief with his current pain regimen and has been able to tolerate more intake - asks for more orange juice. He ate a good amount for dinner and breakfast and is not having any associated pain. He was also able to have a bowel movement last night. I encouraged that he may need repeat doses of dulcolax supp at home with continued narcotics for pain relief. He is happy to be going home soon.  Vinie Sill, NP Palliative Medicine Team Pager # [phone] (M-F 8a-5p) Team Phone # [phone] (Nights/Weekends)

## 2014-02-06 NOTE — Consult Note (Signed)
I have reviewed and discussed the care of this patient in detail with the nurse practitioner including pertinent patient records, physical exam findings and data. I agree with details of this encounter.  

## 2014-02-07 ENCOUNTER — Other Ambulatory Visit: Payer: Self-pay | Admitting: *Deleted

## 2014-02-07 MED ORDER — HYDROCODONE-ACETAMINOPHEN 5-325 MG PO TABS: 1.0000 | ORAL_TABLET | Freq: Four times a day (QID) | ORAL | Status: DC | PRN

## 2014-02-07 NOTE — Telephone Encounter (Signed)
Patient prefers hydrocodone to dilaudid and oxycodone. Ok per dr [name]. Script left at front for patient p/u. Patient notified

## 2014-02-22 ENCOUNTER — Emergency Department (HOSPITAL_COMMUNITY): Payer: Medicaid Other

## 2014-02-22 ENCOUNTER — Inpatient Hospital Stay (HOSPITAL_COMMUNITY): Payer: Medicaid Other

## 2014-02-22 ENCOUNTER — Encounter (HOSPITAL_COMMUNITY): Payer: Self-pay | Admitting: Emergency Medicine

## 2014-02-22 ENCOUNTER — Inpatient Hospital Stay (HOSPITAL_COMMUNITY)
Admission: EM | Admit: 2014-02-22 | Discharge: 2014-03-08 | DRG: 374 | Disposition: A | Discharge status: Hospice | Payer: Medicaid Other | Attending: Internal Medicine | Admitting: Internal Medicine

## 2014-02-22 DIAGNOSIS — J9610 Chronic respiratory failure, unspecified whether with hypoxia or hypercapnia: Secondary | ICD-10-CM | POA: Diagnosis present

## 2014-02-22 DIAGNOSIS — R112 Nausea with vomiting, unspecified: Secondary | ICD-10-CM | POA: Diagnosis present

## 2014-02-22 DIAGNOSIS — E43 Unspecified severe protein-calorie malnutrition: Secondary | ICD-10-CM

## 2014-02-22 DIAGNOSIS — I10 Essential (primary) hypertension: Secondary | ICD-10-CM

## 2014-02-22 DIAGNOSIS — B370 Candidal stomatitis: Secondary | ICD-10-CM | POA: Diagnosis present

## 2014-02-22 DIAGNOSIS — G4700 Insomnia, unspecified: Secondary | ICD-10-CM | POA: Diagnosis present

## 2014-02-22 DIAGNOSIS — Z6826 Body mass index (BMI) 26.0-26.9, adult: Secondary | ICD-10-CM

## 2014-02-22 DIAGNOSIS — R609 Edema, unspecified: Secondary | ICD-10-CM | POA: Diagnosis present

## 2014-02-22 DIAGNOSIS — Z9981 Dependence on supplemental oxygen: Secondary | ICD-10-CM

## 2014-02-22 DIAGNOSIS — R64 Cachexia: Secondary | ICD-10-CM | POA: Diagnosis present

## 2014-02-22 DIAGNOSIS — R066 Hiccough: Secondary | ICD-10-CM

## 2014-02-22 DIAGNOSIS — R0600 Dyspnea, unspecified: Secondary | ICD-10-CM

## 2014-02-22 DIAGNOSIS — R1033 Periumbilical pain: Secondary | ICD-10-CM

## 2014-02-22 DIAGNOSIS — Z79899 Other long term (current) drug therapy: Secondary | ICD-10-CM

## 2014-02-22 DIAGNOSIS — I498 Other specified cardiac arrhythmias: Secondary | ICD-10-CM | POA: Diagnosis present

## 2014-02-22 DIAGNOSIS — F411 Generalized anxiety disorder: Secondary | ICD-10-CM

## 2014-02-22 DIAGNOSIS — Z87891 Personal history of nicotine dependence: Secondary | ICD-10-CM

## 2014-02-22 DIAGNOSIS — G893 Neoplasm related pain (acute) (chronic): Secondary | ICD-10-CM | POA: Diagnosis present

## 2014-02-22 DIAGNOSIS — R180 Malignant ascites: Secondary | ICD-10-CM | POA: Diagnosis present

## 2014-02-22 DIAGNOSIS — J9611 Chronic respiratory failure with hypoxia: Secondary | ICD-10-CM

## 2014-02-22 DIAGNOSIS — Z8269 Family history of other diseases of the musculoskeletal system and connective tissue: Secondary | ICD-10-CM

## 2014-02-22 DIAGNOSIS — Z66 Do not resuscitate: Secondary | ICD-10-CM | POA: Diagnosis not present

## 2014-02-22 DIAGNOSIS — R5381 Other malaise: Secondary | ICD-10-CM | POA: Diagnosis not present

## 2014-02-22 DIAGNOSIS — E039 Hypothyroidism, unspecified: Secondary | ICD-10-CM | POA: Diagnosis present

## 2014-02-22 DIAGNOSIS — K3189 Other diseases of stomach and duodenum: Secondary | ICD-10-CM | POA: Diagnosis present

## 2014-02-22 DIAGNOSIS — R109 Unspecified abdominal pain: Secondary | ICD-10-CM | POA: Diagnosis present

## 2014-02-22 DIAGNOSIS — C801 Malignant (primary) neoplasm, unspecified: Secondary | ICD-10-CM

## 2014-02-22 DIAGNOSIS — N179 Acute kidney failure, unspecified: Secondary | ICD-10-CM | POA: Diagnosis present

## 2014-02-22 DIAGNOSIS — C786 Secondary malignant neoplasm of retroperitoneum and peritoneum: Secondary | ICD-10-CM | POA: Diagnosis present

## 2014-02-22 DIAGNOSIS — Z885 Allergy status to narcotic agent status: Secondary | ICD-10-CM

## 2014-02-22 DIAGNOSIS — M7989 Other specified soft tissue disorders: Secondary | ICD-10-CM | POA: Diagnosis present

## 2014-02-22 DIAGNOSIS — C799 Secondary malignant neoplasm of unspecified site: Secondary | ICD-10-CM

## 2014-02-22 DIAGNOSIS — R532 Functional quadriplegia: Secondary | ICD-10-CM | POA: Diagnosis present

## 2014-02-22 DIAGNOSIS — R110 Nausea: Secondary | ICD-10-CM

## 2014-02-22 DIAGNOSIS — J438 Other emphysema: Secondary | ICD-10-CM | POA: Diagnosis present

## 2014-02-22 DIAGNOSIS — J910 Malignant pleural effusion: Secondary | ICD-10-CM | POA: Diagnosis present

## 2014-02-22 DIAGNOSIS — R1013 Epigastric pain: Secondary | ICD-10-CM

## 2014-02-22 DIAGNOSIS — K219 Gastro-esophageal reflux disease without esophagitis: Secondary | ICD-10-CM | POA: Diagnosis present

## 2014-02-22 DIAGNOSIS — C3490 Malignant neoplasm of unspecified part of unspecified bronchus or lung: Secondary | ICD-10-CM

## 2014-02-22 DIAGNOSIS — J449 Chronic obstructive pulmonary disease, unspecified: Secondary | ICD-10-CM | POA: Diagnosis present

## 2014-02-22 DIAGNOSIS — J439 Emphysema, unspecified: Secondary | ICD-10-CM

## 2014-02-22 DIAGNOSIS — R5383 Other fatigue: Secondary | ICD-10-CM

## 2014-02-22 DIAGNOSIS — R1084 Generalized abdominal pain: Secondary | ICD-10-CM

## 2014-02-22 DIAGNOSIS — R0902 Hypoxemia: Secondary | ICD-10-CM

## 2014-02-22 DIAGNOSIS — Z515 Encounter for palliative care: Secondary | ICD-10-CM | POA: Diagnosis not present

## 2014-02-22 LAB — URINALYSIS, ROUTINE W REFLEX MICROSCOPIC
Glucose, UA: NEGATIVE mg/dL
HGB URINE DIPSTICK: NEGATIVE
KETONES UR: 15 mg/dL — AB
NITRITE: NEGATIVE
PROTEIN: NEGATIVE mg/dL
Specific Gravity, Urine: 1.038 — ABNORMAL HIGH (ref 1.005–1.030)
UROBILINOGEN UA: 2 mg/dL — AB (ref 0.0–1.0)
pH: 5 (ref 5.0–8.0)

## 2014-02-22 LAB — LIPASE, BLOOD: LIPASE: 23 U/L (ref 11–59)

## 2014-02-22 LAB — CBC WITH DIFFERENTIAL/PLATELET
BASOS PCT: 0 % (ref 0–1)
Basophils Absolute: 0 10*3/uL (ref 0.0–0.1)
EOS ABS: 0 10*3/uL (ref 0.0–0.7)
Eosinophils Relative: 0 % (ref 0–5)
HEMATOCRIT: 40.6 % (ref 39.0–52.0)
Hemoglobin: 12.3 g/dL — ABNORMAL LOW (ref 13.0–17.0)
LYMPHS ABS: 0.9 10*3/uL (ref 0.7–4.0)
Lymphocytes Relative: 10 % — ABNORMAL LOW (ref 12–46)
MCH: 24.5 pg — AB (ref 26.0–34.0)
MCHC: 30.3 g/dL (ref 30.0–36.0)
MCV: 80.7 fL (ref 78.0–100.0)
MONO ABS: 0.6 10*3/uL (ref 0.1–1.0)
MONOS PCT: 6 % (ref 3–12)
NEUTROS PCT: 84 % — AB (ref 43–77)
Neutro Abs: 7.6 10*3/uL (ref 1.7–7.7)
Platelets: 203 10*3/uL (ref 150–400)
RBC: 5.03 MIL/uL (ref 4.22–5.81)
RDW: 17.9 % — ABNORMAL HIGH (ref 11.5–15.5)
WBC: 9 10*3/uL (ref 4.0–10.5)

## 2014-02-22 LAB — COMPREHENSIVE METABOLIC PANEL
ALK PHOS: 125 U/L — AB (ref 39–117)
ALT: 12 U/L (ref 0–53)
AST: 17 U/L (ref 0–37)
Albumin: 2.6 g/dL — ABNORMAL LOW (ref 3.5–5.2)
Anion gap: 17 — ABNORMAL HIGH (ref 5–15)
BILIRUBIN TOTAL: 0.4 mg/dL (ref 0.3–1.2)
BUN: 27 mg/dL — ABNORMAL HIGH (ref 6–23)
CHLORIDE: 97 meq/L (ref 96–112)
CO2: 25 mEq/L (ref 19–32)
Calcium: 9.3 mg/dL (ref 8.4–10.5)
Creatinine, Ser: 0.78 mg/dL (ref 0.50–1.35)
GFR calc non Af Amer: >90 mL/min (ref >90)
GLUCOSE: 147 mg/dL — AB (ref 70–99)
POTASSIUM: 4.4 meq/L (ref 3.7–5.3)
Sodium: 139 mEq/L (ref 137–147)
TOTAL PROTEIN: 7.8 g/dL (ref 6.0–8.3)

## 2014-02-22 LAB — URINE MICROSCOPIC-ADD ON

## 2014-02-22 LAB — I-STAT TROPONIN, ED: Troponin i, poc: 0 ng/mL (ref 0.00–0.08)

## 2014-02-22 MED ORDER — SODIUM CHLORIDE 0.9 % IV SOLN
1000.0000 mL | INTRAVENOUS | Status: DC
  Administered 2014-02-22: 1000 mL via INTRAVENOUS

## 2014-02-22 MED ORDER — BOOST PLUS PO LIQD
237.0000 mL | Freq: Three times a day (TID) | ORAL | Status: DC
  Administered 2014-02-23 – 2014-03-05 (×10): 237 mL via ORAL
  Filled 2014-02-22 (×33): qty 237

## 2014-02-22 MED ORDER — ENOXAPARIN SODIUM 40 MG/0.4ML ~~LOC~~ SOLN
40.0000 mg | SUBCUTANEOUS | Status: AC
  Administered 2014-02-22 – 2014-02-28 (×7): 40 mg via SUBCUTANEOUS
  Filled 2014-02-22 (×7): qty 0.4

## 2014-02-22 MED ORDER — HYDROMORPHONE HCL PF 1 MG/ML IJ SOLN
1.0000 mg | INTRAMUSCULAR | Status: AC | PRN
  Administered 2014-02-22 (×3): 1 mg via INTRAVENOUS
  Filled 2014-02-22 (×3): qty 1

## 2014-02-22 MED ORDER — HYDRALAZINE HCL 25 MG PO TABS
25.0000 mg | ORAL_TABLET | Freq: Three times a day (TID) | ORAL | Status: DC
  Administered 2014-02-22 – 2014-03-08 (×36): 25 mg via ORAL
  Filled 2014-02-22 (×45): qty 1

## 2014-02-22 MED ORDER — ADULT MULTIVITAMIN W/MINERALS CH
1.0000 | ORAL_TABLET | Freq: Every day | ORAL | Status: DC
Start: 2014-02-22 — End: 2014-03-08
  Administered 2014-02-22 – 2014-03-08 (×12): 1 via ORAL
  Filled 2014-02-22 (×15): qty 1

## 2014-02-22 MED ORDER — METOCLOPRAMIDE HCL 10 MG PO TABS
10.0000 mg | ORAL_TABLET | Freq: Three times a day (TID) | ORAL | Status: DC
  Administered 2014-02-22 – 2014-03-02 (×26): 10 mg via ORAL
  Filled 2014-02-22 (×34): qty 1

## 2014-02-22 MED ORDER — HYDROMORPHONE HCL PF 1 MG/ML IJ SOLN
1.0000 mg | Freq: Once | INTRAMUSCULAR | Status: AC
Start: 2014-02-22 — End: 2014-02-22
  Administered 2014-02-22: 1 mg via INTRAVENOUS
  Filled 2014-02-22: qty 1

## 2014-02-22 MED ORDER — ONDANSETRON HCL 4 MG/2ML IJ SOLN
4.0000 mg | Freq: Four times a day (QID) | INTRAMUSCULAR | Status: DC | PRN
  Administered 2014-02-22 – 2014-03-08 (×15): 4 mg via INTRAVENOUS
  Filled 2014-02-22 (×15): qty 2

## 2014-02-22 MED ORDER — OXYCODONE HCL ER 15 MG PO T12A
30.0000 mg | EXTENDED_RELEASE_TABLET | Freq: Two times a day (BID) | ORAL | Status: DC
  Administered 2014-02-22 – 2014-02-23 (×2): 30 mg via ORAL
  Filled 2014-02-22 (×2): qty 2

## 2014-02-22 MED ORDER — ACETAMINOPHEN 325 MG PO TABS: 650.0000 mg | ORAL_TABLET | Freq: Four times a day (QID) | ORAL | Status: DC | PRN

## 2014-02-22 MED ORDER — HYDROMORPHONE HCL PF 1 MG/ML IJ SOLN
1.0000 mg | INTRAMUSCULAR | Status: DC | PRN
  Administered 2014-02-22 – 2014-02-24 (×20): 1 mg via INTRAVENOUS
  Filled 2014-02-22 (×20): qty 1

## 2014-02-22 MED ORDER — ONDANSETRON HCL 4 MG PO TABS
4.0000 mg | ORAL_TABLET | Freq: Four times a day (QID) | ORAL | Status: DC | PRN
Start: 2014-02-22 — End: 2014-03-04

## 2014-02-22 MED ORDER — SENNOSIDES-DOCUSATE SODIUM 8.6-50 MG PO TABS
1.0000 | ORAL_TABLET | Freq: Every day | ORAL | Status: DC
  Administered 2014-02-23 – 2014-03-08 (×12): 1 via ORAL
  Filled 2014-02-22 (×14): qty 1

## 2014-02-22 MED ORDER — ALBUTEROL SULFATE (2.5 MG/3ML) 0.083% IN NEBU
2.5000 mg | INHALATION_SOLUTION | RESPIRATORY_TRACT | Status: DC | PRN
  Administered 2014-02-24 – 2014-03-08 (×18): 2.5 mg via RESPIRATORY_TRACT
  Filled 2014-02-22 (×23): qty 3

## 2014-02-22 MED ORDER — SODIUM CHLORIDE 0.9 % IV SOLN
INTRAVENOUS | Status: DC
  Administered 2014-02-22 – 2014-03-02 (×9): via INTRAVENOUS
  Administered 2014-03-03 – 2014-03-04 (×2): 1000 mL via INTRAVENOUS
  Administered 2014-03-06: 23:00:00 via INTRAVENOUS

## 2014-02-22 MED ORDER — ONDANSETRON HCL 4 MG/2ML IJ SOLN
4.0000 mg | Freq: Once | INTRAMUSCULAR | Status: AC
  Administered 2014-02-22: 4 mg via INTRAVENOUS
  Filled 2014-02-22: qty 2

## 2014-02-22 MED ORDER — MAGNESIUM CITRATE PO SOLN: 1.0000 | Freq: Once | ORAL | Status: AC | PRN

## 2014-02-22 MED ORDER — BUDESONIDE-FORMOTEROL FUMARATE 160-4.5 MCG/ACT IN AERO
1.0000 | INHALATION_SPRAY | Freq: Every day | RESPIRATORY_TRACT | Status: DC
  Administered 2014-02-22 – 2014-03-08 (×13): 1 via RESPIRATORY_TRACT
  Filled 2014-02-22 (×2): qty 6

## 2014-02-22 MED ORDER — SODIUM CHLORIDE 0.9 % IV SOLN
1000.0000 mL | Freq: Once | INTRAVENOUS | Status: AC
  Administered 2014-02-22: 1000 mL via INTRAVENOUS

## 2014-02-22 MED ORDER — ACETAMINOPHEN 650 MG RE SUPP: 650.0000 mg | Freq: Four times a day (QID) | RECTAL | Status: DC | PRN

## 2014-02-22 MED ORDER — ALUM & MAG HYDROXIDE-SIMETH 200-200-20 MG/5ML PO SUSP: 30.0000 mL | Freq: Four times a day (QID) | ORAL | Status: DC | PRN

## 2014-02-22 MED ORDER — OXYCODONE HCL 5 MG PO TABS
5.0000 mg | ORAL_TABLET | ORAL | Status: DC | PRN
  Administered 2014-02-24: 10 mg via ORAL
  Filled 2014-02-22: qty 2

## 2014-02-22 MED ORDER — LABETALOL HCL 100 MG PO TABS
100.0000 mg | ORAL_TABLET | Freq: Two times a day (BID) | ORAL | Status: DC
  Administered 2014-02-22 – 2014-03-01 (×14): 100 mg via ORAL
  Filled 2014-02-22 (×17): qty 1

## 2014-02-22 MED ORDER — POLYETHYLENE GLYCOL 3350 17 G PO PACK
17.0000 g | PACK | Freq: Every day | ORAL | Status: DC | PRN
  Filled 2014-02-22: qty 1

## 2014-02-22 MED ORDER — PANTOPRAZOLE SODIUM 40 MG PO TBEC
40.0000 mg | DELAYED_RELEASE_TABLET | Freq: Every day | ORAL | Status: DC
  Administered 2014-02-23 – 2014-03-01 (×7): 40 mg via ORAL
  Filled 2014-02-22 (×8): qty 1

## 2014-02-22 NOTE — Progress Notes (Signed)
  CARE MANAGEMENT ED NOTE 02/22/2014  Patient:  Geoffrey Richardson   Account Number:  [id number]  Date Initiated:  02/22/2014  Documentation initiated by:  Livia Snellen  Subjective/Objective Assessment:   Patient presents to Ed with shortness of breath and abdomonal pain     Subjective/Objective Assessment Detail:   Patient with pmhx of small cell carcinoma of the lung     Action/Plan:   Action/Plan Detail:   Anticipated DC Date:       Status Recommendation to Physician:   Result of Recommendation:    Other ED Services  Consult Working [place]  CM consult  Other    Choice offered to / List presented to:            Status of service:  Completed, signed off  ED Comments:   ED Comments Detail:  [facility] consulted for home health needs.  EDCM spoke to patient at bedside.  Patient reports he lives at home with his caretaker Geoffrey Richardson.  Patient reports Geoffrey Richardson is with him continuously for the majority of the time. Patient has a walker, cane, and a wheelchair at home. Patient reports for this last week his caregiver has had to assist him with ADL's, before then he was able to complete ADL's on his own.  Patient reports he is unable to walk now because he is too weak.  Patient reports he was supposed to receive home health RN with [facility] but no one contacted him.  EDCM placed phone call to Geoffrey Richardson for [facility] and left message regarding hom health services at 1305pm.  Patient confirms he wears oxygen at home and receives his oxygen from Geoffrey Richardson.  Patient confirms he has Medicaid insurance.  Patient has not had a doctor visit in between admissions and did not receive a follow up call at home.  Patient confirms his pcp is Geoffrey Richardson and his oncology doctor is Geoffrey Richardson.  No further EDCM needs at this time.

## 2014-02-22 NOTE — ED Notes (Signed)
He remains in no distress and is transported to u/s at this time by [facility].

## 2014-02-22 NOTE — ED Notes (Signed)
Pt from home. Reports sob for past several days along with abd distention. LBM yesterday. Pt has Lung cancer on R side. Lung sounds diminished throughout. Scheduled to restart chemo and radiation, but has not started yet. Pt on 2L [place] at home. Also has new bilateral pedal edema.

## 2014-02-22 NOTE — ED Notes (Signed)
I just gave report to [place], RN on 3rd floor--will transport shortly.

## 2014-02-22 NOTE — ED Provider Notes (Signed)
CSN: [id number]     Arrival date & time 02/22/14  0857 History   First MD Initiated Contact with Patient 02/22/14 [phone]     Chief Complaint  Patient presents with  . Shortness of Breath    HPI The patient presents to emergency room with complaints of shortness of breath and abdominal pain. 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 The patient was most recently in the hospital the end of June for the same problem. The patient is not a candidate for any surgical intervention. He has not been receiving chemotherapy because of concerns about malnutrition. Positive care was recommended however the patient was not interested in palliative treatment. The patient was supposed to followup with oncology as an outpatient. He has not been in to see them since his discharge. The patient did run out of his pain medications. While he was at home today he had the sudden onset of his abdomen hurting. The pain was sharp and it radiated to his back. He also started to feel more short of breath when the pain started. He has not had any recent cough. He has not had any fevers. He's not had any vomiting or diarrhea. He is chronically on oxygen at home.  Next oncology appointment is in August. Past Medical History  Diagnosis Date  . Tobacco abuse     Began age 53  . Pleural effusion on right   . COPD (chronic obstructive pulmonary disease)   . Hypertension     pt states it fluctuates but not on any medications  . Emphysema   . Shortness of breath     lying/sitting/exertion  . Headache(784.0)     occasionally  . GERD (gastroesophageal reflux disease)     takes Protonix daily  . Nocturia   . Hypothyroidism     but not on medication at the present time  . Insomnia   . Lung cancer 02/04/2013   Past Surgical History  Procedure Laterality Date  . Appendectomy      as a child  . Video  assisted thoracoscopy (vats) w/talc pleuadesis Right 01/14/2013    Procedure: VIDEO ASSISTED THORACOSCOPY (VATS) W/TALC PLEUADESIS;  Surgeon: Ivin Poot, MD;  Location: [place];  Service: Thoracic;  Laterality: Right;  . Chest tube insertion Right 01/14/2013    Procedure: INSERTION PLEURAL DRAINAGE CATHETER;  Surgeon: Ivin Poot, MD;  Location: [place];  Service: Thoracic;  Laterality: Right;  . Removal of pleural drainage catheter N/A 04/22/2013    Procedure: MINOR REMOVAL OF PLEURAL DRAINAGE CATHETER;  Surgeon: Ivin Poot, MD;  Location: [place];  Service: Thoracic;  Laterality: N/A;  . Esophagogastroduodenoscopy N/A 01/27/2014    Procedure: ESOPHAGOGASTRODUODENOSCOPY (EGD);  Surgeon: Beryle Beams, MD;  Location[facility] ENDOSCOPY;  Service: Endoscopy;  Laterality: N/A;   Family History  Problem Relation Age of Onset  . ALS Mother     Deceased 77  . Other Father     Deceased from [facility], hit by drunk driver.  Hx of prostate issues   History  Substance Use Topics  . Smoking status: Former Smoker -- 2.00 packs/day for 36 years    Types: Cigarettes  . Smokeless tobacco: Never Used     Comment: no smoking since 12/24/12 states had smoked since age 36  . Alcohol Use: Yes     Comment: occasionally  Review of Systems  All other systems reviewed and are negative.     Allergies  Morphine and related  Home Medications   Prior to Admission medications   Medication Sig Start Date End Date Taking? Authorizing Provider  acetaminophen (TYLENOL ARTHRITIS PAIN) 650 MG CR tablet Take 325-650 mg by mouth every 8 (eight) hours as needed for pain.   Yes Historical Provider, MD  albuterol (PROVENTIL HFA;VENTOLIN HFA) 108 (90 BASE) MCG/ACT inhaler Inhale 2 puffs into the lungs every 4 (four) hours as needed for wheezing or shortness of breath. 02/03/14  Yes Debbe Odea, MD  budesonide-formoterol (SYMBICORT) 160-4.5 MCG/ACT inhaler Inhale 1 puff into the lungs daily.   Yes Historical Provider, MD   docusate sodium (COLACE) 100 MG capsule Take 100 mg by mouth daily.   Yes Historical Provider, MD  famotidine (PEPCID) 20 MG tablet Take 1 tablet (20 mg total) by mouth 2 (two) times daily. 02/03/14  Yes Debbe Odea, MD  hydrALAZINE (APRESOLINE) 25 MG tablet Take 1 tablet (25 mg total) by mouth every 8 (eight) hours. 02/03/14  Yes Debbe Odea, MD  HYDROcodone-acetaminophen (NORCO/VICODIN) 5-325 MG per tablet Take 1-2 tablets by mouth every 6 (six) hours as needed. 02/07/14  Yes Wyatt Portela, MD  HYDROmorphone (DILAUDID) 2 MG tablet Take 0.5-1.5 tablets (1-3 mg total) by mouth every 3 (three) hours as needed for severe pain. 02/03/14  Yes Debbe Odea, MD  labetalol (NORMODYNE) 100 MG tablet Take 1 tablet (100 mg total) by mouth 2 (two) times daily. 02/03/14  Yes Debbe Odea, MD  lactose free nutrition (BOOST PLUS) LIQD Take 237 mLs by mouth 3 (three) times daily. 02/03/14  Yes Debbe Odea, MD  metoCLOPramide (REGLAN) 10 MG tablet Take 1 tablet (10 mg total) by mouth 4 (four) times daily -  before meals and at bedtime. 02/03/14  Yes Debbe Odea, MD  Multiple Vitamin (MULTIVITAMIN WITH MINERALS) TABS tablet Take 1 tablet by mouth daily. 02/03/14  Yes Debbe Odea, MD  ondansetron (ZOFRAN ODT) 4 MG disintegrating tablet Take 1-2 tablets (4-8 mg total) by mouth every 8 (eight) hours as needed for nausea or vomiting. 02/03/14  Yes Debbe Odea, MD  OVER THE COUNTER MEDICATION at bedtime. Activia Yogurt   Yes Historical Provider, MD  OxyCODONE HCl ER 30 MG T12A Take 30 mg by mouth every 12 (twelve) hours. 02/03/14  Yes Debbe Odea, MD  pantoprazole (PROTONIX) 40 MG tablet Take 1 tablet (40 mg total) by mouth daily. 02/03/14  Yes Debbe Odea, MD  sennosides-docusate sodium (SENOKOT-S) 8.6-50 MG tablet Take 1 tablet by mouth daily with breakfast.   Yes Historical Provider, MD  Simethicone (GAS-X MAXIMUM STRENGTH PO) Take 1-2 tablets by mouth 4 (four) times daily as needed (stomach pains).   Yes Historical  Provider, MD   BP 133/109  Pulse 127  Temp(Src) 97.7 F (36.5 C) (Oral)  Resp 21  SpO2 100% Physical Exam  Nursing note and vitals reviewed. Constitutional: He appears well-developed and well-nourished. No distress.  HENT:  Head: Normocephalic and atraumatic.  Right Ear: External ear normal.  Left Ear: External ear normal.  Eyes: Conjunctivae are normal. Right eye exhibits no discharge. Left eye exhibits no discharge. No scleral icterus.  Neck: Neck supple. No tracheal deviation present.  Cardiovascular: Normal rate, regular rhythm and intact distal pulses.   Pulmonary/Chest: Effort normal. No stridor. No respiratory distress. He has decreased breath sounds in the right upper field, the right middle field and the right lower field. He has no  wheezes. He has no rales.  Gynecomastia bilaterally  Abdominal: Soft. He exhibits distension. Bowel sounds are decreased. There is tenderness. There is no rebound and no guarding.  Musculoskeletal: He exhibits no edema and no tenderness.  Neurological: He is alert. He has normal strength. No cranial nerve deficit (no facial droop, extraocular movements intact, no slurred speech) or sensory deficit. He exhibits normal muscle tone. He displays no seizure activity. Coordination normal.  Skin: Skin is warm and dry. No rash noted.  Psychiatric: He has a normal mood and affect.    ED Course  Procedures (including critical care time) Labs Review Labs Reviewed  CBC WITH DIFFERENTIAL - Abnormal; Notable for the following:    Hemoglobin 12.3 (*)    MCH 24.5 (*)    RDW 17.9 (*)    Neutrophils Relative % 84 (*)    Lymphocytes Relative 10 (*)    All other components within normal limits  COMPREHENSIVE METABOLIC PANEL - Abnormal; Notable for the following:    Glucose, Bld 147 (*)    BUN 27 (*)    Albumin 2.6 (*)    Alkaline Phosphatase 125 (*)    Anion gap 17 (*)    All other components within normal limits  LIPASE, BLOOD  URINALYSIS, ROUTINE W  REFLEX MICROSCOPIC  I-STAT TROPOININ, ED    Imaging Review Dg Abd Acute W/chest  02/22/2014   CLINICAL DATA:  Shortness of breath with upper abdominal pain. History of lung cancer.  EXAM: ACUTE ABDOMEN SERIES (ABDOMEN 2 VIEW & CHEST 1 VIEW)  COMPARISON:  Abdominal radiographs 01/30/2014 and 01/29/2014. Chest radiographs 01/18/2014 and CT 01/25/2014.  FINDINGS: There is stable chronic volume loss in the right hemithorax with a chronic loculated pleural effusion and partial right lung collapse. Right apical thickening is stable. The left lung is clear. The heart size and mediastinal contours are stable.  Abdominal radiographs demonstrate moderate mid small bowel distension, slightly increased from prior studies. There is some contrast material within the colon IT which is relatively decompressed. Peripheral abdominal density suggests residual ascites. There is no evidence of pneumoperitoneum.  IMPRESSION: 1. Stable chronic findings in the right hemithorax. No acute cardiopulmonary process. 2. 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   Electronically Signed   By: Camie Patience M.D.   On: 02/22/2014 10:04     EKG Interpretation   Date/Time:  Wednesday February 22 2014 09:05:56 EDT Ventricular Rate:  133 PR Interval:  128 QRS Duration: 85 QT Interval:  315 QTC Calculation: 468 R Axis:   81 Text Interpretation:  Sinus tachycardia Baseline wander in lead(s) I III  aVR aVL sinus tachycardia rate increased since last tracing Confirmed by  Raine Elsass  MD-J, Dawnn Nam ([id number]) on 02/22/2014 9:17:02 AM     Medications  0.9 %  sodium chloride infusion (1,000 mLs Intravenous New Bag/Given 02/22/14 0926)    Followed by  0.9 %  sodium chloride infusion (not administered)  HYDROmorphone (DILAUDID) injection 1 mg (1 mg Intravenous Given 02/22/14 0926)  ondansetron (ZOFRAN) injection 4 mg (4 mg Intravenous Given 02/22/14 0926)    1045  Pt still having pain.  Feels like his stomach swelling is still affecting his breathing.  MDM   Final diagnoses:  Metastatic lung cancer (metastasis from lung to other site), unspecified laterality  Malignant pleural effusion  Malignant ascites    The patient was recently in the hospital for similar issues. He continues to have trouble  with persistent pain and difficulty breathing associated with his metastatic lung cancer. A lung cancer is caused malignant pleural effusion as well as malignant ascites. The patient has not started any chemotherapy. He has also run out of his pain medications. I'm not sure if he would benefit from palliative thoracentesis and paracentesis. I will consult with the hospitalist regarding admission for pain management and consultation with oncology regarding further treatment.   Dorie Rank, MD 02/22/14 1101

## 2014-02-22 NOTE — ED Notes (Signed)
Bed: RESB Expected date:  Expected time:  Means of arrival:  Comments: CA pt SOB

## 2014-02-22 NOTE — Procedures (Signed)
Successful US guided paracentesis from RLQ.  Yielded 4.1L of clear dark yellow fluid.  No immediate complications.  Pt tolerated well.   Specimen was sent for labs.   Limited US of the right chest finds multiloculated effusion with  Thickened pleura or rind. This is not amenable to thoracentesis. Pt has history of (R)sided Pleurx placement which had to be removed as effusion became loculated.    Ascencion Dike PA-C 02/22/2014 3:11 PM

## 2014-02-22 NOTE — H&P (Signed)
History and Physical:    Geoffrey Richardson XBW:[id number] DOB: [birth year] DOA: 02/22/2014  Referring physician: Dr. Dorie Rank PCP: Philis Fendt, MD   Chief Complaint: Abdominal pain, shortness of breath.  History of Present Illness:   Geoffrey Richardson is an 53 y.o. male with a PMH of non-small cell lung cancer with recurrent malignant pleural effusion status post pleural catheter insertion 01/15/2013 (subsequently removed after became infected) and chemotherapy with carboplatin and Taxotere on 02/17/13 status post 6 cycles which completed on 06/02/13, recent hospital admission 01/25/14-02/03/14 for evaluation of abdominal pain and early satiety. CT scan of the chest, abdomen and pelvis done during that admission showed peritoneal caking and ascites. He had a 1.4 L paracentesis done 01/26/14. Plan was to followup with oncology as an outpatient for consideration of further chemotherapy if his performance status/nutrition improved. Of note, he was seen by palliative care during his previous hospital stay and declined this. The patient is accompanied by his significant other reports that the pain persisted after his discharge, gradually worsening and now is severe after running out of OxyContin. States that Dr. [name] gave him a prescription for Vicodin which did not help. His pain is accompanied by the sensation of bloating and shortness of breath. Pain is currently in the abdomen and upper back. CT scan of the abdomen and pelvis confirm worsening ascites and progression of peritoneal caking.  ROS:   Constitutional: No fever, + chills;  Appetite diminished; + weight loss, + fatigue.  HEENT: No blurry vision, no diplopia, no pharyngitis, no dysphagia CV: No chest pain, no palpitations, no PND, no orthopnea, + edema.  Resp: + SOB, no cough, + pleuritic pain. GI: + nausea, no vomiting, no diarrhea, no melena, no hematochezia, + constipation, + bloating and abdominal pain.  GU: No dysuria, no hematuria, no  frequency, no urgency, +hesitancy. MSK: no myalgias, + upper back arthralgias.  Neuro:  No headache, no focal neurological deficits, no history of seizures.  Psych: No depression, + anxiety.  Endo: No heat intolerance, no cold intolerance, no polyuria, no polydipsia  Skin: No rashes, no skin lesions.  Heme: No easy bruising.  Travel history: No recent travel.   Past Medical History:   Past Medical History  Diagnosis Date  . Tobacco abuse     Began age 70  . Pleural effusion on right   . COPD (chronic obstructive pulmonary disease)   . Hypertension     pt states it fluctuates but not on any medications  . Emphysema   . Shortness of breath     lying/sitting/exertion  . Headache(784.0)     occasionally  . GERD (gastroesophageal reflux disease)     takes Protonix daily  . Nocturia   . Hypothyroidism     but not on medication at the present time  . Insomnia   . Lung cancer 02/04/2013    Past Surgical History:   Past Surgical History  Procedure Laterality Date  . Appendectomy      as a child  . Video assisted thoracoscopy (vats) w/talc pleuadesis Right 01/14/2013    Procedure: VIDEO ASSISTED THORACOSCOPY (VATS) W/TALC PLEUADESIS;  Surgeon: Ivin Poot, MD;  Location: [place];  Service: Thoracic;  Laterality: Right;  . Chest tube insertion Right 01/14/2013    Procedure: INSERTION PLEURAL DRAINAGE CATHETER;  Surgeon: Ivin Poot, MD;  [facility];  Service: Thoracic;  Laterality: Right;  . Removal of pleural drainage catheter N/A 04/22/2013    Procedure: MINOR REMOVAL OF  PLEURAL DRAINAGE CATHETER;  Surgeon: Ivin Poot, MD;  Location: [facility] OR;  Service: Thoracic;  Laterality: N/A;  . Esophagogastroduodenoscopy N/A 01/27/2014    Procedure: ESOPHAGOGASTRODUODENOSCOPY (EGD);  Surgeon: Beryle Beams, MD;  Location: [facility] ENDOSCOPY;  Service: Endoscopy;  Laterality: N/A;    Social History:   History   Social History  . Marital Status: Married    Spouse Name: N/A    Number of  Children: 2  . Years of Education: N/A   Occupational History  . kitchen help     disabled   Social History Main Topics  . Smoking status: Former Smoker -- 2.00 packs/day for 36 years    Types: Cigarettes  . Smokeless tobacco: Never Used     Comment: no smoking since 12/24/12 states had smoked since age 81  . Alcohol Use: Yes     Comment: occasionally  . Drug Use: No  . Sexual Activity: Yes   Other Topics Concern  . 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      Has 1 daughter who is 54 and plans to join the [employer] post graduation from [employer] in 2015.       Lives with [name].      Ambulates with walker.    Family history:   Family History  Problem Relation Age of Onset  . ALS Mother     Deceased 56  . Other Father     Deceased from [facility], hit by drunk driver.  Hx of prostate issues    Allergies   Morphine and related  Current Medications:   Prior to Admission medications   Medication Sig Start Date End Date Taking? Authorizing Provider  acetaminophen (TYLENOL ARTHRITIS PAIN) 650 MG CR tablet Take 325-650 mg by mouth every 8 (eight) hours as needed for pain.   Yes Historical Provider, MD  albuterol (PROVENTIL HFA;VENTOLIN HFA) 108 (90 BASE) MCG/ACT inhaler Inhale 2 puffs into the lungs every 4 (four) hours as needed for wheezing or shortness of breath. 02/03/14  Yes Debbe Odea, MD  budesonide-formoterol (SYMBICORT) 160-4.5 MCG/ACT inhaler Inhale 1 puff into the lungs daily.   Yes Historical Provider, MD  docusate sodium (COLACE) 100 MG capsule Take 100 mg by mouth daily.   Yes Historical Provider, MD  famotidine (PEPCID) 20 MG tablet Take 1 tablet (20 mg total) by mouth 2 (two) times daily. 02/03/14  Yes Debbe Odea, MD  hydrALAZINE (APRESOLINE) 25 MG tablet Take 1 tablet (25 mg total) by mouth every 8 (eight) hours. 02/03/14  Yes Debbe Odea, MD  HYDROcodone-acetaminophen (NORCO/VICODIN) 5-325 MG per tablet Take  1-2 tablets by mouth every 6 (six) hours as needed. 02/07/14  Yes Wyatt Portela, MD  HYDROmorphone (DILAUDID) 2 MG tablet Take 0.5-1.5 tablets (1-3 mg total) by mouth every 3 (three) hours as needed for severe pain. 02/03/14  Yes Debbe Odea, MD  labetalol (NORMODYNE) 100 MG tablet Take 1 tablet (100 mg total) by mouth 2 (two) times daily. 02/03/14  Yes Debbe Odea, MD  lactose free nutrition (BOOST PLUS) LIQD Take 237 mLs by mouth 3 (three) times daily. 02/03/14  Yes Debbe Odea, MD  metoCLOPramide (REGLAN) 10 MG tablet Take 1 tablet (10 mg total) by mouth 4 (four) times daily -  before meals and at bedtime. 02/03/14  Yes Debbe Odea, MD  Multiple Vitamin (MULTIVITAMIN WITH MINERALS) TABS tablet Take 1 tablet by mouth daily. 02/03/14  Yes Saima Rizwan,  MD  ondansetron (ZOFRAN ODT) 4 MG disintegrating tablet Take 1-2 tablets (4-8 mg total) by mouth every 8 (eight) hours as needed for nausea or vomiting. 02/03/14  Yes Debbe Odea, MD  OVER THE COUNTER MEDICATION at bedtime. Activia Yogurt   Yes Historical Provider, MD  OxyCODONE HCl ER 30 MG T12A Take 30 mg by mouth every 12 (twelve) hours. 02/03/14  Yes Debbe Odea, MD  pantoprazole (PROTONIX) 40 MG tablet Take 1 tablet (40 mg total) by mouth daily. 02/03/14  Yes Debbe Odea, MD  sennosides-docusate sodium (SENOKOT-S) 8.6-50 MG tablet Take 1 tablet by mouth daily with breakfast.   Yes Historical Provider, MD  Simethicone (GAS-X MAXIMUM STRENGTH PO) Take 1-2 tablets by mouth 4 (four) times daily as needed (stomach pains).   Yes Historical Provider, MD    Physical Exam:   Filed Vitals:   02/22/14 1457 02/22/14 1529 02/22/14 1552 02/22/14 1634  BP: 133/102 141/105  145/113  Pulse:  127 125 128  Temp:    97.7 F (36.5 C)  TempSrc:    Oral  Resp:  22  22  Height:    6' (1.829 m)  Weight:    80.3 kg (177 lb 0.5 oz)  SpO2:  100% 100% 98%     Physical Exam: Blood pressure 145/113, pulse 128, temperature 97.7 F (36.5 C), temperature source  Oral, resp. rate 22, height 6' (1.829 m), weight 80.3 kg (177 lb 0.5 oz), SpO2 98.00%. Gen: Moderate distress secondary to pain. Head: Normocephalic, atraumatic. Eyes: PERRL, EOMI, sclerae nonicteric. Mouth: Oropharynx clear. Neck: Supple, no thyromegaly, no lymphadenopathy, no jugular venous distention. Chest: Lungs are diminished in the bases. CV: Heart sounds are tachycardic. No murmurs, rubs, or gallops. Abdomen: Massively distended and firm, diminished bowel sounds. Extremities: Extremities with 2+ pitting edema. Skin: Warm and dry. Neuro: Restless but appears oriented; cranial nerves II through XII grossly intact. Psych: Mood and affect depressed.   Data Review:    Labs: Basic Metabolic Panel:  Recent Labs Lab 02/22/14 0922  NA 139  K 4.4  CL 97  CO2 25  GLUCOSE 147*  BUN 27*  CREATININE 0.78  CALCIUM 9.3   Liver Function Tests:  Recent Labs Lab 02/22/14 0922  AST 17  ALT 12  ALKPHOS 125*  BILITOT 0.4  PROT 7.8  ALBUMIN 2.6*    Recent Labs Lab 02/22/14 0922  LIPASE 23   CBC:  Recent Labs Lab 02/22/14 0922  WBC 9.0  NEUTROABS 7.6  HGB 12.3*  HCT 40.6  MCV 80.7  PLT 203    Radiographic Studies: US Paracentesis  02/22/2014   CLINICAL DATA:  Metastatic lung cancer with known malignant ascites. Current complaint of abdominal distention and pain. Request diagnostic and therapeutic paracentesis.  EXAM: ULTRASOUND GUIDED PARACENTESIS  COMPARISON:  Previous paracentesis 01/26/2014  PROCEDURE: An ultrasound guided paracentesis was thoroughly discussed with the patient and questions answered. The benefits, risks, alternatives and complications were also discussed. The patient understands and wishes to proceed with the procedure. Written consent was obtained.  Ultrasound was performed to localize and mark an adequate pocket of fluid in the right lower quadrant of the abdomen. The area was then prepped and draped in the normal sterile fashion. 1% Lidocaine  was used for local anesthesia. Under ultrasound guidance a 19 gauge Yueh catheter was introduced. Paracentesis was performed. The catheter was removed and a dressing applied.  COMPLICATIONS: None immediate  FINDINGS: A total of approximately 4.1 L of dark yellow fluid was removed. A  fluid sample was sent for laboratory analysis.  IMPRESSION: Successful ultrasound guided paracentesis yielding 4.1 L of ascites.  Read by:  Ascencion Dike, PA-C   Electronically Signed   By: Markus Daft M.D.   On: 02/22/2014 15:18   Dg Abd Acute W/chest  02/22/2014   CLINICAL DATA:  Shortness of breath with upper abdominal pain. History of lung cancer.  EXAM: ACUTE ABDOMEN SERIES (ABDOMEN 2 VIEW & CHEST 1 VIEW)  COMPARISON:  Abdominal radiographs 01/30/2014 and 01/29/2014. Chest radiographs 01/18/2014 and CT 01/25/2014.  FINDINGS: There is stable chronic volume loss in the right hemithorax with a chronic loculated pleural effusion and partial right lung collapse. Right apical thickening is stable. The left lung is clear. The heart size and mediastinal contours are stable.  Abdominal radiographs demonstrate moderate mid small bowel distension, slightly increased from prior studies. There is some contrast material within the colon IT which is relatively decompressed. Peripheral abdominal density suggests residual ascites. There is no evidence of pneumoperitoneum.  IMPRESSION: 1. Stable chronic findings in the right hemithorax. No acute cardiopulmonary process. 2. 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   Electronically Signed   By: Camie Patience M.D.   On: 02/22/2014 10:04     Assessment/Plan:   Principal Problem:   Cancer associated pain secondary to malignant ascites and peritoneal implants  We'll need to reconsult palliative care for a more effective outpatient pain management strategy.  Paracentesis done to address tense  ascites. 4.1 L removed.  Resume OxyContin and oxycodone 5-10 mg every 4 hours for breakthrough pain. Dilaudid-HP for severe pain.  Active Problems:   Benign essential hypertension  Continue hydralazine and labetalol.    COPD (chronic obstructive pulmonary disease)/chronic respiratory failure with hypoxia  Continue Symbicort. Continue bronchodilators as needed.  Provide supplemental oxygen to keep saturations greater than 92%.    Severe malnutrition  Secondary to cancer related cachexia. Boost nutritional supplements ordered.    Malignant pleural effusion  Effusion is loculated and cannot be drained per interventional radiology.    Metastatic Lung cancer  We'll notify Dr. [name] of the patient's admission.  DVT prophylaxis  Lovenox ordered.  Code Status: Full. Family Communication: [name] (close friend): [phone] Disposition Plan: Home when stable.  Time spent: One hour.  RAMA,CHRISTINA [facility] Pager [phone] Cell: [phone]   If 7PM-7AM, please contact night-coverage [email or web address] Password [facility] 02/22/2014, 5:39 PM    **Disclaimer: This note was dictated with voice recognition software. Similar sounding words can inadvertently be transcribed and this note may contain transcription errors which may not have been corrected upon publication of note.**

## 2014-02-22 NOTE — Progress Notes (Signed)
02/22/2014 A. Geoffrey Richardson RNCM 1337pm EDCM received call from Geoffrey Richardson of [facility] reporting that Geoffrey Richardson has tried numerous times to contact patient without success and was closed out.  Contact phone number Geoffrey Richardson had on record was [phone]. Geoffrey Richardson reports [facility] will provide services for patient if they are able to contact him. EDCM spoke to patient at bedside.  Contact phone  number for patient is his caretaker Geoffrey Richardson who confirms her phone number is [phone] not [phone].  Geoffrey Richardson text correct phone number to [place] of [facility].  No further EDCM needs at this time.

## 2014-02-22 NOTE — ED Notes (Signed)
Patient transported to X-ray 

## 2014-02-22 NOTE — ED Notes (Signed)
He states his right thoracic pain is "gone; but I feel discomfort, like I'm tight in my belly".  I check with u/s at this time, and they give tentative time for paracentesis 30-40min.  He remains in no distress.

## 2014-02-22 NOTE — ED Notes (Signed)
He returns from u/s at this time and reports increase in right thoracic pain, inspite of med. Given during u/s procedure.  IV pain med. Given.

## 2014-02-23 DIAGNOSIS — R180 Malignant ascites: Secondary | ICD-10-CM

## 2014-02-23 DIAGNOSIS — C3490 Malignant neoplasm of unspecified part of unspecified bronchus or lung: Secondary | ICD-10-CM

## 2014-02-23 DIAGNOSIS — C786 Secondary malignant neoplasm of retroperitoneum and peritoneum: Principal | ICD-10-CM

## 2014-02-23 DIAGNOSIS — R109 Unspecified abdominal pain: Secondary | ICD-10-CM

## 2014-02-23 DIAGNOSIS — G893 Neoplasm related pain (acute) (chronic): Secondary | ICD-10-CM

## 2014-02-23 DIAGNOSIS — R1013 Epigastric pain: Secondary | ICD-10-CM

## 2014-02-23 LAB — BASIC METABOLIC PANEL
Anion gap: 10 (ref 5–15)
BUN: 16 mg/dL (ref 6–23)
CO2: 27 meq/L (ref 19–32)
Calcium: 8.7 mg/dL (ref 8.4–10.5)
Chloride: 103 mEq/L (ref 96–112)
Creatinine, Ser: 0.64 mg/dL (ref 0.50–1.35)
GFR calc Af Amer: >90 mL/min (ref >90)
GLUCOSE: 105 mg/dL — AB (ref 70–99)
POTASSIUM: 4.5 meq/L (ref 3.7–5.3)
SODIUM: 140 meq/L (ref 137–147)

## 2014-02-23 LAB — BODY FLUID CELL COUNT WITH DIFFERENTIAL
Lymphs, Fluid: 49 %
Monocyte-Macrophage-Serous Fluid: 47 % — ABNORMAL LOW (ref 50–90)
NEUTROPHIL FLUID: 4 % (ref 0–25)
WBC FLUID: 658 uL (ref 0–1000)

## 2014-02-23 LAB — PATHOLOGIST SMEAR REVIEW

## 2014-02-23 LAB — PROTEIN, BODY FLUID: Total protein, fluid: 5.9 g/dL

## 2014-02-23 MED ORDER — OXYCODONE HCL ER 15 MG PO T12A
45.0000 mg | EXTENDED_RELEASE_TABLET | Freq: Two times a day (BID) | ORAL | Status: DC
  Administered 2014-02-23 – 2014-02-26 (×7): 45 mg via ORAL
  Filled 2014-02-23 (×7): qty 3

## 2014-02-23 NOTE — Progress Notes (Signed)
INITIAL NUTRITION ASSESSMENT  DOCUMENTATION CODES Per approved criteria  -Severe malnutrition in the context of chronic illness  Pt meets criteria for severe MALNUTRITION in the context of chronic illness as evidenced by PO intake < 75% for > one month, 6.8% body weight loss in one month.   INTERVENTION: -Continue to recommend Boost TID -Reviewed high protein/high calorie nutrition therapy -Provided pt with nutrition supplement coupons, and nutrition education materials  -Will continue to monitor  NUTRITION DIAGNOSIS: Inadequate oral intake related to pain/early sateity as evidenced by PO intake < 75%, 6.8% body weight loss in one month.   Goal: Pt to meet >/= 90% of their estimated nutrition needs    Monitor:  Total protein/energy intake, labs, weights  Reason for Assessment: MST  53 y.o. male  Admitting Dx: Cancer associated pain  ASSESSMENT: Geoffrey Richardson is an 52 y.o. male a PMH of non-small cell lung cancer with recurrent malignant pleural effusion status post pleural catheter insertion 01/15/2013 (subsequently removed after became infected) and chemotherapy with carboplatin and Taxotere on 02/17/13 status post 6 cycles which completed on 06/02/13, recent hospital admission 01/25/14-02/03/14 for evaluation of abdominal pain and early satiety.Plan was to followup with oncology as an outpatient for consideration of further chemotherapy if his performance status/nutrition improved, however he has failed to thrive and was readmitted 02/22/14 with uncontrolled cancer related pain from malignant ascites and shortness of breath from a malignant pleural effusion.   -Pt's wife reported ongoing decreased appetite since April 2015 d/t abd pain, and early satiety. Diet largely consists of 2-3 Boost daily with minimal intake of solid foods. Wife has been trying to encourage intake of milkshakes, smoothies and offered a variety of nutritional/protein supplement; however pt has refused or disliked  the taste of the majority of supplements. -Has experienced a 13 lbs weight loss in one month (6.8% body weight, severe for time frame) -Reviewed high kcal/protein nutrition therapy, provided nutrition education materials, additional smoothie/milkshake recipes and [employer] and Boost coupons -Pt declining additional snacks or supplements at this time. Encouraged pt to order [employer] with meals as tolerated  Height: Ht Readings from Last 1 Encounters:  02/22/14 6' (1.829 m)    Weight: Wt Readings from Last 1 Encounters:  02/23/14 176 lb 8 oz (80.06 kg)    Ideal Body Weight: 178 lbs  % Ideal Body Weight: 99%  Wt Readings from Last 10 Encounters:  02/23/14 176 lb 8 oz (80.06 kg)  01/25/14 189 lb 8 oz (85.957 kg)  01/25/14 189 lb 8 oz (85.957 kg)  12/15/13 206 lb 12.8 oz (93.804 kg)  11/30/13 208 lb (94.348 kg)  10/18/13 208 lb 8 oz (94.575 kg)  09/13/13 203 lb 9.6 oz (92.352 kg)  08/31/13 202 lb (91.627 kg)  07/15/13 202 lb (91.627 kg)  07/12/13 202 lb 8 oz (91.853 kg)    Usual Body Weight: 208 lbs  % Usual Body Weight: 85%  BMI:  Body mass index is 23.93 kg/(m^2).  Estimated Nutritional Needs: Kcal: 7371-0626 Protein: 120-135 gram Fluid: >/=2300 ml/daily  Skin: WDL  Diet Order: General  EDUCATION NEEDS: -Education needs addressed   Intake/Output Summary (Last 24 hours) at 02/23/14 1322 Last data filed at 02/23/14 1100  Gross per 24 hour  Intake 1002.5 ml  Output    750 ml  Net  252.5 ml    Last BM: 7/13   Labs:   Recent Labs Lab 02/22/14 0922 02/23/14 0427  NA 139 140  K 4.4 4.5  CL 97 103  CO2 25 27  BUN 27* 16  CREATININE 0.78 0.64  CALCIUM 9.3 8.7  GLUCOSE 147* 105*    CBG (last 3)  No results found for this basename: GLUCAP,  in the last 72 hours  Scheduled Meds: . budesonide-formoterol  1 puff Inhalation Daily  . enoxaparin (LOVENOX) injection  40 mg Subcutaneous Q24H  . hydrALAZINE  25 mg Oral  3 times per day  . labetalol  100 mg Oral BID  . lactose free nutrition  237 mL Oral TID  . metoCLOPramide  10 mg Oral TID AC & HS  . multivitamin with minerals  1 tablet Oral Daily  . OxyCODONE  45 mg Oral Q12H  . pantoprazole  40 mg Oral Daily  . senna-docusate  1 tablet Oral Daily    Continuous Infusions: . sodium chloride 50 mL/hr at 02/23/14 1100    Past Medical History  Diagnosis Date  . Tobacco abuse     Began age 62  . Pleural effusion on right   . COPD (chronic obstructive pulmonary disease)   . Hypertension     pt states it fluctuates but not on any medications  . Emphysema   . Shortness of breath     lying/sitting/exertion  . Headache(784.0)     occasionally  . GERD (gastroesophageal reflux disease)     takes Protonix daily  . Nocturia   . Hypothyroidism     but not on medication at the present time  . Insomnia   . Lung cancer 02/04/2013    Past Surgical History  Procedure Laterality Date  . Appendectomy      as a child  . Video assisted thoracoscopy (vats) w/talc pleuadesis Right 01/14/2013    Procedure: VIDEO ASSISTED THORACOSCOPY (VATS) W/TALC PLEUADESIS;  Surgeon: Ivin Poot, MD;  Location: [place];  Service: Thoracic;  Laterality: Right;  . Chest tube insertion Right 01/14/2013    Procedure: INSERTION PLEURAL DRAINAGE CATHETER;  Surgeon: Ivin Poot, MD;  Location: [place];  Service: Thoracic;  Laterality: Right;  . Removal of pleural drainage catheter N/A 04/22/2013    Procedure: MINOR REMOVAL OF PLEURAL DRAINAGE CATHETER;  Surgeon: Ivin Poot, MD;  Location: [place];  Service: Thoracic;  Laterality: N/A;  . Esophagogastroduodenoscopy N/A 01/27/2014    Procedure: ESOPHAGOGASTRODUODENOSCOPY (EGD);  Surgeon: Beryle Beams, MD;  Location: [facility] ENDOSCOPY;  Service: Endoscopy;  Laterality: N/A;    Atlee Abide MS RD LDN Clinical Dietitian [phone]

## 2014-02-23 NOTE — Progress Notes (Signed)
Progress Note   Geoffrey Richardson UDJ:[id number] DOB: 03-03-1961 DOA: 02/22/2014 PCP: Philis Fendt, MD   Brief Narrative:   Geoffrey Richardson is an 53 y.o. male a PMH of non-small cell lung cancer with recurrent malignant pleural effusion status post pleural catheter insertion 01/15/2013 (subsequently removed after became infected) and chemotherapy with carboplatin and Taxotere on 02/17/13 status post 6 cycles which completed on 06/02/13, recent hospital admission 01/25/14-02/03/14 for evaluation of abdominal pain and early satiety. CT scan of the chest, abdomen and pelvis done during that admission showed peritoneal caking and ascites. He had a 1.4 L paracentesis done 01/26/14. Plan was to followup with oncology as an outpatient for consideration of further chemotherapy if his performance status/nutrition improved, however he has failed to thrive and was readmitted 02/22/14 with uncontrolled cancer related pain from malignant ascites and shortness of breath from a malignant pleural effusion.   Assessment/Plan:   Principal Problem:  Cancer associated pain secondary to malignant ascites and peritoneal implants  Palliative care consulted to recommend a more effective outpatient pain management strategy.  Paracentesis done to address tense ascites. 4.1 L removed 02/22/14.  Continue OxyContin, increase dose to 45 mg every 12 hours, and oxycodone 5-10 mg every 4 hours for breakthrough pain. Dilaudid-HP for severe pain.  Active Problems:  Benign essential hypertension  Continue hydralazine and labetalol.  COPD (chronic obstructive pulmonary disease)/chronic respiratory failure with hypoxia  Continue Symbicort. Continue bronchodilators as needed.  Provide supplemental oxygen to keep saturations greater than 92%.  Severe malnutrition  Secondary to cancer related cachexia. Boost nutritional supplements ordered.  Malignant pleural effusion  Effusion is loculated and cannot be drained per interventional  radiology.  Metastatic Lung cancer  Seen by Dr. Alen Richardson 02/22/14. Poor prognosis with no recommendations for chemotherapy.  DVT prophylaxis  Lovenox ordered.  Code Status: Full.  Family Communication: Geoffrey Richardson (close friend): [phone] updated at bedside. Disposition Plan: Home when stable.    IV Access:    Peripheral IV   Procedures and diagnostic studies:    Acute abdominal series 02/22/14: Stable chronic findings in the right hemithorax. No acute cardiopulmonary process. Mild progression of small bowel distention with ascites and peritoneal nodularity on CT concerning for underlying peritoneal carcinomatosis.  Ultrasound guided paracentesis 02/22/14: Successful ultrasound-guided paracentesis yielding 4.1 L of ascites.  Ultrasound of the chest 02/22/14: Chronic multiloculated pleural effusions. Patient had prior Pleurx catheter to the right side. This was removed due to loculation of the effusion and no output from the catheter. The patient may have a trapped right lung and thoracentesis could result in a pneumothorax and therefore was not done.   Medical Consultants:    Dr. Zola Richardson, Oncology.  Dr. Billey Richardson, Palliative Care   Other Consultants:    None.   Anti-Infectives:    None.  Subjective:   Geoffrey Richardson reports some improvement in pain control, but says that pain medicine only lasts temporarily. He has moved his bowels in the last 2 days, though he is unsure when. No nausea or vomiting. Still with significant abdominal and back pain.  Objective:    Filed Vitals:   02/22/14 2142 02/22/14 2311 02/23/14 0514 02/23/14 0816  BP:  121/93 131/87   Pulse: 110 117 109   Temp:   97.5 F (36.4 C)   TempSrc:   Oral   Resp:   19   Height:      Weight:   80.06 kg (176 lb 8 oz)   SpO2:  99% 100%  98%    Intake/Output Summary (Last 24 hours) at 02/23/14 1150 Last data filed at 02/23/14 1100  Gross per 24 hour  Intake 1002.5 ml  Output    750  ml  Net  252.5 ml    Exam: Gen:  NAD, cachectic Cardiovascular:  Tachycardic, No M/R/G Respiratory:  Lungs diminished in the bases Gastrointestinal:  Abdomen distended, but much less so than yesterday, tender throughout Extremities:  2+ edema  Data Reviewed:    Labs: Basic Metabolic Panel:  Recent Labs Lab 02/22/14 0922 02/23/14 0427  NA 139 140  K 4.4 4.5  CL 97 103  CO2 25 27  GLUCOSE 147* 105*  BUN 27* 16  CREATININE 0.78 0.64  CALCIUM 9.3 8.7   GFR Estimated Creatinine Clearance: 118.6 ml/min (by C-G formula based on Cr of 0.64). Liver Function Tests:  Recent Labs Lab 02/22/14 0922  AST 17  ALT 12  ALKPHOS 125*  BILITOT 0.4  PROT 7.8  ALBUMIN 2.6*    Recent Labs Lab 02/22/14 0922  LIPASE 23   CBC:  Recent Labs Lab 02/22/14 0922  WBC 9.0  NEUTROABS 7.6  HGB 12.3*  HCT 40.6  MCV 80.7  PLT 203   Sepsis Labs:  Recent Labs Lab 02/22/14 0922  WBC 9.0   Microbiology No results found for this or any previous visit (from the past 240 hour(s)).   Medications:   . budesonide-formoterol  1 puff Inhalation Daily  . enoxaparin (LOVENOX) injection  40 mg Subcutaneous Q24H  . hydrALAZINE  25 mg Oral 3 times per day  . labetalol  100 mg Oral BID  . lactose free nutrition  237 mL Oral TID  . metoCLOPramide  10 mg Oral TID AC & HS  . multivitamin with minerals  1 tablet Oral Daily  . OxyCODONE  30 mg Oral Q12H  . pantoprazole  40 mg Oral Daily  . senna-docusate  1 tablet Oral Daily   Continuous Infusions: . sodium chloride 50 mL/hr at 02/23/14 1100    Time spent: 35 minutes with > 50% of time discussing current diagnostic test results, clinical impression and plan of care.    LOS: 1 day   Richardson,Geoffrey  [facility] Pager [phone]. If unable to reach me by pager, please call my cell phone at [phone].  *Please refer to [email or web address], password [email or web address] to get updated schedule on who will round on this patient, as hospitalists switch  teams weekly. If 7PM-7AM, please contact night-coverage at [email or web address], password [email or web address] for any overnight needs.  02/23/2014, 11:50 AM    **Disclaimer: This note was dictated with voice recognition software. Similar sounding words can inadvertently be transcribed and this note may contain transcription errors which may not have been corrected upon publication of note.**   Information printed out and reviewed with the patient/family:     In an effort to keep you and your family informed about your hospital stay, I am providing you with this information sheet. If you or your family have any questions, please do not hesitate to have the nursing staff page me to set up a meeting time.  Also note that the hospitalist doctors typically change on Tuesdays or Wednesdays to a different hospitalist doctor.  Geoffrey Richardson 02/23/2014 1 (Number of days in the hospital)  Treatment team:  Dr. Jacquelynn Cree, Hospitalist (Internist)  Dr. Zola Richardson, Cancer specialist  Dr. Billey Richardson, Palliative care specialist (assist with symptom management, goals of care)  Pertinent labs / studies:  The ultrasound  done on your lungs show some fluid, but it is not located in a place that can easily be removed.  4 L of fluid was removed from your abdomen 02/22/14   Principle Diagnosis:  Lung cancer with malignant fluid accumulation in the abdomen and right lung.  Pain related to cancer.   Plan for today: You have been seen by your cancer specialist who does not feel that you could tolerate chemotherapy because of your poor nutritional status and weakness. We will have Dr. Lovena Le speak with you about a better pain management strategy.    Anticipated discharge date: Depends on pain control.

## 2014-02-23 NOTE — Progress Notes (Signed)
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 Since his last discharge from the hospital, he has been doing poorly. Although he is able to maintain some nutrition his performance status continued to decline. He was hospitalized due to increased abdominal pain and shortness of breath and some element of anxiety. He is status post paracentesis which you provided some relief.  This morning, is feeling slightly more comfortable but still overall complains of pain. He is complaining of sharp intermittent mid scapular right sided pain as well as abdominal fullness. He also reported lower extremity swelling. His by mouth intake is very poor.  Objective:  Vital signs in last 24 hours: Temp:  [97.5 F (36.4 C)-98.1 F (36.7 C)] 97.5 F (36.4 C) (07/16 0514) Pulse Rate:  [109-131] 109 (07/16 0514) Resp:  [16-28] 19 (07/16 0514) BP: (120-145)/(84-113) 131/87 mmHg (07/16 0514) SpO2:  [97 %-100 %] 100 % (07/16 0514) Weight:  [176 lb 8 oz (80.06 kg)-177 lb 0.5 oz (80.3 kg)] 176 lb 8 oz (80.06 kg) (07/16 0514) Weight change:  Last BM Date: 02/20/14  Intake/Output from previous day: 07/15 0701 - 07/16 0700 In: 752.5 [P.O.:100; I.V.:652.5] Out: 450 [Urine:450]  Mouth: mucous membranes moist, pharynx normal without lesions Resp: clear to auscultation bilaterally and Decreased breast sounds at the bases. Cardio: regular rate and rhythm, S1, S2 normal, no murmur, click, rub or gallop GI: soft, non-tender; bowel sounds normal; no masses,  no organomegaly and Distended with positive bowel sounds. Tender to touch. Shifting dullness was noted. Extremities: edema Bilaterally    Lab Results:  Recent Labs  02/22/14 0922  WBC 9.0  HGB 12.3*  HCT 40.6  PLT 203    BMET  Recent Labs  02/22/14 0922 02/23/14 0427  NA 139 140  K 4.4 4.5  CL 97 103  CO2 25 27  GLUCOSE 147* 105*  BUN 27* 16   CREATININE 0.78 0.64  CALCIUM 9.3 8.7    Studies/Results: [name] Chest  02/22/2014   CLINICAL DATA:  Metastatic lung cancer. Shortness of breath. Abdominal distention. Known chronic right pleural effusion. Request possible thoracentesis.  EXAM: CHEST ULTRASOUND  COMPARISON:  Chest x-ray from 02/22/2014, CT of the chest from 01/25/2014  FINDINGS: Limited ultrasound of the right chest finds a multiloculated effusion consistent with prior image findings. There also appears to be thickened pleura or rind surrounding the effusion. This is not amenable to percutaneous thoracentesis.  IMPRESSION: Chronic multiloculated pleural effusion. Patient had prior PleurX catheter to the right side. This apparently had to be removed due to loculation of the effusion and no output from the catheter. The patient may have a trapped right lung and thoracentesis could result in a pneumothorax.  Read by:  Ascencion Dike, PA-C   Electronically Signed   By: Markus Daft M.D.   On: 02/22/2014 15:47   US Paracentesis  02/22/2014   CLINICAL DATA:  Metastatic lung cancer with known malignant ascites. Current complaint of abdominal distention and pain. Request diagnostic and therapeutic paracentesis.  EXAM: ULTRASOUND GUIDED PARACENTESIS  COMPARISON:  Previous paracentesis 01/26/2014  PROCEDURE: An ultrasound guided paracentesis was thoroughly discussed with the patient and questions answered. The benefits, risks, alternatives and complications were also discussed. The patient understands and wishes to proceed with the procedure. Written consent was obtained.  Ultrasound was performed to localize and mark an adequate pocket of fluid in the right lower quadrant of the abdomen. The  area was then prepped and draped in the normal sterile fashion. 1% Lidocaine was used for local anesthesia. Under ultrasound guidance a 19 gauge Yueh catheter was introduced. Paracentesis was performed. The catheter was removed and a dressing applied.  COMPLICATIONS:  None immediate  FINDINGS: A total of approximately 4.1 L of dark yellow fluid was removed. A fluid sample was sent for laboratory analysis.  IMPRESSION: Successful ultrasound guided paracentesis yielding 4.1 L of ascites.  Read by:  Ascencion Dike, PA-C   Electronically Signed   By: Markus Daft M.D.   On: 02/22/2014 15:18   Dg Abd Acute W/chest  02/22/2014   CLINICAL DATA:  Shortness of breath with upper abdominal pain. History of lung cancer.  EXAM: ACUTE ABDOMEN SERIES (ABDOMEN 2 VIEW & CHEST 1 VIEW)  COMPARISON:  Abdominal radiographs 01/30/2014 and 01/29/2014. Chest radiographs 01/18/2014 and CT 01/25/2014.  FINDINGS: There is stable chronic volume loss in the right hemithorax with a chronic loculated pleural effusion and partial right lung collapse. Right apical thickening is stable. The left lung is clear. The heart size and mediastinal contours are stable.  Abdominal radiographs demonstrate moderate mid small bowel distension, slightly increased from prior studies. There is some contrast material within the colon IT which is relatively decompressed. Peripheral abdominal density suggests residual ascites. There is no evidence of pneumoperitoneum.  IMPRESSION: 1. Stable chronic findings in the right hemithorax. No acute cardiopulmonary process. 2. 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   Electronically Signed   By: Camie Patience M.D.   On: 02/22/2014 10:04    Medications: I have reviewed the patient's current medications.  Assessment/Plan:  53 year old gentleman with the following issues :  1. Abdominal pain associated with peritoneal implants and ascites. Fluid cytology is positive for malignant cells. EGFR mutation was not done due to insufficient material. His overall clinical status has been poor with a poor performance status. I think chemotherapy offers a very narrow therapeutic margin with very  little to no chance of improving his quality of life. I favor supportive care only and possibly hospice upon discharge if he is ready to go that route. I agree with palliative medicine involvement for symptom management as well as help the finding goals of care.  2. Adenocarcinoma presenting with pleural effusion: No evidence of any recurrent disease in the chest at this time.  3. Prognosis: Poor at this time given his advanced malignancy, poor performance status as well as poor nutritional status.  I will continue to follow with you and to assist you in the care of this pleasant gentleman.   LOS: 1 day   [facility] 02/23/2014, 7:57 AM

## 2014-02-23 NOTE — Progress Notes (Signed)
Pt was in bed when I arrived. Caregiver was bedside. (his sister arrived as we were concluding visit)  Pt's caregiver became tearful as he said he hated for her to see him struggle like this. He said he gets down and negative when he hears reports from doctors. Pt became tearful as he talked about how caregiver and church family has adopted him. He said caregiver led him to [facility]. Pt and caregiver were very supportive of one another, each shedding tears for the other. Pt asked for prayer and wanted prayer for himself and everyone who is going through this and especially his caregiver. Ernest Haber Chaplain  02/23/14 1400  Clinical Encounter Type  Visited With Patient and family together;Health care provider

## 2014-02-23 NOTE — Care Management Note (Addendum)
  Page 2 of 2   03/07/2014     9:53:29 AM CARE MANAGEMENT NOTE 03/07/2014  Patient:  [facility]   Account Number:  [id number]  Date Initiated:  02/23/2014  Documentation initiated by:  [facility]  Subjective/Objective Assessment:   adm: Abdominal pain, shortness of breath     Action/Plan:   discharge planning: palliative vs HH   Anticipated DC Date:  03/08/2014   Anticipated DC Plan:  [place]  CM consult      PAC Choice  HOSPICE   Choice offered to / List presented to:  C-2 HC POA / Guardian        HH arranged  HH-1 RN      [place]   Status of service:  Completed, signed off Medicare Important Message given?  NO (If response is "NO", the following Medicare IM given date fields will be blank) Date Medicare IM given:   Medicare IM given by:   Date Additional Medicare IM given:   Additional Medicare IM given by:    Discharge Disposition:  [place]  Per UR Regulation:    If discussed at Long Length of Stay Meetings, dates discussed:    Comments:  03/07/14 09:40 CM spoke with [name], who states she needs to get space together to be able to have bed delivered; she states she can do this tonight and is ready for pt to come home in the am.  [name] has not spoken with [facility] rep as of yet; CM called Margie of [facility] to relay message of readiness.  Will continue to follow Mariane Masters, BSN, [place].  03/06/2014 1500 NCM spoke to [facility], [phone]. Offered choice for Hospice. Agreeable to [place]. Requesting hospital bed, shower bench, and 3n1 for home. Will evaluate for oxygen also for home. Contacted [facility] with new referral. Faxed facesheet, and progress notes to [place]. Jonnie Finner RN CCM Case Mgmt phone [phone]  03/05/2014 1000 NCM spoke to pt and gave permission to speak with caregiver, [name]. Jonnie Finner RN CCM Case Mgmt phone  [phone]  03/02/14 CM met with pt who continues with his desire to return home with home health.  CM called [name] who states if decision is to go home with home health, [facility] is fine.  [facility] rep, Cyril Mourning is aware.  Will continue to monitor for [facility] orders.  Mariane Masters, BSN, [place] [phone].  02/23/14 16:20 CM met with pt and spoke with [name] on phone to verify address and Marsha's correct contact number which is [phone].  Palliative to consult and CM will continue to follow for disposition.  Mariane Masters, BSN, CM [phone].

## 2014-02-23 NOTE — Consult Note (Signed)
Patient Geoffrey Richardson      DOB: 1961-07-19      UXN:[id number]     Consult Note from the Palliative Medicine Team at [facility] Requested by: Dr. Rockne Menghini    PCP: Philis Fendt, MD Reason for Consultation: Code status, symptom management, disposition             Phone Number:[phone]  Assessment of patients Current state: bRIEF HPI: Geoffrey Richardson is 53 yo male known to me. Current history of NSCLC with recurrent malignant pleural effusion s/p removed infected pleural catheter and 6 cycles chemotherapy. Recent admission found on CT peritoneal caking and malignant ascites. Main issues currently are pain management and severely poor nutrition that is keeping him from continuing chemotherapy. He has had quick progression and decline loosing 10+ pounds in ~2 weeks and worsening ascites and progression of peritoneal caking.   I have spoken with [name] today with his friend - [name], brother, and sister. We addressed his pain and he was taking the dialudid 3 mg po along with Vicodin ~ every 3 hours which would take 30-40 minutes to work while his pain continued to worsen to unbearable but then he would have 1-1.5 hours of relief. He was taking Oxycontin 30 mg every 12 hours as well. I have consulted with Dr. Lovena Le who agrees with Dr. Quincy Sheehan increase of Oxycontin to 45 mg every 12 hours and to reassess this efficacy in the morning. Pain continues to be epigastric deep pressure with more sharp pain radiating around to his back under bilateral scapula and is fairly constant but improved with medication briefly. I would also consider IR to place drain for ascites if indicated for comfort. I will plan to speak with [name] tomorrow more regarding goals of care and code status but he continues to verbalize desire for continued aggressive care with "strong will to live."    Goals of Care: 1.  Code Status: FULL   2. Scope of Treatment: Continue all available and offered medical  interventions.   4. Disposition: Home with home health.    3. Symptom Management:   1. Pain: Recommend the ordered increase in Oxycontin from 30 mg to 45 mg po every 12 hours. Reassess in morning for efficacy and sedation. Continue dilaudid IV at 1 mg but every 3 hours prn. Give Oxycodone 10 mg every 4 hours prn prior to dilaudid.  2. Bowel Regimen: Senokot-S daily. Miralax daily prn.  3. Fever: Acetaminophen prn.  4. Nausea/Vomiting: Ondansetron prn.   4. Psychosocial: Emotional support provided to patient and friends/family who are dealing with terrible pain.   5. Spiritual: Chaplain following. Mr. Ferreras relies heavily in his faith.     Patient Documents Completed or Given: Document Given Completed  Advanced Directives Pkt yes   MOST    DNR    Gone from My Sight    Hard Choices      ROS: + pain (see description above) epigastric and sub-scapular, + anxiety, + constipation at times    PMH:  Past Medical History  Diagnosis Date  . Tobacco abuse     Began age 89  . Pleural effusion on right   . COPD (chronic obstructive pulmonary disease)   . Hypertension     pt states it fluctuates but not on any medications  . Emphysema   . Shortness of breath     lying/sitting/exertion  . Headache(784.0)     occasionally  . GERD (gastroesophageal reflux disease)  takes Protonix daily  . Nocturia   . Hypothyroidism     but not on medication at the present time  . Insomnia   . Lung cancer 02/04/2013     PSH: Past Surgical History  Procedure Laterality Date  . Appendectomy      as a child  . Video assisted thoracoscopy (vats) w/talc pleuadesis Right 01/14/2013    Procedure: VIDEO ASSISTED THORACOSCOPY (VATS) W/TALC PLEUADESIS;  Surgeon: Ivin Poot, MD;  Location: [facility];  Service: Thoracic;  Laterality: Right;  . Chest tube insertion Right 01/14/2013    Procedure: INSERTION PLEURAL DRAINAGE CATHETER;  Surgeon: Ivin Poot, MD;  Location: [facility];  Service: Thoracic;   Laterality: Right;  . Removal of pleural drainage catheter N/A 04/22/2013    Procedure: MINOR REMOVAL OF PLEURAL DRAINAGE CATHETER;  Surgeon: Ivin Poot, MD;  Location: [facility];  Service: Thoracic;  Laterality: N/A;  . Esophagogastroduodenoscopy N/A 01/27/2014    Procedure: ESOPHAGOGASTRODUODENOSCOPY (EGD);  Surgeon: Beryle Beams, MD;  Location: [facility];  Service: Endoscopy;  Laterality: N/A;   I have reviewed the [place] and [facility] and  If appropriate update it with new information. Allergies  Allergen Reactions  . Morphine And Related Nausea And Vomiting and Other (See Comments)    dizziness   Scheduled Meds: . budesonide-formoterol  1 puff Inhalation Daily  . enoxaparin (LOVENOX) injection  40 mg Subcutaneous Q24H  . hydrALAZINE  25 mg Oral 3 times per day  . labetalol  100 mg Oral BID  . lactose free nutrition  237 mL Oral TID  . metoCLOPramide  10 mg Oral TID AC & HS  . multivitamin with minerals  1 tablet Oral Daily  . OxyCODONE  45 mg Oral Q12H  . pantoprazole  40 mg Oral Daily  . senna-docusate  1 tablet Oral Daily   Continuous Infusions: . sodium chloride 50 mL/hr at 02/23/14 1100   PRN Meds:.acetaminophen, acetaminophen, albuterol, alum & mag hydroxide-simeth, HYDROmorphone (DILAUDID) injection, ondansetron (ZOFRAN) IV, ondansetron, oxyCODONE, polyethylene glycol    BP 110/74  Pulse 112  Temp(Src) 98.2 F (36.8 C) (Oral)  Resp 18  Ht 6' (1.829 m)  Wt 80.06 kg (176 lb 8 oz)  BMI 23.93 kg/m2  SpO2 100%   PPS: 30%   Intake/Output Summary (Last 24 hours) at 02/23/14 1508 Last data filed at 02/23/14 1100  Gross per 24 hour  Intake 1002.5 ml  Output    750 ml  Net  252.5 ml   LBM: 02/20/14                        Physical Exam:  General: NAD, cachectic, ill appearing  HEENT: Severe temporal muscle wasting, no JVD, oral mucosa moist Chest: CTA throughout, diminished bases, no labored breathing  CVS: Tachycardic  Abdomen: Distended, tender, +BS  Ext: MAE, 2+  BLE edema, warm to touch  Neuro: Awake, alert, oriented x 3   Labs: CBC    Component Value Date/Time   WBC 9.0 02/22/2014 0922   WBC 6.1 12/13/2013 0803   RBC 5.03 02/22/2014 0922   RBC 4.53 12/13/2013 0803   HGB 12.3* 02/22/2014 0922   HGB 11.3* 12/13/2013 0803   HCT 40.6 02/22/2014 0922   HCT 37.0* 12/13/2013 0803   PLT 203 02/22/2014 0922   PLT 267 12/13/2013 0803   MCV 80.7 02/22/2014 0922   MCV 81.8 12/13/2013 0803   MCH 24.5* 02/22/2014 0922   MCH 24.9* 12/13/2013  0803   MCHC 30.3 02/22/2014 0922   MCHC 30.5* 12/13/2013 0803   RDW 17.9* 02/22/2014 0922   RDW 18.5* 12/13/2013 0803   LYMPHSABS 0.9 02/22/2014 0922   LYMPHSABS 1.5 12/13/2013 0803   MONOABS 0.6 02/22/2014 0922   MONOABS 0.4 12/13/2013 0803   EOSABS 0.0 02/22/2014 0922   EOSABS 0.0 12/13/2013 0803   BASOSABS 0.0 02/22/2014 0922   BASOSABS 0.0 12/13/2013 0803    BMET    Component Value Date/Time   NA 140 02/23/2014 0427   NA 144 12/13/2013 0803   K 4.5 02/23/2014 0427   K 4.1 12/13/2013 0803   CL 103 02/23/2014 0427   CL 103 01/19/2013 1008   CO2 27 02/23/2014 0427   CO2 26 12/13/2013 0803   GLUCOSE 105* 02/23/2014 0427   GLUCOSE 109 12/13/2013 0803   GLUCOSE 129* 01/19/2013 1008   BUN 16 02/23/2014 0427   BUN 16.1 12/13/2013 0803   CREATININE 0.64 02/23/2014 0427   CREATININE 0.8 12/13/2013 0803   CALCIUM 8.7 02/23/2014 0427   CALCIUM 9.5 12/13/2013 0803   GFRNONAA >90 02/23/2014 0427   GFRAA >90 02/23/2014 0427    CMP     Component Value Date/Time   NA 140 02/23/2014 0427   NA 144 12/13/2013 0803   K 4.5 02/23/2014 0427   K 4.1 12/13/2013 0803   CL 103 02/23/2014 0427   CL 103 01/19/2013 1008   CO2 27 02/23/2014 0427   CO2 26 12/13/2013 0803   GLUCOSE 105* 02/23/2014 0427   GLUCOSE 109 12/13/2013 0803   GLUCOSE 129* 01/19/2013 1008   BUN 16 02/23/2014 0427   BUN 16.1 12/13/2013 0803   CREATININE 0.64 02/23/2014 0427   CREATININE 0.8 12/13/2013 0803   CALCIUM 8.7 02/23/2014 0427   CALCIUM 9.5 12/13/2013 0803   PROT 7.8 02/22/2014 0922   PROT 8.3 12/13/2013  0803   ALBUMIN 2.6* 02/22/2014 0922   ALBUMIN 3.1* 12/13/2013 0803   AST 17 02/22/2014 0922   AST 10 12/13/2013 0803   ALT 12 02/22/2014 0922   ALT <6 12/13/2013 0803   ALKPHOS 125* 02/22/2014 0922   ALKPHOS 94 12/13/2013 0803   BILITOT 0.4 02/22/2014 0922   BILITOT 0.21 12/13/2013 0803   GFRNONAA >90 02/23/2014 0427   GFRAA >90 02/23/2014 0427     Time In Time Out Total Time Spent with Patient Total Overall Time  1400 1520 7min 84min    Greater than 50%  of this time was spent counseling and coordinating care related to the above assessment and plan.  Vinie Sill, NP Palliative Medicine Team Pager # [phone] (M-F 8a-5p) Team Phone # [phone] (Nights/Weekends)

## 2014-02-23 NOTE — Progress Notes (Addendum)
Full note to follow:  I have spoken with Mr. Gatling today with his significant other - [name], brother, and sister. We addressed his pain and he was taking the dialudid 3 mg po along with Vicodin ~ every 3 hours which would take 30-40 minutes to work while his pain continued to worsen to unbearable but then he would have 1-1.5 hours of relief. He was taking Oxycontin 30 mg every 12 hours as well. I have consulted with Dr. Lovena Le who agrees with Dr. Quincy Sheehan increase of Oxycontin to 45 mg every 12 hours and to reassess this efficacy in the morning. I would also consider IR to place drain for ascites if indicated for comfort. I will plan to speak with Mr. Soules tomorrow more regarding goals of care and code status.  Vinie Sill, NP Palliative Medicine Team Pager # [phone] (M-F 8a-5p) Team Phone # [phone] (Nights/Weekends)

## 2014-02-24 ENCOUNTER — Encounter (HOSPITAL_COMMUNITY): Payer: Self-pay

## 2014-02-24 MED ORDER — ALPRAZOLAM 0.25 MG PO TABS
0.2500 mg | ORAL_TABLET | Freq: Three times a day (TID) | ORAL | Status: DC | PRN
  Administered 2014-02-25: 0.25 mg via ORAL
  Filled 2014-02-24: qty 1

## 2014-02-24 MED ORDER — LIDOCAINE 5 % EX PTCH
2.0000 | MEDICATED_PATCH | Freq: Every morning | CUTANEOUS | Status: DC
  Administered 2014-02-24 – 2014-03-01 (×5): 2 via TRANSDERMAL
  Filled 2014-02-24 (×13): qty 2

## 2014-02-24 MED ORDER — OXYCODONE HCL 5 MG PO TABS
10.0000 mg | ORAL_TABLET | ORAL | Status: DC | PRN
  Administered 2014-02-25 – 2014-03-06 (×13): 10 mg via ORAL
  Filled 2014-02-24 (×15): qty 2

## 2014-02-24 MED ORDER — HYDROMORPHONE HCL PF 1 MG/ML IJ SOLN
1.0000 mg | INTRAMUSCULAR | Status: DC | PRN
  Administered 2014-02-25 – 2014-02-26 (×9): 1 mg via INTRAVENOUS
  Filled 2014-02-24 (×9): qty 1

## 2014-02-24 NOTE — Progress Notes (Signed)
Progress Note from the Palliative Medicine Team at [facility]: I spoke with Geoffrey Richardson and friend [name] at bedside. We discussed goals of care and we discussed the reality that as providers we are concerned he will not get to a point where he will be able to receive chemotherapy (he has lost 10+ lbs in 2 weeks). He was open to listening to me but was not accepting. He understand that I explained it would not be fair to him if we are not honest with him about the unfortunate situation he is in. 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 We did discuss that in his situation I don't believe people give up but I do believe people reach a point where they have nothing left to give and nothing left to fight - and there is nothing wrong with being honest and admitting to himself and [name] if he reaches this point. He says he will let us know if he gets there but is very determined. He is very spiritual and relies on his faith to provide healing and the strength he needs. I acknowledged that we are at a time where healing and strength will have to come from a higher power as we cannot provide this for him. He tells me that if God decides that it is his time to die then he would accept this - but he also feels that he has much more he needs to do and more life to live.   Also says that he is technically legally married but has been separated ~7 years. He says that [name] would be who he wants to make decisions for him if he is unable too. I gave him an Emergency planning/management officer and explained the importance of naming HCPOA while he is able too.    Objective: Allergies  Allergen Reactions  . Morphine And Related Nausea And Vomiting and Other (See Comments)    dizziness   Scheduled Meds: . budesonide-formoterol  1 puff Inhalation Daily   . enoxaparin (LOVENOX) injection  40 mg Subcutaneous Q24H  . hydrALAZINE  25 mg Oral 3 times per day  . labetalol  100 mg Oral BID  . lactose free nutrition  237 mL Oral TID  . lidocaine  2 patch Transdermal q morning - 10a  . metoCLOPramide  10 mg Oral TID AC & HS  . multivitamin with minerals  1 tablet Oral Daily  . OxyCODONE  45 mg Oral Q12H  . pantoprazole  40 mg Oral Daily  . senna-docusate  1 tablet Oral Daily   Continuous Infusions: . sodium chloride 50 mL/hr at 02/24/14 1732   PRN Meds:.acetaminophen, acetaminophen, albuterol, ALPRAZolam, alum & mag hydroxide-simeth, HYDROmorphone (DILAUDID) injection, ondansetron (ZOFRAN) IV, ondansetron, oxyCODONE, polyethylene glycol  BP 125/97  Pulse 114  Temp(Src) 97.9 F (36.6 C) (Oral)  Resp 16  Ht 6' (1.829 m)  Wt 80.06 kg (176 lb 8 oz)  BMI 23.93 kg/m2  SpO2 99%   PPS: 30% at best  Pain Score: 7/10 Pain Location: Epigastric   Intake/Output Summary (Last 24 hours) at 02/24/14 2326 Last data filed at 02/24/14 2242  Gross per 24 hour  Intake   1120 ml  Output    300 ml  Net    820 ml  LBM: 02/20/14      Physical Exam:  General: NAD, cachectic, ill appearing HEENT: Severe temporal muscle wasting, no JVD, oral mucosa moist without exudate Chest: CTA throughout, diminished bases, no labored breathing CVS: Tachycardic Abdomen: Distended, tender, +BS Ext: MAE, 2+ BLE edema, warm to touch Neuro: Awake, alert, oriented x 3   Labs: CBC    Component Value Date/Time   WBC 9.0 02/22/2014 0922   WBC 6.1 12/13/2013 0803   RBC 5.03 02/22/2014 0922   RBC 4.53 12/13/2013 0803   HGB 12.3* 02/22/2014 0922   HGB 11.3* 12/13/2013 0803   HCT 40.6 02/22/2014 0922   HCT 37.0* 12/13/2013 0803   PLT 203 02/22/2014 0922   PLT 267 12/13/2013 0803   MCV 80.7 02/22/2014 0922   MCV 81.8 12/13/2013 0803   MCH 24.5* 02/22/2014 0922   MCH 24.9* 12/13/2013 0803   MCHC 30.3 02/22/2014 0922   MCHC 30.5* 12/13/2013 0803   RDW 17.9* 02/22/2014 0922   RDW  18.5* 12/13/2013 0803   LYMPHSABS 0.9 02/22/2014 0922   LYMPHSABS 1.5 12/13/2013 0803   MONOABS 0.6 02/22/2014 0922   MONOABS 0.4 12/13/2013 0803   EOSABS 0.0 02/22/2014 0922   EOSABS 0.0 12/13/2013 0803   BASOSABS 0.0 02/22/2014 0922   BASOSABS 0.0 12/13/2013 0803    BMET    Component Value Date/Time   NA 140 02/23/2014 0427   NA 144 12/13/2013 0803   K 4.5 02/23/2014 0427   K 4.1 12/13/2013 0803   CL 103 02/23/2014 0427   CL 103 01/19/2013 1008   CO2 27 02/23/2014 0427   CO2 26 12/13/2013 0803   GLUCOSE 105* 02/23/2014 0427   GLUCOSE 109 12/13/2013 0803   GLUCOSE 129* 01/19/2013 1008   BUN 16 02/23/2014 0427   BUN 16.1 12/13/2013 0803   CREATININE 0.64 02/23/2014 0427   CREATININE 0.8 12/13/2013 0803   CALCIUM 8.7 02/23/2014 0427   CALCIUM 9.5 12/13/2013 0803   GFRNONAA >90 02/23/2014 0427   GFRAA >90 02/23/2014 0427    CMP     Component Value Date/Time   NA 140 02/23/2014 0427   NA 144 12/13/2013 0803   K 4.5 02/23/2014 0427   K 4.1 12/13/2013 0803   CL 103 02/23/2014 0427   CL 103 01/19/2013 1008   CO2 27 02/23/2014 0427   CO2 26 12/13/2013 0803   GLUCOSE 105* 02/23/2014 0427   GLUCOSE 109 12/13/2013 0803   GLUCOSE 129* 01/19/2013 1008   BUN 16 02/23/2014 0427   BUN 16.1 12/13/2013 0803   CREATININE 0.64 02/23/2014 0427   CREATININE 0.8 12/13/2013 0803   CALCIUM 8.7 02/23/2014 0427   CALCIUM 9.5 12/13/2013 0803   PROT 7.8 02/22/2014 0922   PROT 8.3 12/13/2013 0803   ALBUMIN 2.6* 02/22/2014 0922   ALBUMIN 3.1* 12/13/2013 0803   AST 17 02/22/2014 0922   AST 10 12/13/2013 0803   ALT 12 02/22/2014 0922   ALT <6 12/13/2013 0803   ALKPHOS 125* 02/22/2014 0922   ALKPHOS 94 12/13/2013 0803   BILITOT 0.4 02/22/2014 0922   BILITOT 0.21 12/13/2013 0803   GFRNONAA >90 02/23/2014 0427   GFRAA >90 02/23/2014 0427     Assessment and Plan: 1. Code Status: FULL 2. Symptom Control: 1. Anxiety: Alprazolam 0.25 mg TID prn.  2. Nausea: Ondansetron prn.  3. Bowel Regimen: Senokot-S daily. Miralax prn.  4. Pain:  1. Lidoderm patches under  scapulas for back pain.  2. Oxycontin 45 mg every 12 hours. Consider increasing  dose of oxycontin or increasing frequency to every 8 hours tomorrow.  3. Oxycodone 10 mg every 4 hours prn.  4. Dilaudid 1 mg every 3 hours prn and nursing to utilize ONLY after oxycodone ineffective.  3. Psycho/Social: Emotional support provided to patient and family/friends.  4. Spiritual: Chaplain following.  5. Disposition: Home with home health.   Patient Documents Completed or Given: Document Given Completed  Advanced Directives Pkt yes   MOST    DNR    Gone from My Sight    Hard Choices      Time In Time Out Total Time Spent with Patient Total Overall Time  1045 1200 38min 39min    Greater than 50%  of this time was spent counseling and coordinating care related to the above assessment and plan.  Vinie Sill, NP Palliative Medicine Team Pager # [phone] (M-F 8a-5p) Team Phone # [phone] (Nights/Weekends)   1

## 2014-02-24 NOTE — Progress Notes (Signed)
Progress Note   Geoffrey Richardson FMB:[id number] DOB: [birth year] DOA: 02/22/2014 PCP: Philis Fendt, MD   Brief Narrative:   Geoffrey Richardson is an 53 y.o. male a PMH of non-small cell lung cancer with recurrent malignant pleural effusion status post pleural catheter insertion 01/15/2013 (subsequently removed after became infected) and chemotherapy with carboplatin and Taxotere on 02/17/13 status post 6 cycles which completed on 06/02/13, recent hospital admission 01/25/14-02/03/14 for evaluation of abdominal pain and early satiety. CT scan of the chest, abdomen and pelvis done during that admission showed peritoneal caking and ascites. He had a 1.4 L paracentesis done 01/26/14. Plan was to followup with oncology as an outpatient for consideration of further chemotherapy if his performance status/nutrition improved, however he has failed to thrive and was readmitted 02/22/14 with uncontrolled cancer related pain from malignant ascites and shortness of breath from a malignant pleural effusion. Palliative care has been consulted.  Assessment/Plan:   Principal Problem:  Cancer associated pain secondary to malignant ascites and peritoneal implants  Palliative care consulted to recommend a more effective outpatient pain management strategy.  Paracentesis done to address tense ascites. 4.1 L removed 02/22/14.  Continue OxyContin (dose to 45 mg every 12 hours on 02/23/14), and oxycodone 5-10 mg every 4 hours for breakthrough pain. Dilaudid-HP for severe pain. Continue lidocaine patches, added by the palliative care team. Increase mobilization/PT evaluation.  Active Problems:  Benign essential hypertension  Continue hydralazine and labetalol.  COPD (chronic obstructive pulmonary disease)/chronic respiratory failure with hypoxia  Continue Symbicort. Continue bronchodilators as needed.  Provide supplemental oxygen to keep saturations greater than 92%.  Severe malnutrition   Pt meets criteria for severe  MALNUTRITION in the context of chronic illness as evidenced by PO intake < 75% for > one month, 6.8% body weight loss in one month. Secondary to cancer related cachexia. Boost nutritional supplements ordered. Dietitian following.  Malignant pleural effusion  Effusion is loculated and cannot be drained per interventional radiology.  Metastatic Lung cancer  Seen by Dr. [name] 02/22/14. Poor prognosis with no recommendations for chemotherapy.  DVT prophylaxis  Lovenox ordered.  Code Status: Full.  Family Communication: [name] (close friend): [phone] updated at bedside. Disposition Plan: Home when stable.    IV Access:    Peripheral IV   Procedures and diagnostic studies:    Acute abdominal series 02/22/14: Stable chronic findings in the right hemithorax. No acute cardiopulmonary process. Mild progression of small bowel distention with ascites and peritoneal nodularity on CT concerning for underlying peritoneal carcinomatosis.  Ultrasound guided paracentesis 02/22/14: Successful ultrasound-guided paracentesis yielding 4.1 L of ascites.  Ultrasound of the chest 02/22/14: Chronic multiloculated pleural effusions. Patient had prior Pleurx catheter to the right side. This was removed due to loculation of the effusion and no output from the catheter. The patient may have a trapped right lung and thoracentesis could result in a pneumothorax and therefore was not done.   Medical Consultants:    Dr. Zola Button, Oncology.  Dr. Billey Chang, Palliative Care   Other Consultants:    None.   Anti-Infectives:    None.  Subjective:   Geoffrey Richardson states that his abdomen is sore but not particularly painful. Still having some back pain but overall improved from admission. Has had some nausea but no frank vomiting. Appetite is minimal. No BM, the patient feels he is not eating enough to have a stool.  Objective:    Filed Vitals:   02/23/14 1428 02/23/14 2108  02/24/14 0510 02/24/14  0726  BP: 110/74 121/89 117/82   Pulse: 112 111 103   Temp: 98.2 F (36.8 C) 98 F (36.7 C) 97.5 F (36.4 C)   TempSrc: Oral Oral Oral   Resp: 18 16 18    Height:      Weight:      SpO2: 100% 100% 100% 97%    Intake/Output Summary (Last 24 hours) at 02/24/14 0810 Last data filed at 02/23/14 2108  Gross per 24 hour  Intake    470 ml  Output    500 ml  Net    -30 ml    Exam: Gen:  NAD, cachectic Cardiovascular:  Tachycardic, No M/R/G Respiratory:  Lungs diminished in the bases Gastrointestinal:  Abdomen distended, remains tender  Extremities:  2+ edema  Data Reviewed:    Labs: Basic Metabolic Panel:  Recent Labs Lab 02/22/14 0922 02/23/14 0427  NA 139 140  K 4.4 4.5  CL 97 103  CO2 25 27  GLUCOSE 147* 105*  BUN 27* 16  CREATININE 0.78 0.64  CALCIUM 9.3 8.7   GFR Estimated Creatinine Clearance: 118.6 ml/min (by C-G formula based on Cr of 0.64). Liver Function Tests:  Recent Labs Lab 02/22/14 0922  AST 17  ALT 12  ALKPHOS 125*  BILITOT 0.4  PROT 7.8  ALBUMIN 2.6*    Recent Labs Lab 02/22/14 0922  LIPASE 23   CBC:  Recent Labs Lab 02/22/14 0922  WBC 9.0  NEUTROABS 7.6  HGB 12.3*  HCT 40.6  MCV 80.7  PLT 203   Sepsis Labs:  Recent Labs Lab 02/22/14 0922  WBC 9.0   Microbiology No results found for this or any previous visit (from the past 240 hour(s)).   Medications:   . budesonide-formoterol  1 puff Inhalation Daily  . enoxaparin (LOVENOX) injection  40 mg Subcutaneous Q24H  . hydrALAZINE  25 mg Oral 3 times per day  . labetalol  100 mg Oral BID  . lactose free nutrition  237 mL Oral TID  . metoCLOPramide  10 mg Oral TID AC & HS  . multivitamin with minerals  1 tablet Oral Daily  . OxyCODONE  45 mg Oral Q12H  . pantoprazole  40 mg Oral Daily  . senna-docusate  1 tablet Oral Daily   Continuous Infusions: . sodium chloride 50 mL/hr at 02/23/14 1100    Time spent: 25 minutes.    LOS: 2 days    RAMA,CHRISTINA  [facility] Pager [phone]. If unable to reach me by pager, please call my cell phone at [phone].  *Please refer to [email or web address], password [email or web address] to get updated schedule on who will round on this patient, as hospitalists switch teams weekly. If 7PM-7AM, please contact night-coverage at [email or web address], password [email or web address] for any overnight needs.  02/24/2014, 8:10 AM    **Disclaimer: This note was dictated with voice recognition software. Similar sounding words can inadvertently be transcribed and this note may contain transcription errors which may not have been corrected upon publication of note.**

## 2014-02-25 MED ORDER — CHLORPROMAZINE HCL 10 MG PO TABS
10.0000 mg | ORAL_TABLET | Freq: Three times a day (TID) | ORAL | Status: DC | PRN
  Filled 2014-02-25: qty 1

## 2014-02-25 NOTE — Progress Notes (Signed)
PT Cancellation Note  Patient Details Name: Geoffrey Richardson MRN: [id number] DOB: Jul 08, 1961   Cancelled Treatment:    Reason Eval/Treat Not Completed: Fatigue/lethargy limiting ability to participate (too sleepy)   [name] 02/25/2014, 4:06 PM

## 2014-02-25 NOTE — Progress Notes (Signed)
Progress Note   Geoffrey Richardson JJO:[id number] DOB: [birth year] DOA: 02/22/2014 PCP: Philis Fendt, MD   Brief Narrative:   Geoffrey Richardson is an 53 y.o. male a PMH of non-small cell lung cancer with recurrent malignant pleural effusion status post pleural catheter insertion 01/15/2013 (subsequently removed after became infected) and chemotherapy with carboplatin and Taxotere on 02/17/13 status post 6 cycles which completed on 06/02/13, recent hospital admission 01/25/14-02/03/14 for evaluation of abdominal pain and early satiety. CT scan of the chest, abdomen and pelvis done during that admission showed peritoneal caking and ascites. He had a 1.4 L paracentesis done 01/26/14. Plan was to followup with oncology as an outpatient for consideration of further chemotherapy if his performance status/nutrition improved, however he has failed to thrive and was readmitted 02/22/14 with uncontrolled cancer related pain from malignant ascites and shortness of breath from a malignant pleural effusion. Palliative care has been consulted.  Assessment/Plan:   Principal Problem:  Cancer associated pain secondary to malignant ascites and peritoneal implants  Palliative care consulted to recommend a more effective outpatient pain management strategy.  Paracentesis done to address tense ascites. 4.1 L removed 02/22/14.  Continue OxyContin (dose increased to 45 mg every 12 hours on 02/23/14), and oxycodone 5-10 mg every 4 hours for breakthrough pain. Dilaudid-HP for severe pain. Continue lidocaine patches, added by the palliative care team. Increase mobilization/PT evaluation. Patient continues to want aggressive care and full CODE STATUS.  Active Problems:  Benign essential hypertension  Continue hydralazine and labetalol.  COPD (chronic obstructive pulmonary disease)/chronic respiratory failure with hypoxia  Continue Symbicort. Continue bronchodilators as needed.  Provide supplemental oxygen to keep saturations  greater than 92%.  Severe malnutrition   Pt meets criteria for severe MALNUTRITION in the context of chronic illness as evidenced by PO intake < 75% for > one month, 6.8% body weight loss in one month. Secondary to cancer related cachexia. Boost nutritional supplements ordered. Dietitian following.  Malignant pleural effusion  Effusion is loculated and cannot be drained per interventional radiology.  Metastatic Lung cancer  Seen by Dr. [name] 02/22/14. Poor prognosis with no recommendations for chemotherapy.  DVT prophylaxis  Lovenox ordered.  Code Status: Full.  Family Communication: Geoffrey Richardson (close friend): [phone] updated at bedside 02/24/14. Disposition Plan: Home when stable.    IV Access:    Peripheral IV   Procedures and diagnostic studies:    Acute abdominal series 02/22/14: Stable chronic findings in the right hemithorax. No acute cardiopulmonary process. Mild progression of small bowel distention with ascites and peritoneal nodularity on CT concerning for underlying peritoneal carcinomatosis.  Ultrasound guided paracentesis 02/22/14: Successful ultrasound-guided paracentesis yielding 4.1 L of ascites.  Ultrasound of the chest 02/22/14: Chronic multiloculated pleural effusions. Patient had prior Pleurx catheter to the right side. This was removed due to loculation of the effusion and no output from the catheter. The patient may have a trapped right lung and thoracentesis could result in a pneumothorax and therefore was not done.   Medical Consultants:    Dr. Zola Button, Oncology.  Dr. Billey Chang, Palliative Care   Other Consultants:    None.   Anti-Infectives:    None.  Subjective:   Geoffrey Richardson continues to have intermittent abdominal and back pain, relieved with IV pain medications. Appetite remains poor. His shortness of breath with activity. States his bowels moved the day before yesterday. Complains of hiccups.  Objective:     Filed Vitals:   02/24/14 2026 02/24/14 2108 02/25/14 0502 02/25/14  0757  BP:  125/97 114/90   Pulse:  114 109   Temp:  97.9 F (36.6 C) 97.8 F (36.6 C)   TempSrc:  Oral Oral   Resp:  16 16   Height:      Weight:   79.107 kg (174 lb 6.4 oz)   SpO2: 98% 99% 100% 98%    Intake/Output Summary (Last 24 hours) at 02/25/14 0759 Last data filed at 02/25/14 0600  Gross per 24 hour  Intake   1520 ml  Output    375 ml  Net   1145 ml    Exam: Gen:  NAD, cachectic with hiccups Cardiovascular:  Tachycardic, No M/R/G Respiratory:  Lungs diminished in the bases Gastrointestinal:  Abdomen distended, remains tender  Extremities:  2+ edema  Data Reviewed:    Labs: Basic Metabolic Panel:  Recent Labs Lab 02/22/14 0922 02/23/14 0427  NA 139 140  K 4.4 4.5  CL 97 103  CO2 25 27  GLUCOSE 147* 105*  BUN 27* 16  CREATININE 0.78 0.64  CALCIUM 9.3 8.7   GFR Estimated Creatinine Clearance: 118.6 ml/min (by C-G formula based on Cr of 0.64). Liver Function Tests:  Recent Labs Lab 02/22/14 0922  AST 17  ALT 12  ALKPHOS 125*  BILITOT 0.4  PROT 7.8  ALBUMIN 2.6*    Recent Labs Lab 02/22/14 0922  LIPASE 23   CBC:  Recent Labs Lab 02/22/14 0922  WBC 9.0  NEUTROABS 7.6  HGB 12.3*  HCT 40.6  MCV 80.7  PLT 203   Sepsis Labs:  Recent Labs Lab 02/22/14 0922  WBC 9.0   Microbiology No results found for this or any previous visit (from the past 240 hour(s)).   Medications:   . budesonide-formoterol  1 puff Inhalation Daily  . enoxaparin (LOVENOX) injection  40 mg Subcutaneous Q24H  . hydrALAZINE  25 mg Oral 3 times per day  . labetalol  100 mg Oral BID  . lactose free nutrition  237 mL Oral TID  . lidocaine  2 patch Transdermal q morning - 10a  . metoCLOPramide  10 mg Oral TID AC & HS  . multivitamin with minerals  1 tablet Oral Daily  . OxyCODONE  45 mg Oral Q12H  . pantoprazole  40 mg Oral Daily  . senna-docusate  1 tablet Oral Daily    Continuous Infusions: . sodium chloride 50 mL/hr at 02/24/14 1732    Time spent: 25 minutes.    LOS: 3 days   [facility] Pager [phone]. If unable to reach me by pager, please call my cell phone at [phone].  *Please refer to [email or web address], password [email or web address] to get updated schedule on who will round on this patient, as hospitalists switch teams weekly. If 7PM-7AM, please contact night-coverage at [email or web address], password [email or web address] for any overnight needs.  02/25/2014, 7:59 AM    **Disclaimer: This note was dictated with voice recognition software. Similar sounding words can inadvertently be transcribed and this note may contain transcription errors which may not have been corrected upon publication of note.**

## 2014-02-26 ENCOUNTER — Inpatient Hospital Stay (HOSPITAL_COMMUNITY): Payer: Medicaid Other

## 2014-02-26 DIAGNOSIS — R1084 Generalized abdominal pain: Secondary | ICD-10-CM

## 2014-02-26 DIAGNOSIS — R0989 Other specified symptoms and signs involving the circulatory and respiratory systems: Secondary | ICD-10-CM

## 2014-02-26 DIAGNOSIS — R0600 Dyspnea, unspecified: Secondary | ICD-10-CM

## 2014-02-26 DIAGNOSIS — R0609 Other forms of dyspnea: Secondary | ICD-10-CM

## 2014-02-26 MED ORDER — ALPRAZOLAM 0.5 MG PO TABS
0.5000 mg | ORAL_TABLET | Freq: Three times a day (TID) | ORAL | Status: DC | PRN
  Administered 2014-02-27 – 2014-03-05 (×5): 0.5 mg via ORAL
  Filled 2014-02-26 (×5): qty 1

## 2014-02-26 MED ORDER — HYDROMORPHONE HCL PF 1 MG/ML IJ SOLN
1.0000 mg | INTRAMUSCULAR | Status: DC | PRN
  Administered 2014-02-26 – 2014-02-27 (×10): 1 mg via INTRAVENOUS
  Filled 2014-02-26 (×10): qty 1

## 2014-02-26 MED ORDER — ALBUTEROL SULFATE (2.5 MG/3ML) 0.083% IN NEBU
2.5000 mg | INHALATION_SOLUTION | Freq: Once | RESPIRATORY_TRACT | Status: AC
  Administered 2014-02-26: 2.5 mg via RESPIRATORY_TRACT

## 2014-02-26 NOTE — Progress Notes (Signed)
Pt has become very short of breath x 3 this shift. This last time occurred three hours after receiving last PRN breathing treatment. Walden Field NP paged and a one time order was received for an extra dose. This RN administered, and will continue to monitor. Noreene Larsson RN, BSN

## 2014-02-26 NOTE — Procedures (Signed)
US guided RLQ para  2.5 liters- amber fluid Pt tolerated well

## 2014-02-26 NOTE — Progress Notes (Signed)
PT Cancellation Note  Patient Details Name: Geoffrey Richardson MRN: [id number] DOB: 1960-12-18   Cancelled Treatment:    Reason Eval/Treat Not Completed: Medical issues which prohibited therapy;Pain limiting ability to participate (per RN)   Claretha Cooper 02/26/2014, 3:32 PM

## 2014-02-26 NOTE — Progress Notes (Signed)
Progress Note   Geoffrey Richardson ZWC:[id number] DOB: 27-Oct-1960 DOA: 02/22/2014 PCP: Philis Fendt, MD   Brief Narrative:   Geoffrey Richardson is an 53 y.o. male a PMH of non-small cell lung cancer with recurrent malignant pleural effusion status post pleural catheter insertion 01/15/2013 (subsequently removed after became infected) and chemotherapy with carboplatin and Taxotere on 02/17/13 status post 6 cycles which completed on 06/02/13, recent hospital admission 01/25/14-02/03/14 for evaluation of abdominal pain and early satiety. CT scan of the chest, abdomen and pelvis done during that admission showed peritoneal caking and ascites. He had a 1.4 L paracentesis done 01/26/14. Plan was to followup with oncology as an outpatient for consideration of further chemotherapy if his performance status/nutrition improved, however he has failed to thrive and was readmitted 02/22/14 with uncontrolled cancer related pain from malignant ascites and shortness of breath from a malignant pleural effusion. Palliative care has been consulted, however the patient continues to want aggressive care despite his deteriorating status and poor prognosis.  Assessment/Plan:   Principal Problem:  Cancer associated pain secondary to malignant ascites and peritoneal implants  Palliative care consulted to recommend a more effective outpatient pain management strategy.  Paracentesis done to address tense ascites. 4.1 L removed 02/22/14. Reorder given worsening shortness of breath. Continue OxyContin (dose increased to 45 mg every 12 hours on 02/23/14), and oxycodone 5-10 mg every 4 hours for breakthrough pain. Dilaudid-HP switched back to every 2 hours for severe pain secondary to uncontrolled pain. Continue lidocaine patches, added by the palliative care team. PT ordered, however the patient has been too fatigued and dyspneic to participate. Patient continues to want aggressive care and full CODE STATUS.  Active Problems:  Benign  essential hypertension  Continue hydralazine and labetalol.  COPD (chronic obstructive pulmonary disease)/chronic respiratory failure with hypoxia / dyspnea  Continue Symbicort. Continue bronchodilators as needed.  Provide supplemental oxygen to keep saturations greater than 92%. Dyspneic overnight and required extra bronchodilator treatment.  Severe malnutrition   Pt meets criteria for severe MALNUTRITION in the context of chronic illness as evidenced by PO intake < 75% for > one month, 6.8% body weight loss in one month. Secondary to cancer related cachexia. Boost nutritional supplements ordered. Dietitian following.  Malignant pleural effusion  Effusion is loculated and cannot be drained per interventional radiology.  Metastatic Lung cancer  Seen by Dr. [name] 02/22/14. Poor prognosis with no recommendations for chemotherapy.  DVT prophylaxis  Lovenox ordered.  Code Status: Full.  Family Communication: [name] (close friend): [phone] updated at bedside 02/24/14. No family at bedside today. Disposition Plan: Home when stable.    IV Access:    Peripheral IV   Procedures and diagnostic studies:    Acute abdominal series 02/22/14: Stable chronic findings in the right hemithorax. No acute cardiopulmonary process. Mild progression of small bowel distention with ascites and peritoneal nodularity on CT concerning for underlying peritoneal carcinomatosis.  Ultrasound guided paracentesis 02/22/14: Successful ultrasound-guided paracentesis yielding 4.1 L of ascites.  Ultrasound of the chest 02/22/14: Chronic multiloculated pleural effusions. Patient had prior Pleurx catheter to the right side. This was removed due to loculation of the effusion and no output from the catheter. The patient may have a trapped right lung and thoracentesis could result in a pneumothorax and therefore was not done.   Medical Consultants:    Dr. Zola Button, Oncology.  Dr. Billey Chang,  Palliative Care   Other Consultants:    Physical therapy  Dietitian   Anti-Infectives:  None.  Subjective:   Geoffrey Richardson continues to have intermittent abdominal and back pain, relieved with IV pain medications. Appetite remains poor. His shortness of breath with activity. States his bowels moved the day before yesterday. Complains of hiccups.  Objective:    Filed Vitals:   02/25/14 1405 02/25/14 2031 02/26/14 0450 02/26/14 0610  BP: 122/89 131/98 126/90   Pulse: 110 111 112   Temp: 97.9 F (36.6 C) 97.8 F (36.6 C) 97.6 F (36.4 C)   TempSrc: Oral Oral Oral   Resp: 18 16 16    Height:      Weight:    86.9 kg (191 lb 9.3 oz)  SpO2: 97% 100% 100%     Intake/Output Summary (Last 24 hours) at 02/26/14 0804 Last data filed at 02/26/14 0600  Gross per 24 hour  Intake   1500 ml  Output    725 ml  Net    775 ml    Exam: Gen:  NAD, cachectic with hiccups Cardiovascular:  Tachycardic, No M/R/G Respiratory:  Lungs diminished in the bases Gastrointestinal:  Abdomen distended, remains tender  Extremities:  2+ edema  Data Reviewed:    Labs: Basic Metabolic Panel:  Recent Labs Lab 02/22/14 0922 02/23/14 0427  NA 139 140  K 4.4 4.5  CL 97 103  CO2 25 27  GLUCOSE 147* 105*  BUN 27* 16  CREATININE 0.78 0.64  CALCIUM 9.3 8.7   GFR Estimated Creatinine Clearance: 118.6 ml/min (by C-G formula based on Cr of 0.64). Liver Function Tests:  Recent Labs Lab 02/22/14 0922  AST 17  ALT 12  ALKPHOS 125*  BILITOT 0.4  PROT 7.8  ALBUMIN 2.6*    Recent Labs Lab 02/22/14 0922  LIPASE 23   CBC:  Recent Labs Lab 02/22/14 0922  WBC 9.0  NEUTROABS 7.6  HGB 12.3*  HCT 40.6  MCV 80.7  PLT 203   Sepsis Labs:  Recent Labs Lab 02/22/14 0922  WBC 9.0   Microbiology No results found for this or any previous visit (from the past 240 hour(s)).   Medications:   . budesonide-formoterol  1 puff Inhalation Daily  . enoxaparin (LOVENOX)  injection  40 mg Subcutaneous Q24H  . hydrALAZINE  25 mg Oral 3 times per day  . labetalol  100 mg Oral BID  . lactose free nutrition  237 mL Oral TID  . lidocaine  2 patch Transdermal q morning - 10a  . metoCLOPramide  10 mg Oral TID AC & HS  . multivitamin with minerals  1 tablet Oral Daily  . OxyCODONE  45 mg Oral Q12H  . pantoprazole  40 mg Oral Daily  . senna-docusate  1 tablet Oral Daily   Continuous Infusions: . sodium chloride 50 mL/hr at 02/24/14 1732    Time spent: 25 minutes.    LOS: 4 days   [facility] Pager [phone]. If unable to reach me by pager, please call my cell phone at [phone].  *Please refer to [email or web address], password [email or web address] to get updated schedule on who will round on this patient, as hospitalists switch teams weekly. If 7PM-7AM, please contact night-coverage at [email or web address], password [email or web address] for any overnight needs.  02/26/2014, 8:04 AM    **Disclaimer: This note was dictated with voice recognition software. Similar sounding words Richardson inadvertently be transcribed and this note may contain transcription errors which may not have been corrected upon publication of note.**

## 2014-02-27 ENCOUNTER — Other Ambulatory Visit: Payer: Self-pay | Admitting: Interventional Radiology

## 2014-02-27 DIAGNOSIS — G893 Neoplasm related pain (acute) (chronic): Secondary | ICD-10-CM

## 2014-02-27 DIAGNOSIS — R1013 Epigastric pain: Secondary | ICD-10-CM

## 2014-02-27 DIAGNOSIS — R180 Malignant ascites: Secondary | ICD-10-CM

## 2014-02-27 DIAGNOSIS — C3490 Malignant neoplasm of unspecified part of unspecified bronchus or lung: Secondary | ICD-10-CM

## 2014-02-27 LAB — GLUCOSE, CAPILLARY: GLUCOSE-CAPILLARY: 97 mg/dL (ref 70–99)

## 2014-02-27 MED ORDER — MAGIC MOUTHWASH
10.0000 mL | Freq: Four times a day (QID) | ORAL | Status: DC
  Administered 2014-02-27 – 2014-03-08 (×14): 10 mL via ORAL
  Filled 2014-02-27 (×40): qty 10

## 2014-02-27 MED ORDER — OXYCODONE HCL ER 15 MG PO T12A
45.0000 mg | EXTENDED_RELEASE_TABLET | Freq: Three times a day (TID) | ORAL | Status: DC
  Administered 2014-02-27 – 2014-02-28 (×5): 45 mg via ORAL
  Filled 2014-02-27 (×5): qty 3

## 2014-02-27 MED ORDER — FLUCONAZOLE IN SODIUM CHLORIDE 200-0.9 MG/100ML-% IV SOLN
200.0000 mg | Freq: Every day | INTRAVENOUS | Status: DC
  Administered 2014-02-27 – 2014-03-05 (×7): 200 mg via INTRAVENOUS
  Filled 2014-02-27 (×7): qty 100

## 2014-02-27 MED ORDER — HYDROMORPHONE HCL PF 1 MG/ML IJ SOLN
0.5000 mg | INTRAMUSCULAR | Status: DC | PRN
  Administered 2014-02-27 – 2014-02-28 (×6): 0.5 mg via INTRAVENOUS
  Filled 2014-02-27 (×6): qty 1

## 2014-02-27 MED ORDER — HYDROMORPHONE HCL PF 1 MG/ML IJ SOLN
1.0000 mg | INTRAMUSCULAR | Status: AC | PRN
  Administered 2014-02-27 (×4): 1 mg via INTRAVENOUS
  Filled 2014-02-27 (×4): qty 1

## 2014-02-27 NOTE — Progress Notes (Signed)
PT Cancellation Note  Patient Details Name: Geoffrey Richardson MRN: [id number] DOB: October 17, 1960   Cancelled Treatment:    Reason Eval/Treat Not Completed: Medical issues which prohibited therapy (pain and respiratory distress limiting.)  Will check back 7/21. Pt may not be able to participate with so many medical issues. RN aware,   Claretha Cooper 02/27/2014, 3:06 PM Tresa Endo PT [phone]

## 2014-02-27 NOTE — Progress Notes (Signed)
Progress Note from the Palliative Medicine Team at [facility]: I spoke with Geoffrey Richardson and his friend Geoffrey Richardson at bedside today. He had another 2.5L paracentesis yesterday. I spoke with Dr. Maceo Pro and Dr. Doyle Askew who both agree a drain for ascites might be a beneficial for comfort. I spoke with Geoffrey Richardson and Geoffrey Richardson about this option and they wish to move forward with drain placement. I have also changed Oxycontin 45 mg from every 12 hours to every 8 hours to better manage pain. No change in goals - he remains very hopeful and desires full aggressive care.    Objective: Allergies  Allergen Reactions  . Morphine And Related Nausea And Vomiting and Other (See Comments)    dizziness   Scheduled Meds: . budesonide-formoterol  1 puff Inhalation Daily  . enoxaparin (LOVENOX) injection  40 mg Subcutaneous Q24H  . hydrALAZINE  25 mg Oral 3 times per day  . labetalol  100 mg Oral BID  . lactose free nutrition  237 mL Oral TID  . lidocaine  2 patch Transdermal q morning - 10a  . metoCLOPramide  10 mg Oral TID AC & HS  . multivitamin with minerals  1 tablet Oral Daily  . OxyCODONE  45 mg Oral 3 times per day  . pantoprazole  40 mg Oral Daily  . senna-docusate  1 tablet Oral Daily   Continuous Infusions: . sodium chloride 50 mL/hr at 02/27/14 0410   PRN Meds:.acetaminophen, acetaminophen, albuterol, ALPRAZolam, alum & mag hydroxide-simeth, chlorproMAZINE, HYDROmorphone (DILAUDID) injection, HYDROmorphone (DILAUDID) injection, ondansetron (ZOFRAN) IV, ondansetron, oxyCODONE, polyethylene glycol  BP 118/77  Pulse 109  Temp(Src) 98.3 F (36.8 C) (Oral)  Resp 16  Ht 6' (1.829 m)  Wt 84.8 kg (186 lb 15.2 oz)  BMI 25.35 kg/m2  SpO2 92%   PPS: 30%     Intake/Output Summary (Last 24 hours) at 02/27/14 0916 Last data filed at 02/27/14 0316  Gross per 24 hour  Intake   1160 ml  Output    900 ml  Net    260 ml      LBM: 7/13      Physical Exam:  General: NAD, cachectic, ill  appearing  HEENT: Severe temporal muscle wasting, no JVD, oral mucosa moist + thrush Chest: CTA throughout, diminished bases, no labored breathing  CVS: Tachycardic  Abdomen: Distended, tender, +BS  Ext: MAE, 2+ BLE edema, warm to touch  Neuro: Awake, alert, oriented x 3  Labs: CBC    Component Value Date/Time   WBC 9.0 02/22/2014 0922   WBC 6.1 12/13/2013 0803   RBC 5.03 02/22/2014 0922   RBC 4.53 12/13/2013 0803   HGB 12.3* 02/22/2014 0922   HGB 11.3* 12/13/2013 0803   HCT 40.6 02/22/2014 0922   HCT 37.0* 12/13/2013 0803   PLT 203 02/22/2014 0922   PLT 267 12/13/2013 0803   MCV 80.7 02/22/2014 0922   MCV 81.8 12/13/2013 0803   MCH 24.5* 02/22/2014 0922   MCH 24.9* 12/13/2013 0803   MCHC 30.3 02/22/2014 0922   MCHC 30.5* 12/13/2013 0803   RDW 17.9* 02/22/2014 0922   RDW 18.5* 12/13/2013 0803   LYMPHSABS 0.9 02/22/2014 0922   LYMPHSABS 1.5 12/13/2013 0803   MONOABS 0.6 02/22/2014 0922   MONOABS 0.4 12/13/2013 0803   EOSABS 0.0 02/22/2014 0922   EOSABS 0.0 12/13/2013 0803   BASOSABS 0.0 02/22/2014 0922   BASOSABS 0.0 12/13/2013 0803    BMET    Component Value Date/Time   NA  140 02/23/2014 0427   NA 144 12/13/2013 0803   K 4.5 02/23/2014 0427   K 4.1 12/13/2013 0803   CL 103 02/23/2014 0427   CL 103 01/19/2013 1008   CO2 27 02/23/2014 0427   CO2 26 12/13/2013 0803   GLUCOSE 105* 02/23/2014 0427   GLUCOSE 109 12/13/2013 0803   GLUCOSE 129* 01/19/2013 1008   BUN 16 02/23/2014 0427   BUN 16.1 12/13/2013 0803   CREATININE 0.64 02/23/2014 0427   CREATININE 0.8 12/13/2013 0803   CALCIUM 8.7 02/23/2014 0427   CALCIUM 9.5 12/13/2013 0803   GFRNONAA >90 02/23/2014 0427   GFRAA >90 02/23/2014 0427    CMP     Component Value Date/Time   NA 140 02/23/2014 0427   NA 144 12/13/2013 0803   K 4.5 02/23/2014 0427   K 4.1 12/13/2013 0803   CL 103 02/23/2014 0427   CL 103 01/19/2013 1008   CO2 27 02/23/2014 0427   CO2 26 12/13/2013 0803   GLUCOSE 105* 02/23/2014 0427   GLUCOSE 109 12/13/2013 0803   GLUCOSE 129* 01/19/2013 1008   BUN 16  02/23/2014 0427   BUN 16.1 12/13/2013 0803   CREATININE 0.64 02/23/2014 0427   CREATININE 0.8 12/13/2013 0803   CALCIUM 8.7 02/23/2014 0427   CALCIUM 9.5 12/13/2013 0803   PROT 7.8 02/22/2014 0922   PROT 8.3 12/13/2013 0803   ALBUMIN 2.6* 02/22/2014 0922   ALBUMIN 3.1* 12/13/2013 0803   AST 17 02/22/2014 0922   AST 10 12/13/2013 0803   ALT 12 02/22/2014 0922   ALT <6 12/13/2013 0803   ALKPHOS 125* 02/22/2014 0922   ALKPHOS 94 12/13/2013 0803   BILITOT 0.4 02/22/2014 0922   BILITOT 0.21 12/13/2013 0803   GFRNONAA >90 02/23/2014 0427   GFRAA >90 02/23/2014 0427    Assessment and Plan:  1. Code Status: FULL 2. Symptom Control:  1. Anxiety: Alprazolam 0.25 mg TID prn.  2. Nausea: Ondansetron prn.  3. Bowel Regimen: Senokot-S daily. Miralax prn.  4. Pain:  1. Lidoderm patches under scapulas for back pain.  2. Oxycontin increased 45 mg every 8 hours.  3. Oxycodone 10 mg every 4 hours prn.  4. Dilaudid 0.5 mg every 3 hours prn and nursing to utilize ONLY after oxycodone ineffective. HOLD FOR SEDATION.  3. Psycho/Social: Emotional support provided to patient and family/friends.  4. Spiritual: Chaplain following.  5. Disposition: To be determined on outcomes.     Time In Time Out Total Time Spent with Patient Total Overall Time  0930 1000 44min 65min    Greater than 50%  of this time was spent counseling and coordinating care related to the above assessment and plan.  Vinie Sill, NP Palliative Medicine Team Pager # [phone] (M-F 8a-5p) Team Phone # [phone] (Nights/Weekends)

## 2014-02-27 NOTE — Progress Notes (Signed)
Patient ID: Geoffrey Richardson, male   DOB: 1961-02-10, 53 y.o.   MRN: [id number] [facility] PROGRESS NOTE  Geoffrey Richardson AYT:[id number] DOB: Jul 19, 1961 DOA: 02/22/2014 PCP: Philis Fendt, MD   Brief Narrative:   53 y.o. male a PMH of non-small cell lung cancer with recurrent malignant pleural effusion status post pleural catheter insertion 01/15/2013 (subsequently removed after became infected) and chemotherapy with carboplatin and Taxotere on 02/17/13 status post 6 cycles which completed on 06/02/13, recent hospital admission 01/25/14-02/03/14 for evaluation of abdominal pain and early satiety. CT scan of the chest, abdomen and pelvis done during that admission showed ascites. He had a 1.4 L paracentesis done 01/26/14. Plan was to followup with oncology as an outpatient for consideration of further chemotherapy if his performance status/nutrition improved, however he has failed to thrive and was readmitted 02/22/14 with uncontrolled cancer related pain from malignant ascites and shortness of breath from a malignant pleural effusion. Palliative care has been consulted, however the patient continues to want aggressive care despite his deteriorating status and poor prognosis.   Assessment/Plan:   Principal Problem:  Cancer associated pain secondary to malignant ascites and peritoneal implants  Palliative care consulted to recommend a more effective outpatient pain management strategy.  Paracentesis done to address tense ascites. 4.1 L removed 02/22/14. 2 more L removed July 19th, 2015.  Continue OxyContin (dose increased to 45 mg every 8 hours on 02/23/14), and oxycodone 5-10 mg every 4 hours for breakthrough pain. Dilaudid-HP switched back to every 2 hours for severe pain secondary to uncontrolled pain.  Continue lidocaine patches, added by the palliative care team.  PT ordered, however the patient has been too fatigued and dyspneic to participate.  Patient continues to want aggressive care and full CODE  STATUS. IR consult requested for palliative drain placement for ascites  Active Problems:  Oral Thrush  Place on Diflucan and Magic mouthwash  Benign essential hypertension  Continue hydralazine and labetalol. COPD (chronic obstructive pulmonary disease)/chronic respiratory failure with hypoxia / dyspnea  Continue Symbicort. Continue bronchodilators as needed.  Provide supplemental oxygen to keep saturations greater than 92%.  Pt denies shortness of breath this AM  Severe malnutrition   Pt meets criteria for severe MALNUTRITION in the context of chronic illness as evidenced by PO intake < 75% for > one month, 6.8% body weight loss in one month. Secondary to cancer related cachexia. Boost nutritional supplements ordered.  Dietitian following and assistance is appreciated  Malignant pleural effusion  Effusion is loculated and cannot be drained per interventional radiology. Metastatic Lung cancer  Seen by Dr. [name] 02/22/14. Poor prognosis with no recommendations for chemotherapy. PCT involved and assistance is appreciated  DVT prophylaxis  Lovenox ordered.  Code Status: Full.  Family Communication: Geoffrey Richardson (close friend): [phone] updated at bedside 02/24/14. No family at bedside today.  Disposition Plan: Home when stable.   IV Access:   Peripheral IV Procedures and diagnostic studies:   Acute abdominal series 02/22/14: Stable chronic findings in the right hemithorax. No acute cardiopulmonary process. Mild progression of small bowel distention with ascites and peritoneal nodularity on CT concerning for underlying peritoneal carcinomatosis.  Ultrasound guided paracentesis 02/22/14: Successful ultrasound-guided paracentesis yielding 4.1 L of ascites.  Ultrasound of the chest 02/22/14: Chronic multiloculated pleural effusions. Patient had prior Pleurx catheter to the right side. This was removed due to loculation of the effusion and no output from the catheter. The patient may have a  trapped right lung and thoracentesis could result in a pneumothorax and  therefore was not done. Medical Consultants:   Dr. Zola Button, Oncology.  Dr. Billey Chang, [facility]  Other Consultants:   Physical therapy  Dietitian Anti-Infectives:   None.  HPI/Subjective: No events overnight.   Objective: Filed Vitals:   02/26/14 1300 02/26/14 2126 02/27/14 0539 02/27/14 1015  BP: 109/72 121/86 118/77 120/87  Pulse: 104 122 109 115  Temp: 97.6 F (36.4 C) 97.4 F (36.3 C) 98.3 F (36.8 C) 98.5 F (36.9 C)  TempSrc: Oral Axillary Oral Oral  Resp: 18 18 16 16   Height:      Weight:   84.8 kg (186 lb 15.2 oz)   SpO2: 100% 100% 92% 94%    Intake/Output Summary (Last 24 hours) at 02/27/14 1358 Last data filed at 02/27/14 0316  Gross per 24 hour  Intake   1040 ml  Output    800 ml  Net    240 ml    Exam:   General:  Pt is alert, follows commands appropriately, not in acute distress, cachectic   Cardiovascular: Regular rhythm, tachycardic, S1/S2, no murmurs, no rubs, no gallops  Respiratory: Poor inspiratory effort, diminished air movement at bases with bibasilar crackles   Abdomen: Soft, tender in epigastric area, slightly distended, bowel sounds present, no guarding  Extremities: +2 bilateral LE pitting edema, pulses DP and PT palpable bilaterally  Data Reviewed: Basic Metabolic Panel:  Recent Labs Lab 02/22/14 0922 02/23/14 0427  NA 139 140  K 4.4 4.5  CL 97 103  CO2 25 27  GLUCOSE 147* 105*  BUN 27* 16  CREATININE 0.78 0.64  CALCIUM 9.3 8.7   Liver Function Tests:  Recent Labs Lab 02/22/14 0922  AST 17  ALT 12  ALKPHOS 125*  BILITOT 0.4  PROT 7.8  ALBUMIN 2.6*    Recent Labs Lab 02/22/14 0922  LIPASE 23   CBC:  Recent Labs Lab 02/22/14 0922  WBC 9.0  NEUTROABS 7.6  HGB 12.3*  HCT 40.6  MCV 80.7  PLT 203   CBG:  Recent Labs Lab 02/27/14 0727  GLUCAP 97   Scheduled Meds: .  budesonide-formoterol  1 puff Inhalation Daily  . enoxaparin (LOVENOX) injection  40 mg Subcutaneous Q24H  . fluconazole (DIFLUCAN) IV  200 mg Intravenous Daily  . hydrALAZINE  25 mg Oral 3 times per day  . labetalol  100 mg Oral BID  . lactose free nutrition  237 mL Oral TID  . lidocaine  2 patch Transdermal q morning - 10a  . magic mouthwash  10 mL Oral QID  . metoCLOPramide  10 mg Oral TID AC & HS  . multivitamin with minerals  1 tablet Oral Daily  . OxyCODONE  45 mg Oral 3 times per day  . pantoprazole  40 mg Oral Daily  . senna-docusate  1 tablet Oral Daily   Continuous Infusions: . sodium chloride 50 mL/hr at 02/27/14 0410   Faye Ramsay, MD  [facility] Pager [phone]  If 7PM-7AM, please contact night-coverage [email or web address] Password [email or web address] 02/27/2014, 1:58 PM   LOS: 5 days

## 2014-02-27 NOTE — Progress Notes (Signed)
Patient talked about the pain he has been in. He said someone had just given him something for it, but he was waiting for it to work.  Offered chaplain support. He said he has the advanced directive paperwork but does not feel up to completing it at the moment. He requested prayer but wasn't up for prayer at this time.  Presence; empathetic listening.   Geoffrey Gore, PhD, [place]

## 2014-02-28 DIAGNOSIS — R1013 Epigastric pain: Secondary | ICD-10-CM

## 2014-02-28 DIAGNOSIS — G893 Neoplasm related pain (acute) (chronic): Secondary | ICD-10-CM

## 2014-02-28 DIAGNOSIS — C3490 Malignant neoplasm of unspecified part of unspecified bronchus or lung: Secondary | ICD-10-CM

## 2014-02-28 DIAGNOSIS — Z85118 Personal history of other malignant neoplasm of bronchus and lung: Secondary | ICD-10-CM

## 2014-02-28 DIAGNOSIS — Z515 Encounter for palliative care: Secondary | ICD-10-CM

## 2014-02-28 DIAGNOSIS — R180 Malignant ascites: Secondary | ICD-10-CM

## 2014-02-28 LAB — CBC
HCT: 40.7 % (ref 39.0–52.0)
HEMOGLOBIN: 11.8 g/dL — AB (ref 13.0–17.0)
MCH: 24.1 pg — AB (ref 26.0–34.0)
MCHC: 29 g/dL — ABNORMAL LOW (ref 30.0–36.0)
MCV: 83.2 fL (ref 78.0–100.0)
PLATELETS: 203 10*3/uL (ref 150–400)
RBC: 4.89 MIL/uL (ref 4.22–5.81)
RDW: 18.1 % — ABNORMAL HIGH (ref 11.5–15.5)
WBC: 7.3 10*3/uL (ref 4.0–10.5)

## 2014-02-28 LAB — BASIC METABOLIC PANEL
ANION GAP: 13 (ref 5–15)
BUN: 18 mg/dL (ref 6–23)
CO2: 25 meq/L (ref 19–32)
Calcium: 9 mg/dL (ref 8.4–10.5)
Chloride: 102 mEq/L (ref 96–112)
Creatinine, Ser: 0.7 mg/dL (ref 0.50–1.35)
GFR calc Af Amer: >90 mL/min (ref >90)
GFR calc non Af Amer: >90 mL/min (ref >90)
GLUCOSE: 113 mg/dL — AB (ref 70–99)
POTASSIUM: 4.4 meq/L (ref 3.7–5.3)
SODIUM: 140 meq/L (ref 137–147)

## 2014-02-28 MED ORDER — BISACODYL 10 MG RE SUPP
10.0000 mg | Freq: Every day | RECTAL | Status: DC | PRN
  Administered 2014-03-03: 10 mg via RECTAL
  Filled 2014-02-28: qty 1

## 2014-02-28 MED ORDER — HYDROMORPHONE HCL PF 1 MG/ML IJ SOLN
0.5000 mg | INTRAMUSCULAR | Status: DC | PRN
  Administered 2014-03-01 – 2014-03-06 (×40): 0.5 mg via INTRAVENOUS
  Filled 2014-02-28 (×40): qty 1

## 2014-02-28 MED ORDER — SORBITOL 70 % SOLN
30.0000 mL | Freq: Once | Status: AC
  Administered 2014-02-28: 30 mL via ORAL
  Filled 2014-02-28: qty 30

## 2014-02-28 MED ORDER — CEFAZOLIN SODIUM-DEXTROSE 2-3 GM-% IV SOLR
2.0000 g | INTRAVENOUS | Status: AC
  Administered 2014-03-01: 2 g via INTRAVENOUS
  Filled 2014-02-28: qty 50

## 2014-02-28 MED ORDER — BISACODYL 10 MG RE SUPP: 10.0000 mg | Freq: Every day | RECTAL | Status: DC | PRN

## 2014-02-28 MED ORDER — BISACODYL 10 MG RE SUPP: 10.0000 mg | Freq: Once | RECTAL | Status: DC

## 2014-02-28 NOTE — Progress Notes (Signed)
Clinical Social Work Department BRIEF PSYCHOSOCIAL ASSESSMENT 02/28/2014  Patient:  Geoffrey Richardson     Account Number:  [id number]     Admit date:  02/22/2014  Clinical Social Worker:  Ulyess Blossom  Date/Time:  02/28/2014 02:30 PM  Referred by:  Physician  Date Referred:  02/28/2014 Referred for  Advanced Directives   Other Referral:   Interview type:  Patient Other interview type:    PSYCHOSOCIAL DATA Living Status:  SIGNIFICANT OTHER Admitted from facility:   Level of care:   Primary support name:  [name]/significant [phone] Primary support relationship to patient:  SPOUSE Degree of support available:   adequate    CURRENT CONCERNS Current Concerns  Other - See comment   Other Concerns:   advanced directives    SOCIAL WORK ASSESSMENT / PLAN CSW received referral for Advanced Directives. Per referral, pt had completed packet and needed packet notarized.    CSW was stopped by pt brother in hallway. Pt brother stated that pt wished to have document notarized before his surgery tomorrow. CSW expressed understanding and ensured pt brother that CSW will assist pt in completing document.    CSW met with pt at bedside. CSW introduced self and explained role. Pt had advanced directive packet at bedside. CSW reviewed packet with pt to ensure all pages pt wished to have completed were completed. CSW arranged notary and two witnesses. Pt advanced directive packet notarized. CSW provided pt with original and two copies. Copy placed in pt shadow chart. CSW provided support to pt surrounding pt surgery tomorrow.    No further social work needs identified at this time.    CSW signing off.   Assessment/plan status:  No Further Intervention Required Other assessment/ plan:   Information/referral to community resources:   Ecologist completed    PATIENT'S/FAMILY'S RESPONSE TO PLAN OF CARE: Pt alert and oriented x 4. Pt a bit drowsy secondary to  medications. Pt appreciative of assisting with completion of Advanced Directive packet prior to his surgery tomorrow.    [name], MSW, [place] [facility] [phone]

## 2014-02-28 NOTE — Consult Note (Signed)
I have reviewed this case with our NP and agree with the Assessment and Plan as stated.  Trinadee Verhagen L. Shailyn Weyandt, MD MBA [facility] Pall[facility] Phone: [phone] Pager: [phone]   

## 2014-02-28 NOTE — Progress Notes (Addendum)
Patient ID: Geoffrey Richardson, male   DOB: [birth year], 53 y.o.   MRN: [id number]  [facility] PROGRESS NOTE  Dream Nodal [id number] DOB: 1961/04/21 DOA: 02/22/2014 PCP: Philis Fendt, MD  Brief Narrative:   53 y.o. male a PMH of non-small cell lung cancer with recurrent malignant pleural effusion status post pleural catheter insertion 01/15/2013 (subsequently removed after became infected) and chemotherapy with carboplatin and Taxotere on 02/17/13 status post 6 cycles which completed on 06/02/13, recent hospital admission 01/25/14-02/03/14 for evaluation of abdominal pain and early satiety. CT scan of the chest, abdomen and pelvis done during that admission showed ascites. He had a 1.4 L paracentesis done 01/26/14. Plan was to followup with oncology as an outpatient for consideration of further chemotherapy if his performance status/nutrition improved, however he has failed to thrive and was readmitted 02/22/14 with uncontrolled cancer related pain from malignant ascites and shortness of breath from a malignant pleural effusion. Palliative care has been consulted, however the patient continues to want aggressive care despite his deteriorating status and poor prognosis.   Assessment/Plan:   Principal Problem:  Cancer associated pain secondary to malignant ascites and peritoneal implants   Palliative care consulted to recommend a more effective outpatient pain management strategy.   Paracentesis done to address tense ascites. 4.1 L removed 02/22/14. 2.5 more L removed July 19th, 2015.   Continue OxyContin (dose increased to 45 mg every 8 hours on 02/23/14), and oxycodone 5-10 mg every 4 hours for breakthrough pain. Dilaudid-HP switched back to every 2 hours for severe pain secondary to uncontrolled pain.   Plan on placement of a tunneled peritoneal catheter  Continue lidocaine patches, added by the palliative care team.   PT ordered, however the patient has been too fatigued and dyspneic to  participate.   Patient continues to want aggressive care and full CODE STATUS.   IR consult requested for palliative drain placement for ascites  Active Problems:  Oral Thrush   Placed on Diflucan and Magic mouthwash   Continue diflucan day #2 Benign essential hypertension   Continue hydralazine and labetalol. COPD (chronic obstructive pulmonary disease)/chronic respiratory failure with hypoxia / dyspnea   Continue Symbicort. Continue bronchodilators as needed.   Provide supplemental oxygen to keep saturations greater than 92%.   Pt denies shortness of breath this AM  Severe malnutrition   Pt meets criteria for severe MALNUTRITION in the context of chronic illness as evidenced by PO intake < 75% for > one month, 6.8% body weight loss in one month.  Secondary to cancer related cachexia. Boost nutritional supplements ordered.   Dietitian following and assistance is appreciated  Malignant pleural effusion   Effusion is loculated and cannot be drained per interventional radiology. Functional quadriplegia  Secondary to nature of the malignancy and progressive decline  Ambulate if pt able to tolerate  Metastatic Lung cancer   Seen by Dr. [name] 02/22/14. Poor prognosis with no recommendations for chemotherapy.   PCT involved and assistance is appreciated  DVT prophylaxis   Lovenox ordered.  Code Status: Full.  Family Communication: [name] (close friend): [phone] updated at bedside 02/24/14. No family at bedside today.  Disposition Plan: Home when stable.   IV Access:   Peripheral IV Procedures and diagnostic studies:   Acute abdominal series 02/22/14: Stable chronic findings in the right hemithorax. No acute cardiopulmonary process. Mild progression of small bowel distention with ascites and peritoneal nodularity on CT concerning for underlying peritoneal carcinomatosis.  Ultrasound guided paracentesis 02/22/14: Successful ultrasound-guided paracentesis yielding 4.1  L of ascites.  Ultrasound of the chest 02/22/14: Chronic multiloculated pleural effusions. Patient had prior Pleurx catheter to the right side. This was removed due to loculation of the effusion and no output from the catheter. The patient may have a trapped right lung and thoracentesis could result in a pneumothorax and therefore was not done. Medical Consultants:   Dr. Zola Button, Oncology.  Dr. Billey Chang, [facility]  Other Consultants:   Physical therapy  Dietitian Anti-Infectives:   None.  Leisa Lenz, MD  [facility] Pager [phone]  If 7PM-7AM, please contact night-coverage [email or web address] Password [email or web address] 02/28/2014, 10:59 AM   LOS: 6 days   HPI/Subjective: No acute overnight events.  Objective: Filed Vitals:   02/27/14 1400 02/27/14 2201 02/28/14 0524 02/28/14 1033  BP: 107/79 126/87 111/81   Pulse: 103 119 117   Temp: 98.1 F (36.7 C) 98.2 F (36.8 C) 98.2 F (36.8 C)   TempSrc: Oral Oral Oral   Resp: 18 16 18    Height:      Weight:   85.9 kg (189 lb 6 oz)   SpO2: 100% 100% 95% 98%    Intake/Output Summary (Last 24 hours) at 02/28/14 1059 Last data filed at 02/28/14 0900  Gross per 24 hour  Intake   1080 ml  Output    200 ml  Net    880 ml    Exam:   General:  Pt is alert, follows commands appropriately, not in acute distress, oral thrush noted entire tongue   Cardiovascular: Regular rate and rhythm, S1/S2, no murmurs  Respiratory: Clear to auscultation bilaterally, no wheezing, no crackles, no rhonchi  Abdomen: ender in epigastric area, distended, bowel sounds present  Extremities: +2 bilateral LE pitting edema, pulses DP and PT palpable bilaterally  Neuro: Grossly nonfocal  Data Reviewed: Basic Metabolic Panel:  Recent Labs Lab 02/22/14 0922 02/23/14 0427 02/28/14 0003  NA 139 140 140  K 4.4 4.5 4.4  CL 97 103 102  CO2 25 27 25   GLUCOSE 147* 105* 113*  BUN 27* 16 18  CREATININE  0.78 0.64 0.70  CALCIUM 9.3 8.7 9.0   Liver Function Tests:  Recent Labs Lab 02/22/14 0922  AST 17  ALT 12  ALKPHOS 125*  BILITOT 0.4  PROT 7.8  ALBUMIN 2.6*    Recent Labs Lab 02/22/14 0922  LIPASE 23   No results found for this basename: AMMONIA,  in the last 168 hours CBC:  Recent Labs Lab 02/22/14 0922 02/28/14 0003  WBC 9.0 7.3  NEUTROABS 7.6  --   HGB 12.3* 11.8*  HCT 40.6 40.7  MCV 80.7 83.2  PLT 203 203   CBG:  Recent Labs Lab 02/27/14 0727  GLUCAP 97    Studies: US Paracentesis  02/28/2014   CLINICAL DATA:  Abdominal ascites ; shortness of breath  EXAM: ULTRASOUND GUIDED right lower quadrant PARACENTESIS  COMPARISON:  Previous paracentesis  PROCEDURE: An ultrasound guided paracentesis was thoroughly discussed with the patient and questions answered. The benefits, risks, alternatives and complications were also discussed. The patient understands and wishes to proceed with the procedure. Written consent was obtained.  Ultrasound was performed to localize and mark an adequate pocket of fluid in the right lower quadrant of the abdomen. The area was then prepped and draped in the normal sterile fashion. 1% Lidocaine was used for local anesthesia. Under ultrasound guidance a 19 gauge Yueh catheter was introduced. Paracentesis was performed. The catheter was removed and a  dressing applied.  Complications: None.  FINDINGS: A total of approximately 2.5 L of amber fluid was removed. A fluid sample was not sent for laboratory analysis.  IMPRESSION: Successful ultrasound guided paracentesis yielding 2.5 L of ascites.  Read by: Moshe Cipro   Electronically Signed   By: Aletta Edouard M.D.   On: 02/27/2014 06:40    Scheduled Meds: . budesonide-formoterol  1 puff Inhalation Daily  . [START ON 03/01/2014]  ceFAZolin (ANCEF) IV  2 g Intravenous On Call  . enoxaparin (LOVENOX) injection  40 mg Subcutaneous Q24H  . fluconazole (DIFLUCAN) IV   200 mg Intravenous Daily  . hydrALAZINE  25 mg Oral 3 times per day  . labetalol  100 mg Oral BID  . lactose free nutrition  237 mL Oral TID  . lidocaine  2 patch Transdermal q morning - 10a  . magic mouthwash  10 mL Oral QID  . metoCLOPramide  10 mg Oral TID AC & HS  . multivitamin with minerals  1 tablet Oral Daily  . OxyCODONE  45 mg Oral 3 times per day  . pantoprazole  40 mg Oral Daily  . senna-docusate  1 tablet Oral Daily   Continuous Infusions: . sodium chloride 50 mL/hr at 02/28/14 0034

## 2014-02-28 NOTE — Progress Notes (Signed)
Agree.  Will proceed with palliative tunneled peritoneal drainage catheter tomorrow.

## 2014-02-28 NOTE — Progress Notes (Signed)
Physical Therapy Discharge Patient Details Name: Keymarion Bearman MRN: [id number] DOB: 09-Jun-1961 Today's Date: 02/28/2014 Time:  -     Patient discharged from PT services secondary to medical decline - will need to re-order PT to resume therapy services.MD notes reviewed. Attempted PT 7/20 but pt very dyspneic and  In pain. Patient unable to participate at this time.  Progress and discharge plan discussed with patient and/or caregiver: NA  GP     Marcelino Freestone PT [phone]  02/28/2014, 1:39 PM

## 2014-02-28 NOTE — Progress Notes (Signed)
Patient ID: Geoffrey Richardson, male   DOB: August 04, 1961, 53 y.o.   MRN: [id number] Request received from [facility] for placement of a tunneled peritoneal catheter in pt with history of metastatic [place] lung carcinoma and symptomatic recurrent malignant ascites. Pt had right pleurx placed in 01/2013 for malignant effusion but removed secondary to infection. Pt has continued to decline clinically and was readmitted 02/22/14 with uncontrolled cancer related pain from malignant ascites and shortness of breath from a malignant pleural effusion. Palliative care has been consulted, however the patient continues to want aggressive care despite his deteriorating status and poor prognosis. Imaging studies were reviewed by Dr. Laurence Ferrari. Pt has had 3 paracenteses since 01/2014, last on 7/19 yielding 2.5 liters amber fluid. Additional PMH as below. Exam: cachectic BM, awake/alert; oral thrush noted; chest- dim BS bases, > on right; heart- tachy but regular; abd- dist/tense, diffusely tender,+BS; ext- 2-3+ bilat LE edema.    Filed Vitals:   02/27/14 1015 02/27/14 1400 02/27/14 2201 02/28/14 0524  BP: 120/87 107/79 126/87 111/81  Pulse: 115 103 119 117  Temp: 98.5 F (36.9 C) 98.1 F (36.7 C) 98.2 F (36.8 C) 98.2 F (36.8 C)  TempSrc: Oral Oral Oral Oral  Resp: 16 18 16 18   Height:      Weight:    189 lb 6 oz (85.9 kg)  SpO2: 94% 100% 100% 95%   Past Medical History  Diagnosis Date  . Tobacco abuse     Began age 59  . Pleural effusion on right   . COPD (chronic obstructive pulmonary disease)   . Hypertension     pt states it fluctuates but not on any medications  . Emphysema   . Shortness of breath     lying/sitting/exertion  . Headache(784.0)     occasionally  . GERD (gastroesophageal reflux disease)     takes Protonix daily  . Nocturia   . Hypothyroidism     but not on medication at the present time  . Insomnia   . Lung cancer 02/04/2013   Past Surgical History  Procedure Laterality Date  . Appendectomy       as a child  . Video assisted thoracoscopy (vats) w/talc pleuadesis Right 01/14/2013    Procedure: VIDEO ASSISTED THORACOSCOPY (VATS) W/TALC PLEUADESIS;  Surgeon: Ivin Poot, MD;  Location: [place];  Service: Thoracic;  Laterality: Right;  . Chest tube insertion Right 01/14/2013    Procedure: INSERTION PLEURAL DRAINAGE CATHETER;  Surgeon: Ivin Poot, MD;  Location: [place];  Service: Thoracic;  Laterality: Right;  . Removal of pleural drainage catheter N/A 04/22/2013    Procedure: MINOR REMOVAL OF PLEURAL DRAINAGE CATHETER;  Surgeon: Ivin Poot, MD;  Location: [place];  Service: Thoracic;  Laterality: N/A;  . Esophagogastroduodenoscopy N/A 01/27/2014    Procedure: ESOPHAGOGASTRODUODENOSCOPY (EGD);  Surgeon: Beryle Beams, MD;  Location: [facility] ENDOSCOPY;  Service: Endoscopy;  Laterality: N/A;   [name] Chest  02/22/2014   CLINICAL DATA:  Metastatic lung cancer. Shortness of breath. Abdominal distention. Known chronic right pleural effusion. Request possible thoracentesis.  EXAM: CHEST ULTRASOUND  COMPARISON:  Chest x-ray from 02/22/2014, CT of the chest from 01/25/2014  FINDINGS: Limited ultrasound of the right chest finds a multiloculated effusion consistent with prior image findings. There also appears to be thickened pleura or rind surrounding the effusion. This is not amenable to percutaneous thoracentesis.  IMPRESSION: Chronic multiloculated pleural effusion. Patient had prior PleurX catheter to the right side. This apparently had to be removed due  to loculation of the effusion and no output from the catheter. The patient may have a trapped right lung and thoracentesis could result in a pneumothorax.  Read by:  Ascencion Dike, PA-C   Electronically Signed   By: Markus Daft M.D.   On: 02/22/2014 15:47   US Paracentesis  02/28/2014   CLINICAL DATA:  Abdominal ascites ; shortness of breath  EXAM: ULTRASOUND GUIDED right lower quadrant PARACENTESIS  COMPARISON:  Previous paracentesis  PROCEDURE: An  ultrasound guided paracentesis was thoroughly discussed with the patient and questions answered. The benefits, risks, alternatives and complications were also discussed. The patient understands and wishes to proceed with the procedure. Written consent was obtained.  Ultrasound was performed to localize and mark an adequate pocket of fluid in the right lower quadrant of the abdomen. The area was then prepped and draped in the normal sterile fashion. 1% Lidocaine was used for local anesthesia. Under ultrasound guidance a 19 gauge Yueh catheter was introduced. Paracentesis was performed. The catheter was removed and a dressing applied.  Complications: None.  FINDINGS: A total of approximately 2.5 L of amber fluid was removed. A fluid sample was not sent for laboratory analysis.  IMPRESSION: Successful ultrasound guided paracentesis yielding 2.5 L of ascites.  Read by: Moshe Cipro   Electronically Signed   By: Aletta Edouard M.D.   On: 02/27/2014 06:40   US Paracentesis  02/22/2014   CLINICAL DATA:  Metastatic lung cancer with known malignant ascites. Current complaint of abdominal distention and pain. Request diagnostic and therapeutic paracentesis.  EXAM: ULTRASOUND GUIDED PARACENTESIS  COMPARISON:  Previous paracentesis 01/26/2014  PROCEDURE: An ultrasound guided paracentesis was thoroughly discussed with the patient and questions answered. The benefits, risks, alternatives and complications were also discussed. The patient understands and wishes to proceed with the procedure. Written consent was obtained.  Ultrasound was performed to localize and mark an adequate pocket of fluid in the right lower quadrant of the abdomen. The area was then prepped and draped in the normal sterile fashion. 1% Lidocaine was used for local anesthesia. Under ultrasound guidance a 19 gauge Yueh catheter was introduced. Paracentesis was performed. The catheter was removed and a dressing applied.   COMPLICATIONS: None immediate  FINDINGS: A total of approximately 4.1 L of dark yellow fluid was removed. A fluid sample was sent for laboratory analysis.  IMPRESSION: Successful ultrasound guided paracentesis yielding 4.1 L of ascites.  Read by:  Ascencion Dike, PA-C   Electronically Signed   By: Markus Daft M.D.   On: 02/22/2014 15:18   Dg Chest Port 1 View  02/26/2014   CLINICAL DATA:  Chest pain.  Short of breath.  EXAM: PORTABLE CHEST - 1 VIEW  COMPARISON:  01/18/2014.  FINDINGS: Moderate to large pleural effusion on the right is stable. Volume loss on the right is also stable. No lung consolidation or edema. No left pleural effusion. No pneumothorax.  Cardiac silhouette is normal in size. Upper lobe emphysema is stable.  IMPRESSION: No acute findings. Stable appearance from the prior chest radiograph. Moderate to large right pleural effusion. COPD.   Electronically Signed   By: Lajean Manes M.D.   On: 02/26/2014 08:49   Dg Abd Acute W/chest  02/22/2014   CLINICAL DATA:  Shortness of breath with upper abdominal pain. History of lung cancer.  EXAM: ACUTE ABDOMEN SERIES (ABDOMEN 2 VIEW & CHEST 1 VIEW)  COMPARISON:  Abdominal radiographs 01/30/2014 and 01/29/2014. Chest radiographs 01/18/2014 and CT 01/25/2014.  FINDINGS:  There is stable chronic volume loss in the right hemithorax with a chronic loculated pleural effusion and partial right lung collapse. Right apical thickening is stable. The left lung is clear. The heart size and mediastinal contours are stable.  Abdominal radiographs demonstrate moderate mid small bowel distension, slightly increased from prior studies. There is some contrast material within the colon IT which is relatively decompressed. Peripheral abdominal density suggests residual ascites. There is no evidence of pneumoperitoneum.  IMPRESSION: 1. Stable chronic findings in the right hemithorax. No acute cardiopulmonary process. 2. 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   Electronically Signed   By: Camie Patience M.D.   On: 02/22/2014 10:04   Dg Abd Portable 1v  01/30/2014   CLINICAL DATA:  Abdominal distention  EXAM: PORTABLE ABDOMEN - 1 VIEW  COMPARISON:  01/29/2014  FINDINGS: Scattered large and small bowel gas is identified. Some mildly loops of small bowel remain. Correlation with the clinical exam is again recommended. No bony abnormality is seen. No free air is noted.  IMPRESSION: Stable appearance of dilated small bowel loops.   Electronically Signed   By: Inez Catalina M.D.   On: 01/30/2014 10:32  Results for orders placed during the hospital encounter of 02/22/14  CBC WITH DIFFERENTIAL      Result Value Ref Range   WBC 9.0  4.0 - 10.5 K/uL   RBC 5.03  4.22 - 5.81 MIL/uL   Hemoglobin 12.3 (*) 13.0 - 17.0 g/dL   HCT 40.6  39.0 - 52.0 %   MCV 80.7  78.0 - 100.0 fL   MCH 24.5 (*) 26.0 - 34.0 pg   MCHC 30.3  30.0 - 36.0 g/dL   RDW 17.9 (*) 11.5 - 15.5 %   Platelets 203  150 - 400 K/uL   Neutrophils Relative % 84 (*) 43 - 77 %   Neutro Abs 7.6  1.7 - 7.7 K/uL   Lymphocytes Relative 10 (*) 12 - 46 %   Lymphs Abs 0.9  0.7 - 4.0 K/uL   Monocytes Relative 6  3 - 12 %   Monocytes Absolute 0.6  0.1 - 1.0 K/uL   Eosinophils Relative 0  0 - 5 %   Eosinophils Absolute 0.0  0.0 - 0.7 K/uL   Basophils Relative 0  0 - 1 %   Basophils Absolute 0.0  0.0 - 0.1 K/uL  COMPREHENSIVE METABOLIC PANEL      Result Value Ref Range   Sodium 139  137 - 147 mEq/L   Potassium 4.4  3.7 - 5.3 mEq/L   Chloride 97  96 - 112 mEq/L   CO2 25  19 - 32 mEq/L   Glucose, Bld 147 (*) 70 - 99 mg/dL   BUN 27 (*) 6 - 23 mg/dL   Creatinine, Ser 0.78  0.50 - 1.35 mg/dL   Calcium 9.3  8.4 - 10.5 mg/dL   Total Protein 7.8  6.0 - 8.3 g/dL   Albumin 2.6 (*) 3.5 - 5.2 g/dL   AST 17  0 - 37 U/L   ALT 12  0 - 53 U/L   Alkaline Phosphatase 125 (*) 39 - 117 U/L   Total Bilirubin 0.4  0.3 - 1.2 mg/dL   GFR  calc non Af Amer >90  >90 mL/min   GFR calc Af Amer >90  >90 mL/min   Anion gap 17 (*) 5 - 15  LIPASE, BLOOD  Result Value Ref Range   Lipase 23  11 - 59 U/L  URINALYSIS, ROUTINE W REFLEX MICROSCOPIC      Result Value Ref Range   Color, Urine AMBER (*) YELLOW   APPearance CLEAR  CLEAR   Specific Gravity, Urine 1.038 (*) 1.005 - 1.030   pH 5.0  5.0 - 8.0   Glucose, UA NEGATIVE  NEGATIVE mg/dL   Hgb urine dipstick NEGATIVE  NEGATIVE   Bilirubin Urine MODERATE (*) NEGATIVE   Ketones, ur 15 (*) NEGATIVE mg/dL   Protein, ur NEGATIVE  NEGATIVE mg/dL   Urobilinogen, UA 2.0 (*) 0.0 - 1.0 mg/dL   Nitrite NEGATIVE  NEGATIVE   Leukocytes, UA SMALL (*) NEGATIVE  BODY FLUID CELL COUNT WITH DIFFERENTIAL      Result Value Ref Range   Fluid Type-FCT PERITONEAL     Color, Fluid AMBER (*) YELLOW   Appearance, Fluid HAZY (*) CLEAR   WBC, Fluid 658  0 - 1000 cu mm   Neutrophil Count, Fluid 4  0 - 25 %   Lymphs, Fluid 49     Monocyte-Macrophage-Serous Fluid 47 (*) 50 - 90 %  PROTEIN, BODY FLUID      Result Value Ref Range   Total protein, fluid 5.9     Fluid Type-FTP Peritoneal    URINE MICROSCOPIC-ADD ON      Result Value Ref Range   WBC, UA 7-10  <3 WBC/hpf   Bacteria, UA RARE  RARE  BASIC METABOLIC PANEL      Result Value Ref Range   Sodium 140  137 - 147 mEq/L   Potassium 4.5  3.7 - 5.3 mEq/L   Chloride 103  96 - 112 mEq/L   CO2 27  19 - 32 mEq/L   Glucose, Bld 105 (*) 70 - 99 mg/dL   BUN 16  6 - 23 mg/dL   Creatinine, Ser 0.64  0.50 - 1.35 mg/dL   Calcium 8.7  8.4 - 10.5 mg/dL   GFR calc non Af Amer >90  >90 mL/min   GFR calc Af Amer >90  >90 mL/min   Anion gap 10  5 - 15  PATHOLOGIST SMEAR REVIEW      Result Value Ref Range   Path Review Reviewed By Violet Baldy, M.D.    GLUCOSE, CAPILLARY      Result Value Ref Range   Glucose-Capillary 97  70 - 99 mg/dL   Comment 1 Documented in Chart     Comment 2 Notify RN    CBC      Result Value Ref Range   WBC 7.3  4.0 - 10.5  K/uL   RBC 4.89  4.22 - 5.81 MIL/uL   Hemoglobin 11.8 (*) 13.0 - 17.0 g/dL   HCT 40.7  39.0 - 52.0 %   MCV 83.2  78.0 - 100.0 fL   MCH 24.1 (*) 26.0 - 34.0 pg   MCHC 29.0 (*) 30.0 - 36.0 g/dL   RDW 18.1 (*) 11.5 - 15.5 %   Platelets 203  150 - 400 K/uL  BASIC METABOLIC PANEL      Result Value Ref Range   Sodium 140  137 - 147 mEq/L   Potassium 4.4  3.7 - 5.3 mEq/L   Chloride 102  96 - 112 mEq/L   CO2 25  19 - 32 mEq/L   Glucose, Bld 113 (*) 70 - 99 mg/dL   BUN 18  6 - 23 mg/dL   Creatinine, Ser 0.70  0.50 - 1.35 mg/dL   Calcium 9.0  8.4 - 10.5 mg/dL   GFR calc non Af Amer >90  >90 mL/min   GFR calc Af Amer >90  >90 mL/min   Anion gap 13  5 - 15  I-STAT TROPOININ, ED      Result Value Ref Range   Troponin i, poc 0.00  0.00 - 0.08 ng/mL   Comment 3            A/P: Pt with hx of advanced adenocarcinoma of lung with recurrent symptomatic malignant ascites. Tent plan is for placement of a tunneled peritoneal catheter on 7/22 as long as sufficient ascites is present on preliminary US imaging. Details/risks of procedure d/w pt/Marsha Davina Poke (caretaker) with their understanding and consent.

## 2014-02-28 NOTE — Progress Notes (Signed)
IP PROGRESS NOTE  Subjective:   Events noted patient continued to decline clinically. He is for the most part bedridden with poor by mouth intake and limited mobility. His pain seems to be under reasonable control per  Objective:  Vital signs in last 24 hours: Temp:  [98.1 F (36.7 C)-98.2 F (36.8 C)] 98.2 F (36.8 C) (07/21 0524) Pulse Rate:  [103-119] 117 (07/21 0524) Resp:  [16-18] 18 (07/21 0524) BP: (107-126)/(79-87) 111/81 mmHg (07/21 0524) SpO2:  [95 %-100 %] 98 % (07/21 1033) Weight:  [189 lb 6 oz (85.9 kg)] 189 lb 6 oz (85.9 kg) (07/21 0524) Weight change: 2 lb 6.8 oz (1.1 kg) Last BM Date: 02/20/14  Intake/Output from previous day: 07/20 0701 - 07/21 0700 In: 1020 [P.O.:120; I.V.:800; IV Piggyback:100] Out: -   Mouth: mucous membranes moist, pharynx normal without lesions Resp: clear to auscultation bilaterally and Decreased breast sounds at the bases. Cardio: regular rate and rhythm, S1, S2 normal, no murmur, click, rub or gallop GI: soft, non-tender; bowel sounds normal; no masses,  no organomegaly and Distended with positive bowel sounds. Tender to touch. Shifting dullness was noted. Extremities: edema Bilaterally    Lab Results:  Recent Labs  02/28/14 0003  WBC 7.3  HGB 11.8*  HCT 40.7  PLT 203    BMET  Recent Labs  02/28/14 0003  NA 140  K 4.4  CL 102  CO2 25  GLUCOSE 113*  BUN 18  CREATININE 0.70  CALCIUM 9.0    Studies/Results: No results found.  Medications: I have reviewed the patient's current medications.  Assessment/Plan:  53 year old gentleman with the following issues :  1. Abdominal pain associated with peritoneal implants and ascites. Fluid cytology is positive for malignant cells. I agree with a tunnel catheter for frequent fluid draining.  2. Adenocarcinoma presenting with pleural effusion: No evidence of any recurrent disease in the chest at this time.  3. Prognosis: I continued to have to address this with him.  His prognosis is very poor and has very limited life expectancy. Is not a candidate for any aggressive therapy and I recommend comfort care only. I will ll continue to address that with him and his family.    LOS: 6 days   [facility] 02/28/2014, 12:15 PM

## 2014-02-28 NOTE — Progress Notes (Signed)
Progress Note from the Palliative Medicine Team at [facility]: Geoffrey Richardson is very lethargic today. He is arousable to voice and oriented when he arouses. However, I am very concerned with his lethargy given his history of COPD. He has also voiced desire for full code and full aggressive treatment which limits our ability to manage his pain. He has also been refusing physical therapy and wanting to just sleep away his pain. I will hold his Oxycontin overnight and decrease his dilaudid overnight and will reassess in the morning his mental status and pain control. I will continue to follow and support Geoffrey Richardson.    Objective: Allergies  Allergen Reactions  . Morphine And Related Nausea And Vomiting and Other (See Comments)    dizziness   Scheduled Meds: . budesonide-formoterol  1 puff Inhalation Daily  . [START ON 03/01/2014]  ceFAZolin (ANCEF) IV  2 g Intravenous On Call  . enoxaparin (LOVENOX) injection  40 mg Subcutaneous Q24H  . fluconazole (DIFLUCAN) IV  200 mg Intravenous Daily  . hydrALAZINE  25 mg Oral 3 times per day  . labetalol  100 mg Oral BID  . lactose free nutrition  237 mL Oral TID  . lidocaine  2 patch Transdermal q morning - 10a  . magic mouthwash  10 mL Oral QID  . metoCLOPramide  10 mg Oral TID AC & HS  . multivitamin with minerals  1 tablet Oral Daily  . OxyCODONE  45 mg Oral 3 times per day  . pantoprazole  40 mg Oral Daily  . senna-docusate  1 tablet Oral Daily   Continuous Infusions: . sodium chloride 50 mL/hr at 02/28/14 0034   PRN Meds:.acetaminophen, acetaminophen, albuterol, ALPRAZolam, alum & mag hydroxide-simeth, bisacodyl, chlorproMAZINE, HYDROmorphone (DILAUDID) injection, ondansetron (ZOFRAN) IV, ondansetron, oxyCODONE, polyethylene glycol  BP 115/79  Pulse 111  Temp(Src) 98.1 F (36.7 C) (Oral)  Resp 18  Ht 6' (1.829 m)  Wt 85.9 kg (189 lb 6 oz)  BMI 25.68 kg/m2  SpO2 100%   PPS: 30%  Pain Score: improved   Intake/Output  Summary (Last 24 hours) at 02/28/14 1629 Last data filed at 02/28/14 1528  Gross per 24 hour  Intake    180 ml  Output    550 ml  Net   -370 ml      LBM: 02/20/14      Physical Exam:  General: NAD, cachectic, ill appearing  HEENT: Severe temporal muscle wasting, no JVD, oral mucosa moist + thrush  Chest: CTA throughout, diminished bases, no labored breathing  CVS: Tachycardic  Abdomen: Distended, tender, +BS  Ext: MAE, 2+ BLE edema, warm to touch  Neuro: Awake, alert, oriented x 3   Labs: CBC    Component Value Date/Time   WBC 7.3 02/28/2014 0003   WBC 6.1 12/13/2013 0803   RBC 4.89 02/28/2014 0003   RBC 4.53 12/13/2013 0803   HGB 11.8* 02/28/2014 0003   HGB 11.3* 12/13/2013 0803   HCT 40.7 02/28/2014 0003   HCT 37.0* 12/13/2013 0803   PLT 203 02/28/2014 0003   PLT 267 12/13/2013 0803   MCV 83.2 02/28/2014 0003   MCV 81.8 12/13/2013 0803   MCH 24.1* 02/28/2014 0003   MCH 24.9* 12/13/2013 0803   MCHC 29.0* 02/28/2014 0003   MCHC 30.5* 12/13/2013 0803   RDW 18.1* 02/28/2014 0003   RDW 18.5* 12/13/2013 0803   LYMPHSABS 0.9 02/22/2014 0922   LYMPHSABS 1.5 12/13/2013 0803   MONOABS 0.6 02/22/2014 8101  MONOABS 0.4 12/13/2013 0803   EOSABS 0.0 02/22/2014 0922   EOSABS 0.0 12/13/2013 0803   BASOSABS 0.0 02/22/2014 0922   BASOSABS 0.0 12/13/2013 0803    BMET    Component Value Date/Time   NA 140 02/28/2014 0003   NA 144 12/13/2013 0803   K 4.4 02/28/2014 0003   K 4.1 12/13/2013 0803   CL 102 02/28/2014 0003   CL 103 01/19/2013 1008   CO2 25 02/28/2014 0003   CO2 26 12/13/2013 0803   GLUCOSE 113* 02/28/2014 0003   GLUCOSE 109 12/13/2013 0803   GLUCOSE 129* 01/19/2013 1008   BUN 18 02/28/2014 0003   BUN 16.1 12/13/2013 0803   CREATININE 0.70 02/28/2014 0003   CREATININE 0.8 12/13/2013 0803   CALCIUM 9.0 02/28/2014 0003   CALCIUM 9.5 12/13/2013 0803   GFRNONAA >90 02/28/2014 0003   GFRAA >90 02/28/2014 0003    CMP     Component Value Date/Time   NA 140 02/28/2014 0003   NA 144 12/13/2013 0803   K 4.4 02/28/2014 0003    K 4.1 12/13/2013 0803   CL 102 02/28/2014 0003   CL 103 01/19/2013 1008   CO2 25 02/28/2014 0003   CO2 26 12/13/2013 0803   GLUCOSE 113* 02/28/2014 0003   GLUCOSE 109 12/13/2013 0803   GLUCOSE 129* 01/19/2013 1008   BUN 18 02/28/2014 0003   BUN 16.1 12/13/2013 0803   CREATININE 0.70 02/28/2014 0003   CREATININE 0.8 12/13/2013 0803   CALCIUM 9.0 02/28/2014 0003   CALCIUM 9.5 12/13/2013 0803   PROT 7.8 02/22/2014 0922   PROT 8.3 12/13/2013 0803   ALBUMIN 2.6* 02/22/2014 0922   ALBUMIN 3.1* 12/13/2013 0803   AST 17 02/22/2014 0922   AST 10 12/13/2013 0803   ALT 12 02/22/2014 0922   ALT <6 12/13/2013 0803   ALKPHOS 125* 02/22/2014 0922   ALKPHOS 94 12/13/2013 0803   BILITOT 0.4 02/22/2014 0922   BILITOT 0.21 12/13/2013 0803   GFRNONAA >90 02/28/2014 0003   GFRAA >90 02/28/2014 0003     Assessment and Plan:  1. Code Status: FULL 2. Symptom Control:  1. Anxiety: Alprazolam 0.25 mg TID prn.  2. Nausea: Ondansetron prn.  3. Bowel Regimen: Senokot-S daily. Miralax prn. Sorbitol x 1 and dulcolax supp x1 if no results from sorbitol. Dulcolax supp daily prn.  4. Pain:  1. Lidoderm patches under scapulas for back pain.  2. Oxycontin on hold tonight and reassess in morning for sedation.  3. Oxycodone 10 mg every 4 hours prn. HOLD for sedation.  4. Dilaudid 0.5 mg every 3 hours prn and nursing to utilize ONLY after oxycodone ineffective. HOLD FOR SEDATION.  3. Psycho/Social: Emotional support provided to patient and family/friends.  4. Spiritual: Chaplain following.  5. Disposition: To be determined on outcomes.     Time In Time Out Total Time Spent with Patient Total Overall Time  0800 0830 35min 27min    Greater than 50%  of this time was spent counseling and coordinating care related to the above assessment and plan.  Vinie Sill, NP Palliative Medicine Team Pager # [phone] (M-F 8a-5p) Team Phone # [phone] (Nights/Weekends)

## 2014-03-01 ENCOUNTER — Inpatient Hospital Stay (HOSPITAL_COMMUNITY): Payer: Medicaid Other

## 2014-03-01 DIAGNOSIS — R1013 Epigastric pain: Secondary | ICD-10-CM

## 2014-03-01 LAB — CBC
HCT: 39.7 % (ref 39.0–52.0)
Hemoglobin: 11.8 g/dL — ABNORMAL LOW (ref 13.0–17.0)
MCH: 25.1 pg — AB (ref 26.0–34.0)
MCHC: 29.7 g/dL — ABNORMAL LOW (ref 30.0–36.0)
MCV: 84.5 fL (ref 78.0–100.0)
PLATELETS: 197 10*3/uL (ref 150–400)
RBC: 4.7 MIL/uL (ref 4.22–5.81)
RDW: 18.3 % — ABNORMAL HIGH (ref 11.5–15.5)
WBC: 6.9 10*3/uL (ref 4.0–10.5)

## 2014-03-01 LAB — CREATININE, SERUM
CREATININE: 1.08 mg/dL (ref 0.50–1.35)
GFR calc Af Amer: 89 mL/min — ABNORMAL LOW (ref >90)
GFR, EST NON AFRICAN AMERICAN: 77 mL/min — AB (ref >90)

## 2014-03-01 LAB — PROTIME-INR
INR: 1.21 (ref 0.00–1.49)
Prothrombin Time: 15.3 seconds — ABNORMAL HIGH (ref 11.6–15.2)

## 2014-03-01 MED ORDER — MIDAZOLAM HCL 2 MG/2ML IJ SOLN
INTRAMUSCULAR | Status: AC | PRN
  Administered 2014-03-01: 0.5 mg via INTRAVENOUS

## 2014-03-01 MED ORDER — FENTANYL CITRATE 0.05 MG/ML IJ SOLN
INTRAMUSCULAR | Status: AC | PRN
  Administered 2014-03-01: 50 ug via INTRAVENOUS

## 2014-03-01 MED ORDER — ENOXAPARIN SODIUM 40 MG/0.4ML ~~LOC~~ SOLN
40.0000 mg | Freq: Every day | SUBCUTANEOUS | Status: DC
  Administered 2014-03-03 – 2014-03-07 (×6): 40 mg via SUBCUTANEOUS
  Filled 2014-03-01 (×8): qty 0.4

## 2014-03-01 MED ORDER — FENTANYL CITRATE 0.05 MG/ML IJ SOLN
INTRAMUSCULAR | Status: AC
  Filled 2014-03-01: qty 2

## 2014-03-01 MED ORDER — MIDAZOLAM HCL 2 MG/2ML IJ SOLN
INTRAMUSCULAR | Status: AC
  Filled 2014-03-01: qty 2

## 2014-03-01 NOTE — Progress Notes (Signed)
Progress Note from the Palliative Medicine Team at [place] [facility]: I spoke with Geoffrey Richardson this morning and he is much more alert than yesterday but continues to be very sleepy. He tells me his pain is better today and epigastric pain is not present but his back pain is intermittent. He is planned for IR for tunneled catheter for ascites and comfort. He feels less anxious and says he slept well last night. Has still not had bowel movement.   I spoke with friend and HCPOA, Geoffrey Richardson, at her request outside the room about his prognosis. I told her that he could have as little as weeks left and that they should prepare themselves for his passing. Geoffrey Richardson is tearful but understanding and knows that there is nothing we can do to reverse his cancer and poor condition. We discussed the difficulty he is having and the fear he is experiencing about coming to the end of his life. He continues to proclaim "I don't want to die." His family is respecting where he is but are also trying to help support him the best they are able. Geoffrey Richardson understands that putting him through CPR and ACLS will not reverse his cancer and what is causing his inevitable death but knows that he wants this. I do believe Geoffrey Richardson knows that his time is short but he is not accepting of this truth and is fighting for more time. Geoffrey Richardson relies on his faith so I will ask the chaplains to continue visiting to try and help give him some peace among all his suffering. Geoffrey Richardson says that as his HCPOA she will not let him continue to suffer when she has to make that choice. She also says that she has discussed with her family and they are prepared and accepting if he passes at her home. I will continue to follow and support Geoffrey Richardson and his family the best I am able.     Objective: Allergies  Allergen Reactions  . Morphine And Related Nausea And Vomiting and Other (See Comments)    dizziness   Scheduled Meds: . bisacodyl  10 mg Rectal  Once  . budesonide-formoterol  1 puff Inhalation Daily  .  ceFAZolin (ANCEF) IV  2 g Intravenous On Call  . fluconazole (DIFLUCAN) IV  200 mg Intravenous Daily  . hydrALAZINE  25 mg Oral 3 times per day  . labetalol  100 mg Oral BID  . lactose free nutrition  237 mL Oral TID  . lidocaine  2 patch Transdermal q morning - 10a  . magic mouthwash  10 mL Oral QID  . metoCLOPramide  10 mg Oral TID AC & HS  . multivitamin with minerals  1 tablet Oral Daily  . pantoprazole  40 mg Oral Daily  . senna-docusate  1 tablet Oral Daily   Continuous Infusions: . sodium chloride 50 mL/hr at 02/28/14 1637   PRN Meds:.acetaminophen, acetaminophen, albuterol, ALPRAZolam, alum & mag hydroxide-simeth, bisacodyl, chlorproMAZINE, HYDROmorphone (DILAUDID) injection, ondansetron (ZOFRAN) IV, ondansetron, oxyCODONE, polyethylene glycol  BP 114/86  Pulse 109  Temp(Src) 97.8 F (36.6 C) (Oral)  Resp 18  Ht 6' (1.829 m)  Wt 85.5 kg (188 lb 7.9 oz)  BMI 25.56 kg/m2  SpO2 100%   PPS: 20%     Intake/Output Summary (Last 24 hours) at 03/01/14 0943 Last data filed at 03/01/14 0500  Gross per 24 hour  Intake    670 ml  Output    600 ml  Net  70 ml      LBM: 20%    Stool Softner: yes  Physical Exam:  General: NAD, cachectic, ill appearing  HEENT: Severe temporal muscle wasting, no JVD, oral mucosa moist + thrush improved from yesterday Chest: No labored breathing, symmetric CVS: Tachycardic  Abdomen: Distended, tender to palpation, +BS  Ext: MAE, 2+ BLE edema, warm to touch  Neuro: Awake, alert, oriented x 3    Labs: CBC    Component Value Date/Time   WBC 6.9 03/01/2014 0450   WBC 6.1 12/13/2013 0803   RBC 4.70 03/01/2014 0450   RBC 4.53 12/13/2013 0803   HGB 11.8* 03/01/2014 0450   HGB 11.3* 12/13/2013 0803   HCT 39.7 03/01/2014 0450   HCT 37.0* 12/13/2013 0803   PLT 197 03/01/2014 0450   PLT 267 12/13/2013 0803   MCV 84.5 03/01/2014 0450   MCV 81.8 12/13/2013 0803   MCH 25.1* 03/01/2014 0450   MCH  24.9* 12/13/2013 0803   MCHC 29.7* 03/01/2014 0450   MCHC 30.5* 12/13/2013 0803   RDW 18.3* 03/01/2014 0450   RDW 18.5* 12/13/2013 0803   LYMPHSABS 0.9 02/22/2014 0922   LYMPHSABS 1.5 12/13/2013 0803   MONOABS 0.6 02/22/2014 0922   MONOABS 0.4 12/13/2013 0803   EOSABS 0.0 02/22/2014 0922   EOSABS 0.0 12/13/2013 0803   BASOSABS 0.0 02/22/2014 0922   BASOSABS 0.0 12/13/2013 0803    BMET    Component Value Date/Time   NA 140 02/28/2014 0003   NA 144 12/13/2013 0803   K 4.4 02/28/2014 0003   K 4.1 12/13/2013 0803   CL 102 02/28/2014 0003   CL 103 01/19/2013 1008   CO2 25 02/28/2014 0003   CO2 26 12/13/2013 0803   GLUCOSE 113* 02/28/2014 0003   GLUCOSE 109 12/13/2013 0803   GLUCOSE 129* 01/19/2013 1008   BUN 18 02/28/2014 0003   BUN 16.1 12/13/2013 0803   CREATININE 1.08 03/01/2014 0450   CREATININE 0.8 12/13/2013 0803   CALCIUM 9.0 02/28/2014 0003   CALCIUM 9.5 12/13/2013 0803   GFRNONAA 77* 03/01/2014 0450   GFRAA 89* 03/01/2014 0450    CMP     Component Value Date/Time   NA 140 02/28/2014 0003   NA 144 12/13/2013 0803   K 4.4 02/28/2014 0003   K 4.1 12/13/2013 0803   CL 102 02/28/2014 0003   CL 103 01/19/2013 1008   CO2 25 02/28/2014 0003   CO2 26 12/13/2013 0803   GLUCOSE 113* 02/28/2014 0003   GLUCOSE 109 12/13/2013 0803   GLUCOSE 129* 01/19/2013 1008   BUN 18 02/28/2014 0003   BUN 16.1 12/13/2013 0803   CREATININE 1.08 03/01/2014 0450   CREATININE 0.8 12/13/2013 0803   CALCIUM 9.0 02/28/2014 0003   CALCIUM 9.5 12/13/2013 0803   PROT 7.8 02/22/2014 0922   PROT 8.3 12/13/2013 0803   ALBUMIN 2.6* 02/22/2014 0922   ALBUMIN 3.1* 12/13/2013 0803   AST 17 02/22/2014 0922   AST 10 12/13/2013 0803   ALT 12 02/22/2014 0922   ALT <6 12/13/2013 0803   ALKPHOS 125* 02/22/2014 0922   ALKPHOS 94 12/13/2013 0803   BILITOT 0.4 02/22/2014 0922   BILITOT 0.21 12/13/2013 0803   GFRNONAA 77* 03/01/2014 0450   GFRAA 89* 03/01/2014 0450     Assessment and Plan:  1. Code Status: FULL 2. Symptom Control:  1. Anxiety: Alprazolam 0.5 mg TID prn.   2. Nausea: Ondansetron prn.  3. Bowel Regimen: Senokot-S daily. Miralax prn. Sorbitol x 1 yesterday.  Dulcolax supp daily prn - will give dose today if he allows.  4. Pain:  1. Lidoderm patches under scapulas for back pain.  2. Continue to hold Oxycontin. He verbalizes good pain control today and is still slightly sedate.  3. Oxycodone 10 mg every 4 hours prn. HOLD for sedation.  4. Dilaudid 0.5 mg every 3 hours prn and nursing to utilize ONLY after oxycodone ineffective. HOLD FOR SEDATION.  3. Psycho/Social: Emotional support provided to patient and family/friends.  4. Spiritual: Chaplain following.  5. Disposition: To be determined on outcomes.    Patient Documents Completed or Given: Document Given Completed  Advanced Directives Pkt  yes  MOST    DNR    Gone from My Sight    Hard Choices      Time In Time Out Total Time Spent with Patient Total Overall Time  0830 0915 53min 90min    Greater than 50%  of this time was spent counseling and coordinating care related to the above assessment and plan.  Vinie Sill, NP Palliative Medicine Team Pager # [phone] (M-F 8a-5p) Team Phone # [phone] (Nights/Weekends)

## 2014-03-01 NOTE — Procedures (Signed)
Procedure:  Tunneled peritoneal catheter placement Findings:  Tunneled PleurX catheter placed via RLQ approach into peritoneal cavity. 2.1 L ascites removed immediately after placement.

## 2014-03-01 NOTE — Progress Notes (Signed)
Patient ID: Geoffrey Richardson, male   DOB: [birth year], 53 y.o.   MRN: [id number] [facility] PROGRESS NOTE  Tagen Milby UYQ:[id number] DOB: 05-20-1961 DOA: 02/22/2014 PCP: Philis Fendt, MD  Brief narrative: 53 y.o. male a PMH of non-small cell lung cancer with recurrent malignant pleural effusion status post pleural catheter insertion 01/15/2013 (subsequently removed after became infected) and chemotherapy with carboplatin and Taxotere on 02/17/13 status post 6 cycles which completed on 06/02/13, recent hospital admission 01/25/14-02/03/14 for evaluation of abdominal pain and early satiety. CT scan of the chest, abdomen and pelvis done during that admission showed ascites. He had a 1.4 L paracentesis done 01/26/14. Plan was to followup with oncology as an outpatient for consideration of further chemotherapy if his performance status/nutrition improved, however he has failed to thrive and was readmitted 02/22/14 with uncontrolled cancer related pain from malignant ascites and shortness of breath from a malignant pleural effusion. Palliative care has been consulted, however the patient continues to want aggressive care despite his deteriorating status and poor prognosis.   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 Also lidocaine patch Plan on placement of a tunneled peritoneal catheter while in hospital per IR Appreciate PT eval  Active Problems:  Oral Thrush  Placed on Diflucan and Magic mouthwash  Benign essential hypertension  Continue hydralazine and labetalol. COPD (chronic obstructive pulmonary disease)/chronic respiratory failure with  hypoxia / dyspnea  Continue Symbicort. Continue bronchodilators as needed.  Provide supplemental oxygen to keep saturations greater than 92%.  Severe protein calorie malnutrition  Pt meets criteria for severe MALNUTRITION in the context of chronic illness as evidenced by PO intake < 75% for > one month, 6.8% body weight loss in one month.  Secondary to cancer related cachexia. Boost nutritional supplements ordered. Malignant pleural effusion  Effusion is loculated and cannot be drained per interventional radiology. 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 [name] 02/22/14. Poor prognosis with no recommendations for chemotherapy.  PCT involved and assistance is appreciated  DVT prophylaxis  Lovenox ordered.  Code Status: Full.  Family Communication: [name] (close friend): [phone] updated at bedside 02/24/14. No family at bedside today.  Disposition Plan: Home when stable.   Leisa Lenz, MD  [facility] Pager [phone]  If 7PM-7AM, please contact night-coverage [email or web address] Password [email or web address] 03/01/2014, 9:20 AM   LOS: 7 days   IV access:  Peripheral IV  Consultants: Dr. Zola Button, Oncology.  Dr. Billey Chang, [place]   Procedures: Acute abdominal series 02/22/14: Stable chronic findings in the right hemithorax. No acute cardiopulmonary process. Mild progression of small bowel distention with ascites and peritoneal nodularity on CT concerning for underlying peritoneal carcinomatosis.  Ultrasound guided paracentesis 02/22/14: Successful ultrasound-guided paracentesis yielding 4.1 L of ascites.  Ultrasound of the chest 02/22/14: Chronic multiloculated pleural effusions. Patient had prior Pleurx catheter to the right side. This was removed due to loculation of the effusion and no output from the catheter. The  patient may have a trapped right lung and thoracentesis could result in a pneumothorax and  therefore was not done. US guided paracentesis 03/01/2014 with 2.1 L fluid removed  Other consultants:  Physical therapy  Dietician  Antibiotics:  None   HPI/Subjective: No acute overnight events.  Objective: Filed Vitals:   02/28/14 1420 02/28/14 2026 03/01/14 0454 03/01/14 0904  BP: 115/79 124/86 114/86   Pulse: 111 117 109   Temp: 98.1 F (36.7 C) 98.2 F (36.8 C) 97.8 F (36.6 C)   TempSrc: Oral Oral Oral   Resp: 18 18 18    Height:      Weight:   85.5 kg (188 lb 7.9 oz)   SpO2: 100% 100% 97% 100%    Intake/Output Summary (Last 24 hours) at 03/01/14 0920 Last data filed at 03/01/14 0500  Gross per 24 hour  Intake    670 ml  Output    600 ml  Net     70 ml    Exam:   General:  Pt is alert, follows commands appropriately, not in acute distress  Cardiovascular: Regular rate and rhythm, S1/S2, no murmurs  Respiratory: Clear to auscultation bilaterally, no wheezing, no crackles, no rhonchi  Abdomen: distended, tender, bowel sounds present  Extremities: + 2 LE pitting edema, pulses DP and PT palpable bilaterally  Neuro: Grossly nonfocal  Data Reviewed: Basic Metabolic Panel:  Recent Labs Lab 02/22/14 0922 02/23/14 0427 02/28/14 0003 03/01/14 0450  NA 139 140 140  --   K 4.4 4.5 4.4  --   CL 97 103 102  --   CO2 25 27 25   --   GLUCOSE 147* 105* 113*  --   BUN 27* 16 18  --   CREATININE 0.78 0.64 0.70 1.08  CALCIUM 9.3 8.7 9.0  --    Liver Function Tests:  Recent Labs Lab 02/22/14 0922  AST 17  ALT 12  ALKPHOS 125*  BILITOT 0.4  PROT 7.8  ALBUMIN 2.6*    Recent Labs Lab 02/22/14 0922  LIPASE 23   No results found for this basename: AMMONIA,  in the last 168 hours CBC:  Recent Labs Lab 02/22/14 0922 02/28/14 0003 03/01/14 0450  WBC 9.0 7.3 6.9  NEUTROABS 7.6  --   --   HGB 12.3* 11.8* 11.8*  HCT 40.6 40.7 39.7  MCV 80.7 83.2 84.5   PLT 203 203 197   Cardiac Enzymes: No results found for this basename: CKTOTAL, CKMB, CKMBINDEX, TROPONINI,  in the last 168 hours BNP: No components found with this basename: POCBNP,  CBG:  Recent Labs Lab 02/27/14 0727  GLUCAP 97    No results found for this or any previous visit (from the past 240 hour(s)).   Studies: No results found.  Scheduled Meds: . bisacodyl  10 mg Rectal Once  . budesonide-formoterol  1 puff Inhalation Daily  .  ceFAZolin (ANCEF) IV  2 g Intravenous On Call  . fluconazole (DIFLUCAN) IV  200 mg Intravenous Daily  . hydrALAZINE  25 mg Oral 3 times per day  . labetalol  100 mg Oral BID  . lactose free nutrition  237 mL Oral TID  . lidocaine  2 patch Transdermal q morning - 10a  . magic mouthwash  10 mL Oral QID  . metoCLOPramide  10 mg Oral TID AC & HS  . multivitamin with minerals  1 tablet Oral Daily  . pantoprazole  40 mg Oral Daily  . senna-docusate  1 tablet Oral Daily   Continuous Infusions: . sodium chloride 50 mL/hr at 02/28/14 1637

## 2014-03-02 MED ORDER — PROMETHAZINE HCL 25 MG/ML IJ SOLN
12.5000 mg | Freq: Four times a day (QID) | INTRAMUSCULAR | Status: DC | PRN
  Administered 2014-03-02 – 2014-03-03 (×2): 12.5 mg via INTRAVENOUS
  Filled 2014-03-02: qty 1

## 2014-03-02 MED ORDER — PANTOPRAZOLE SODIUM 40 MG IV SOLR
40.0000 mg | Freq: Every day | INTRAVENOUS | Status: DC
  Administered 2014-03-02 – 2014-03-04 (×3): 40 mg via INTRAVENOUS
  Filled 2014-03-02 (×3): qty 40

## 2014-03-02 MED ORDER — CHLORPROMAZINE HCL 25 MG PO TABS
25.0000 mg | ORAL_TABLET | Freq: Three times a day (TID) | ORAL | Status: DC | PRN
  Administered 2014-03-08: 25 mg via ORAL
  Filled 2014-03-02 (×2): qty 1

## 2014-03-02 MED ORDER — SODIUM CHLORIDE 0.9 % IV SOLN
12.5000 mg | Freq: Three times a day (TID) | INTRAVENOUS | Status: DC | PRN
  Filled 2014-03-02: qty 0.5

## 2014-03-02 MED ORDER — METOPROLOL TARTRATE 1 MG/ML IV SOLN
5.0000 mg | Freq: Four times a day (QID) | INTRAVENOUS | Status: DC | PRN
  Administered 2014-03-03 (×2): 5 mg via INTRAVENOUS
  Filled 2014-03-02 (×2): qty 5

## 2014-03-02 MED ORDER — CHLORPROMAZINE HCL 25 MG/ML IJ SOLN
25.0000 mg | Freq: Three times a day (TID) | INTRAMUSCULAR | Status: DC | PRN
  Administered 2014-03-02 – 2014-03-03 (×3): 25 mg via INTRAVENOUS
  Filled 2014-03-02 (×6): qty 1

## 2014-03-02 MED ORDER — GI COCKTAIL ~~LOC~~
30.0000 mL | Freq: Three times a day (TID) | ORAL | Status: DC | PRN
  Administered 2014-03-03 – 2014-03-06 (×2): 30 mL via ORAL
  Filled 2014-03-02 (×3): qty 30

## 2014-03-02 MED ORDER — CHLORPROMAZINE HCL 10 MG PO TABS
10.0000 mg | ORAL_TABLET | Freq: Three times a day (TID) | ORAL | Status: DC | PRN
  Filled 2014-03-02: qty 1

## 2014-03-02 MED ORDER — METOCLOPRAMIDE HCL 5 MG/ML IJ SOLN
10.0000 mg | Freq: Three times a day (TID) | INTRAMUSCULAR | Status: DC
  Administered 2014-03-02 – 2014-03-05 (×8): 10 mg via INTRAVENOUS
  Filled 2014-03-02 (×10): qty 2

## 2014-03-02 NOTE — Progress Notes (Signed)
Patient ID: Geoffrey Richardson, male   DOB: [birth year], 53 y.o.   MRN: [id number] [facility] PROGRESS NOTE  Dauntae Derusha WUJ:[id number] DOB: 19-Feb-1961 DOA: 02/22/2014 PCP: Philis Fendt, MD  Brief narrative: 53 y.o. male a PMH of non-small cell lung cancer with recurrent malignant pleural effusion status post pleural catheter insertion 01/15/2013 (subsequently removed after became infected) and chemotherapy with carboplatin and Taxotere on 02/17/13 status post 6 cycles which completed on 06/02/13, recent hospital admission 01/25/14-02/03/14 for evaluation of abdominal pain and early satiety. CT scan of the chest, abdomen and pelvis done during that admission showed ascites. He had a 1.4 L paracentesis done 01/26/14. Plan was to followup with oncology as an outpatient for consideration of further chemotherapy if his performance status/nutrition improved, however he has failed to thrive and was readmitted 02/22/14 with uncontrolled cancer related pain from malignant ascites and shortness of breath from a malignant pleural effusion. Palliative care has been consulted, however the patient continues to want aggressive care despite his deteriorating status and poor prognosis.   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 Pt underwent multiple paracenteses: 7/15 with 4.1 L fluid removed, 7/19 with 2.5 L and 7/22 with 2.1 L fluid removed. Has peritoneal catheter and will drain every other day. Current pain regimen:lidocaine patch, dilaudid 0.5 mg IV every 3 hours PRN, oxycodone 10 mg  PO Q 4 hours PRN moderate pain.   Active Problems:  Oral Thrush  Placed on Diflucan and Magic mouthwash  Benign essential hypertension  Continue hydralazine and labetalol. COPD (chronic obstructive pulmonary disease)/chronic respiratory failure with hypoxia  / dyspnea  Continue Symbicort. Continue bronchodilators as needed.  Provide supplemental oxygen to keep saturations greater than 92%.  Severe protein calorie malnutrition  Pt meets criteria for severe MALNUTRITION in the context of chronic illness as evidenced by PO intake < 75% for > one month, 6.8% body weight loss in one month.  Secondary to cancer related cachexia. Boost nutritional supplements ordered. Malignant pleural effusion  Effusion is loculated and cannot be drained per interventional radiology. Functional quadriplegia  Secondary to nature of the malignancy and progressive decline  Metastatic Lung cancer  Seen by Dr. [name] 02/22/14. Poor prognosis with no recommendations for chemotherapy.  PCT involved and assistance is appreciated  DVT prophylaxis  Lovenox sub Q while pt is in hospital    Code Status: Full.  Family Communication: [name] (close friend): [phone] updated at bedside 02/24/14. No family at bedside today.  Disposition Plan: Home when stable.   IV access:   Peripheral IV Consultants:  Dr. Zola Button, Oncology.  Dr. Billey Chang, [place] [facility]  Procedures:  Acute abdominal series 02/22/14: Stable chronic findings in the right hemithorax. No acute cardiopulmonary process. Mild progression of small bowel distention with ascites and peritoneal nodularity on CT concerning for underlying peritoneal carcinomatosis.  Ultrasound guided paracentesis 02/22/14: Successful ultrasound-guided paracentesis yielding 4.1 L of ascites.  Ultrasound of the chest 02/22/14: Chronic multiloculated pleural effusions. Patient had prior Pleurx catheter to the right side. This was removed due to loculation of the effusion and no output from the catheter. The patient may have a trapped right lung and thoracentesis could result in a pneumothorax and therefore was not done.  US guided paracentesis 03/01/2014 with 2.1 L fluid removed Other  consultants:   Physical therapy   Dietician  Antibiotics:   None    Leisa Lenz, MD  [facility] Pager [phone]  If 7PM-7AM, please contact night-coverage [email or web address] Password [facility] 03/02/2014, 6:57 AM   LOS: 8 days    HPI/Subjective: No acute overnight events.  Objective: Filed Vitals:   03/01/14 1244 03/01/14 1431 03/01/14 2128 03/02/14 0508  BP: 119/86 112/79 119/87 125/93  Pulse: 117 115 115 115  Temp: 97.9 F (36.6 C) 97.8 F (36.6 C) 98.1 F (36.7 C) 97.9 F (36.6 C)  TempSrc: Oral Oral Oral Oral  Resp: 16 16 16 15   Height:      Weight:    92.942 kg (204 lb 14.4 oz)  SpO2: 99% 100% 98% 98%    Intake/Output Summary (Last 24 hours) at 03/02/14 0657 Last data filed at 03/02/14 7425  Gross per 24 hour  Intake   1040 ml  Output    300 ml  Net    740 ml    Exam:   General:  Pt is alert, follows commands appropriately, not in acute distress  Cardiovascular: Regular rate and rhythm, S1/S2, no murmurs  Respiratory: Clear to auscultation bilaterally, no wheezing, no crackles, no rhonchi  Abdomen: distended, bowel sounds present; drain in place  Extremities: +2 LE pitting edema, pulses DP and PT palpable bilaterally  Neuro: Grossly nonfocal  Data Reviewed: Basic Metabolic Panel:  Recent Labs Lab 02/28/14 0003 03/01/14 0450  NA 140  --   K 4.4  --   CL 102  --   CO2 25  --   GLUCOSE 113*  --   BUN 18  --   CREATININE 0.70 1.08  CALCIUM 9.0  --    Liver Function Tests: No results found for this basename: AST, ALT, ALKPHOS, BILITOT, PROT, ALBUMIN,  in the last 168 hours No results found for this basename: LIPASE, AMYLASE,  in the last 168 hours No results found for this basename: AMMONIA,  in the last 168 hours CBC:  Recent Labs Lab 02/28/14 0003 03/01/14 0450  WBC 7.3 6.9  HGB 11.8* 11.8*  HCT 40.7 39.7  MCV 83.2 84.5  PLT 203 197   Cardiac Enzymes: No results found for this basename: CKTOTAL, CKMB, CKMBINDEX,  TROPONINI,  in the last 168 hours BNP: No components found with this basename: POCBNP,  CBG:  Recent Labs Lab 02/27/14 0727  GLUCAP 97    No results found for this or any previous visit (from the past 240 hour(s)).   Studies: Ir Perc Tun Perit Cath W/port 03/01/2014    IMPRESSION: Placement of tunneled peritoneal drainage catheter via right abdominal approach. 2.1 liters of ascites was removed today after catheter placement.      Scheduled Meds: . bisacodyl  10 mg Rectal Once  . budesonide-formoterol  1 puff Inhalation Daily  . enoxaparin (LOVENOX)  40 mg Subcutaneous QHS  . fluconazole (DIFLUCAN)  200 mg Intravenous Daily  . hydrALAZINE  25 mg Oral 3 times per day  . labetalol  100 mg Oral BID  . lactose free nutrition  237 mL Oral TID  . lidocaine  2 patch Transdermal q morning - 10a  . magic mouthwash  10 mL Oral QID  . metoCLOPramide  10 mg Oral TID AC & HS  . multivitamin   1 tablet Oral Daily  . pantoprazole   40 mg Intravenous Daily  . senna-docusate  1 tablet Oral Daily   Continuous Infusions: . sodium chloride 50 mL/hr at 03/01/14 1442

## 2014-03-02 NOTE — Progress Notes (Signed)
Mr. Geoffrey Richardson was awake when I arrived, but at times it was difficult for me to understand what he was saying. In telling me how he was feeling he said he just wants "them to be upfront with me". He said "if I'm going to die tell me I'm going to die - if I'm going to get better then encourage me".  Mr. Geoffrey Richardson said he can go home and get better, stay here or just lay around and die.  He also said during the course of our conversation that he is not ready to die. Mr. Geoffrey Richardson joy seemed to come from talking about his sister and their recent visit. He said he wants to go home so he can get better. He expressed concerned for taking pain meds and getting co-dependent on them. (A conversation about the importance/purpose of controlling his pain may be helpful.)  Mr. Geoffrey Richardson seemed to begin to tire so we concluded our visit with prayer. He was very thankful for visit. Pls call [phone] whenever additional spt is needed. Ernest Haber Chaplain

## 2014-03-02 NOTE — Progress Notes (Signed)
Attempted to visit; pt's caregiver was bedside as was nurse administering pain med. Told pt and caregiver I will return the visit after lunch. Ernest Haber Chaplain  03/02/14 0900  Clinical Encounter Type  Visited With Family;Patient not available

## 2014-03-02 NOTE — Progress Notes (Signed)
Subjective: Pt without new c/o; still has abd discomfort  Objective: Vital signs in last 24 hours: Temp:  [97.8 F (36.6 C)-98.1 F (36.7 C)] 97.9 F (36.6 C) (07/23 0508) Pulse Rate:  [108-115] 115 (07/23 0508) Resp:  [15-16] 15 (07/23 0508) BP: (112-125)/(79-93) 125/93 mmHg (07/23 0508) SpO2:  [98 %-100 %] 100 % (07/23 0853) Weight:  [204 lb 14.4 oz (92.942 kg)] 204 lb 14.4 oz (92.942 kg) (07/23 0508) Last BM Date: 02/20/14  Intake/Output from previous day: 07/22 0701 - 07/23 0700 In: 1576.7 [P.O.:340; I.V.:1236.7] Out: 300 [Urine:300] Intake/Output this shift: Total I/O In: 929 [P.O.:929] Out: -   RLQ perit drain intact/capped, dressing dry; abd dist, diffusely tender  Lab Results:   Recent Labs  02/28/14 0003 03/01/14 0450  WBC 7.3 6.9  HGB 11.8* 11.8*  HCT 40.7 39.7  PLT 203 197   BMET  Recent Labs  02/28/14 0003 03/01/14 0450  NA 140  --   K 4.4  --   CL 102  --   CO2 25  --   GLUCOSE 113*  --   BUN 18  --   CREATININE 0.70 1.08  CALCIUM 9.0  --    PT/INR  Recent Labs  03/01/14 0450  LABPROT 15.3*  INR 1.21   ABG No results found for this basename: PHART, PCO2, PO2, HCO3,  in the last 72 hours  Studies/Results: Ir Perc Tun Perit Cath W/port  03/01/2014   CLINICAL DATA:  Metastatic lung cancer with symptomatic recurrent malignant ascites.  EXAM: INSERTION OF TUNNELED PERITONEAL DRAINAGE CATHETER  ANESTHESIA/SEDATION: 0.5 mg IV Versed; 50 mcg IV Fentanyl.  Total Moderate Sedation Time  20 minutes.  MEDICATIONS: 2 g IV Ancef. As antibiotic prophylaxis, Ancef was ordered pre-procedure and administered intravenously within one hour of incision.  FLUOROSCOPY TIME:  6 seconds.  PROCEDURE: The procedure, risks, benefits, and alternatives were explained to the patient. Questions regarding the procedure were encouraged and answered. The patient understands and consents to the procedure.  The right abdominal wall was prepped with chlorhexidine in a  sterile fashion, and a sterile drape was applied covering the operative field. A sterile gown and sterile gloves were used for the procedure. Local anesthesia was provided with 1% Lidocaine. Ultrasound image documentation was performed. Fluoroscopy during the procedure and fluoroscopic spot radiograph confirms appropriate catheter position.  After creating a small skin incision, a 19 gauge sheath needle was advanced into the peritoneal cavity under ultrasound guidance. A guide wire was then advanced under fluoroscopy into the peritoneal cavity. Peritoneal access was dilated serially and a 16-French peel-away sheath placed.  A 16 French tunneled PleurX catheter was placed. This was tunneled from an incision 5 cm below the peritoneal access to the access site. The catheter was advanced through the peel-away sheath. The sheath was then removed. Final catheter positioning was confirmed with a fluoroscopic spot image.  The peritoneal access incision was closed with subcutaneous 3-0 Monocryl and subcuticular 4-0 Vicryl. Dermabond was applied to the incision. A Prolene retention suture was applied at the catheter exit site. Large volume paracentesis was performed through the new catheter utilizing drainage bottles.  COMPLICATIONS: None.  FINDINGS: The catheter was placed via the right abdominal wall. Catheter course is transverse in the peritoneal cavity. Approximately 2.1 liters of ascites was able to be removed after catheter placement.  IMPRESSION: Placement of tunneled peritoneal drainage catheter via right abdominal approach. 2.1 liters of ascites was removed today after catheter placement.   Electronically Signed  By: Aletta Edouard M.D.   On: 03/01/2014 12:03    Anti-infectives: Anti-infectives   Start     Dose/Rate Route Frequency Ordered Stop   03/01/14 1000  ceFAZolin (ANCEF) IVPB 2 g/50 mL premix     2 g 100 mL/hr over 30 Minutes Intravenous On call 02/28/14 1017 03/01/14 1050   02/27/14 1100   fluconazole (DIFLUCAN) IVPB 200 mg     200 mg 100 mL/hr over 60 Minutes Intravenous Daily 02/27/14 0941        Assessment/Plan: S/p tunneled perit drain placement 7/22 with 2 liters ascites removed ; drain ascites prn every 2-3 days; other plans as per primary /pall care  LOS: 8 days    Yovanni Frenette,D [facility] 03/02/2014

## 2014-03-02 NOTE — Progress Notes (Signed)
Nutrition Brief Note  Chart reviewed. Pt being followed by palliative care, reported poor prognosis and possible passing within weeks. Receiving pain management and nausea/anxiety medications PRN. Boost TID has been ordered. No further nutrition interventions warranted at this time.  Please re-consult as needed.   Atlee Abide MS RD LDN Clinical Dietitian [phone]

## 2014-03-02 NOTE — Progress Notes (Signed)
Progress Note from the Palliative Medicine Team at [place]: Mr. Geoffrey Richardson is complaining of 7/10 to 10/10 epigastric into right flank and bilateral sub-scapular back pain. This pain becomes unbearable with any food/liquid intake. He tells me that he was unable to get his pain medication last night in a timely manner - I did speak with his nurse about this and she says that last night when nursing went to give his pain medication he was asleep and snoring so they held medication until he called again. I encourage nursing to communicate openly with him and/or write on his board in room when pain medication given/held hoping this may eliminate some of the friction. [name] is having an extremely difficult time processing and is not accepting that he is nearing end of life and has some anger associated with this. I have had frank conversations with his [place] and sister [name] about his poor prognosis likely weeks to months.   I also had a frank conversation with [name] this morning. I told him that if he tells [name] that he is tired of fighting his pain and is accepting of a shift in his care to comfort that we can liberalize his pain medication but because of his COPD and concern for hypercarbia our hands are somewhat tied in the aggressive pain management that he desires and requires to make his pain more tolerable. I told him that he has to tell us if it becomes too much and he desires shift to comfort. He nods his head "yes" that he understands what I am telling him but is not prepared to make this decision to shift to comfort now.    Objective: Allergies  Allergen Reactions  . Morphine And Related Nausea And Vomiting and Other (See Comments)    dizziness   Scheduled Meds: . bisacodyl  10 mg Rectal Once  . budesonide-formoterol  1 puff Inhalation Daily  . enoxaparin (LOVENOX) injection  40 mg Subcutaneous QHS  . fluconazole (DIFLUCAN) IV  200 mg Intravenous Daily  .  hydrALAZINE  25 mg Oral 3 times per day  . labetalol  100 mg Oral BID  . lactose free nutrition  237 mL Oral TID  . lidocaine  2 patch Transdermal q morning - 10a  . magic mouthwash  10 mL Oral QID  . metoCLOPramide  10 mg Oral TID AC & HS  . multivitamin with minerals  1 tablet Oral Daily  . pantoprazole (PROTONIX) IV  40 mg Intravenous Daily  . senna-docusate  1 tablet Oral Daily   Continuous Infusions: . sodium chloride 50 mL/hr at 03/01/14 1442   PRN Meds:.acetaminophen, acetaminophen, albuterol, ALPRAZolam, bisacodyl, chlorproMAZINE (THORAZINE) IV, chlorproMAZINE, gi cocktail, HYDROmorphone (DILAUDID) injection, ondansetron (ZOFRAN) IV, ondansetron, oxyCODONE, polyethylene glycol  BP 125/93  Pulse 115  Temp(Src) 97.9 F (36.6 C) (Oral)  Resp 15  Ht 6' (1.829 m)  Wt 92.942 kg (204 lb 14.4 oz)  BMI 27.78 kg/m2  SpO2 98%   PPS: 30%  Pain Score: 7/10  Pain Location epigastric into right flank, sub-scapular back    Intake/Output Summary (Last 24 hours) at 03/02/14 0853 Last data filed at 03/02/14 0645  Gross per 24 hour  Intake 1576.67 ml  Output    300 ml  Net 1276.67 ml      LBM: 02/20/14 charted, pt claims 02/27/14?    Stool [place]: yes  Physical Exam:  General: NAD, cachectic, ill appearing, severe pain  HEENT: Severe temporal muscle  wasting, no JVD, oral mucosa moist, thrush much improved Chest: No labored breathing, symmetric  CVS: Tachycardic  Abdomen: Distended, tender to palpation, +BS  Ext: MAE, 2+ BLE edema, warm to touch  Neuro: Awake, oriented x 3    Labs: CBC    Component Value Date/Time   WBC 6.9 03/01/2014 0450   WBC 6.1 12/13/2013 0803   RBC 4.70 03/01/2014 0450   RBC 4.53 12/13/2013 0803   HGB 11.8* 03/01/2014 0450   HGB 11.3* 12/13/2013 0803   HCT 39.7 03/01/2014 0450   HCT 37.0* 12/13/2013 0803   PLT 197 03/01/2014 0450   PLT 267 12/13/2013 0803   MCV 84.5 03/01/2014 0450   MCV 81.8 12/13/2013 0803   MCH 25.1* 03/01/2014 0450   MCH 24.9* 12/13/2013 0803    MCHC 29.7* 03/01/2014 0450   MCHC 30.5* 12/13/2013 0803   RDW 18.3* 03/01/2014 0450   RDW 18.5* 12/13/2013 0803   LYMPHSABS 0.9 02/22/2014 0922   LYMPHSABS 1.5 12/13/2013 0803   MONOABS 0.6 02/22/2014 0922   MONOABS 0.4 12/13/2013 0803   EOSABS 0.0 02/22/2014 0922   EOSABS 0.0 12/13/2013 0803   BASOSABS 0.0 02/22/2014 0922   BASOSABS 0.0 12/13/2013 0803    BMET    Component Value Date/Time   NA 140 02/28/2014 0003   NA 144 12/13/2013 0803   K 4.4 02/28/2014 0003   K 4.1 12/13/2013 0803   CL 102 02/28/2014 0003   CL 103 01/19/2013 1008   CO2 25 02/28/2014 0003   CO2 26 12/13/2013 0803   GLUCOSE 113* 02/28/2014 0003   GLUCOSE 109 12/13/2013 0803   GLUCOSE 129* 01/19/2013 1008   BUN 18 02/28/2014 0003   BUN 16.1 12/13/2013 0803   CREATININE 1.08 03/01/2014 0450   CREATININE 0.8 12/13/2013 0803   CALCIUM 9.0 02/28/2014 0003   CALCIUM 9.5 12/13/2013 0803   GFRNONAA 77* 03/01/2014 0450   GFRAA 89* 03/01/2014 0450    CMP     Component Value Date/Time   NA 140 02/28/2014 0003   NA 144 12/13/2013 0803   K 4.4 02/28/2014 0003   K 4.1 12/13/2013 0803   CL 102 02/28/2014 0003   CL 103 01/19/2013 1008   CO2 25 02/28/2014 0003   CO2 26 12/13/2013 0803   GLUCOSE 113* 02/28/2014 0003   GLUCOSE 109 12/13/2013 0803   GLUCOSE 129* 01/19/2013 1008   BUN 18 02/28/2014 0003   BUN 16.1 12/13/2013 0803   CREATININE 1.08 03/01/2014 0450   CREATININE 0.8 12/13/2013 0803   CALCIUM 9.0 02/28/2014 0003   CALCIUM 9.5 12/13/2013 0803   PROT 7.8 02/22/2014 0922   PROT 8.3 12/13/2013 0803   ALBUMIN 2.6* 02/22/2014 0922   ALBUMIN 3.1* 12/13/2013 0803   AST 17 02/22/2014 0922   AST 10 12/13/2013 0803   ALT 12 02/22/2014 0922   ALT <6 12/13/2013 0803   ALKPHOS 125* 02/22/2014 0922   ALKPHOS 94 12/13/2013 0803   BILITOT 0.4 02/22/2014 0922   BILITOT 0.21 12/13/2013 0803   GFRNONAA 77* 03/01/2014 0450   GFRAA 89* 03/01/2014 0450    Assessment and Plan:  1. Code Status: FULL 2. Symptom Control:  1. Anxiety: Alprazolam 0.5 mg TID prn.  2. Nausea: Ondansetron  prn.  3. Bowel Regimen: Senokot-S daily. Miralax prn. Sorbitol x 1 yesterday. Dulcolax supp daily prn - will give dose today if he allows.  4. Pain:  1. Lidoderm patches under scapulas for back pain.  2. Continue to hold Oxycontin. He moans  in pain but his eyelids are also halfway closed and eyes rolling back in his head. He is more alert today than yesterday. Discussion with him that we are unable to achieve full comfort for him as well as continued aggressive care to extend his life although we are attempting a balance to the best of our abilities.  3. Oxycodone 10 mg every 4 hours prn. HOLD for sedation.  4. Dilaudid 0.5 mg every 3 hours prn. HOLD FOR SEDATION.  3. Psycho/Social: Emotional support provided to patient and family/friends.  4. Spiritual: Chaplain following.  5. Disposition: To be determined on outcomes.      Time In Time Out Total Time Spent with Patient Total Overall Time  0845 0930 68min 21min    Greater than 50%  of this time was spent counseling and coordinating care related to the above assessment and plan.    Vinie Sill, NP Palliative Medicine Team Pager # [phone] (M-F 8a-5p) Team Phone # [phone] (Nights/Weekends)

## 2014-03-03 MED ORDER — RANITIDINE HCL 150 MG/10ML PO SYRP
150.0000 mg | ORAL_SOLUTION | Freq: Two times a day (BID) | ORAL | Status: DC | PRN
  Administered 2014-03-03 (×2): 150 mg via ORAL
  Filled 2014-03-03 (×2): qty 10

## 2014-03-03 MED ORDER — FENTANYL 25 MCG/HR TD PT72
25.0000 ug | MEDICATED_PATCH | TRANSDERMAL | Status: DC
  Administered 2014-03-03 – 2014-03-06 (×2): 25 ug via TRANSDERMAL
  Filled 2014-03-03 (×2): qty 1

## 2014-03-03 NOTE — Progress Notes (Signed)
Patient ID: Geoffrey Richardson, male   DOB: 1961-01-24, 53 y.o.   MRN: [id number] [facility] PROGRESS NOTE  Nadir Vasques JSE:[id number] DOB: [birth year] DOA: 02/22/2014 PCP: Philis Fendt, MD  Brief narrative: 53 y.o. male a PMH of non-small cell lung cancer with recurrent malignant pleural effusion status post pleural catheter insertion 01/15/2013 (subsequently removed after became infected) and chemotherapy with carboplatin and Taxotere on 02/17/13 status post 6 cycles which completed on 06/02/13, recent hospital admission 01/25/14-02/03/14 for evaluation of abdominal pain and early satiety. CT scan of the chest, abdomen and pelvis done during that admission showed ascites. He had a 1.4 L paracentesis done 01/26/14. Plan was to followup with oncology as an outpatient for consideration of further chemotherapy if his performance status/nutrition improved, however he has failed to thrive and was readmitted 02/22/14 with uncontrolled cancer related pain from malignant ascites and shortness of breath from a malignant pleural effusion. Palliative care has been consulted, however the patient continues to want aggressive care despite his deteriorating status and poor prognosis.   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  Pt underwent multiple paracenteses: 7/15 with 4.1 L fluid removed, 7/19 with 2.5 L and 7/22 with 2.1 L fluid removed. Hes [peritoneal catheter and every other day drainage. Current pain regimen: Lidocaine patch, dilaudid 0.5 mg IV every 3 hours PRN, oxycodone 10 mg PO Q 4 hours PRN moderate pain.  Active Problems:  Oral Thrush  Placed on Diflucan and Magic mouthwash. Oral thrush somewhat better. Benign essential hypertension  Continue hydralazine and labetalol. COPD (chronic obstructive pulmonary disease)/chronic  respiratory failure with hypoxia / dyspnea  Continue Symbicort. Continue bronchodilators as needed.  Respiratory status stable. Severe protein calorie malnutrition  Pt meets criteria for severe malnutrition in the context of chronic illness as evidenced by PO intake < 75% for > one month, 6.8% body weight loss in one month.  Secondary to cancer related cachexia. Boost nutritional supplements ordered. Reglan ordered 10 mg IV every 8 hours  Malignant pleural effusion  Effusion is loculated and cannot be drained per interventional radiology. Functional quadriplegia  Secondary to nature of the malignancy and progressive decline  Metastatic Lung cancer  Seen by Dr. [name] 02/22/14. Poor prognosis with no recommendations for chemotherapy.  Palliative care team following  DVT prophylaxis  Lovenox sub Q while pt is in hospital    Code Status: Full.  Family Communication: [name] (close friend): [phone] updated at bedside 02/24/14. No family at bedside today.  Disposition Plan: Home when stable.   IV access:  Peripheral IV Consultants:  Dr. Zola Button, Oncology.  Dr. Billey Chang, [place] Interventional [facility]  Procedures:  Acute abdominal series 02/22/14: Stable chronic findings in the right hemithorax. No acute cardiopulmonary process. Mild progression of small bowel distention with ascites and peritoneal nodularity on CT concerning for underlying peritoneal carcinomatosis.  Ultrasound guided paracentesis 02/22/14: Successful ultrasound-guided paracentesis yielding 4.1 L of ascites.  Ultrasound of the chest 02/22/14: Chronic multiloculated pleural effusions. Patient had prior Pleurx catheter to the right side. This was removed due to loculation of the effusion and no output from the catheter. The patient may have a trapped right lung and thoracentesis could result in a pneumothorax and therefore was not done.  US guided paracentesis 03/01/2014 with 2.1 L fluid  removed Peritoneal catheter placed 03/01/2014  Other consultants:  Physical therapy  Dietician Antibiotics:  None    Leisa Lenz, MD  [facility] Pager [phone]  If 7PM-7AM, please contact night-coverage [email or web address] Password [email or web address] 03/03/2014, 9:22 AM   LOS: 9 days    HPI/Subjective: No acute overnight events.  Objective: Filed Vitals:   03/02/14 2232 03/02/14 2337 03/03/14 0500 03/03/14 0841  BP:  129/97 123/91   Pulse:  123 122   Temp:   97.9 F (36.6 C)   TempSrc:   Oral   Resp:  18 18   Height:      Weight:   92.035 kg (202 lb 14.4 oz)   SpO2: 97% 99% 94% 100%    Intake/Output Summary (Last 24 hours) at 03/03/14 7262 Last data filed at 03/03/14 0355  Gross per 24 hour  Intake 2091.84 ml  Output    400 ml  Net 1691.84 ml    Exam:   General:  Pt is alert, follows commands appropriately, not in acute distress; oral thrush  Cardiovascular: Regular rate and rhythm, S1/S2 appreciated  Respiratory: Clear to auscultation bilaterally, no wheezing  Abdomen: distended, bowel sounds present  Extremities: LE +2 pitting edema, pulses DP and PT palpable bilaterally  Neuro: Grossly nonfocal  Data Reviewed: Basic Metabolic Panel:  Recent Labs Lab 02/28/14 0003 03/01/14 0450  NA 140  --   K 4.4  --   CL 102  --   CO2 25  --   GLUCOSE 113*  --   BUN 18  --   CREATININE 0.70 1.08  CALCIUM 9.0  --    Liver Function Tests: No results found for this basename: AST, ALT, ALKPHOS, BILITOT, PROT, ALBUMIN,  in the last 168 hours No results found for this basename: LIPASE, AMYLASE,  in the last 168 hours No results found for this basename: AMMONIA,  in the last 168 hours CBC:  Recent Labs Lab 02/28/14 0003 03/01/14 0450  WBC 7.3 6.9  HGB 11.8* 11.8*  HCT 40.7 39.7  MCV 83.2 84.5  PLT 203 197   Cardiac Enzymes: No results found for this basename: CKTOTAL, CKMB, CKMBINDEX, TROPONINI,  in the last 168 hours BNP: No components found with this  basename: POCBNP,  CBG:  Recent Labs Lab 02/27/14 0727  GLUCAP 97    No results found for this or any previous visit (from the past 240 hour(s)).   Studies: Ir Perc Tun Perit Cath W/port 03/01/2014   IMPRESSION: Placement of tunneled peritoneal drainage catheter via right abdominal approach. 2.1 liters of ascites was removed today after catheter placement.      Scheduled Meds: . bisacodyl  10 mg Rectal Once  . budesonide-formoterol  1 puff Inhalation Daily  . enoxaparin (LOVENOX) injection  40 mg Subcutaneous QHS  . fluconazole (DIFLUCAN) IV  200 mg Intravenous Daily  . hydrALAZINE  25 mg Oral 3 times per day  . lactose free nutrition  237 mL Oral TID  . lidocaine  2 patch Transdermal q morning - 10a  . magic mouthwash  10 mL Oral QID  . metoCLOPramide (REGLAN) injection  10 mg Intravenous 3 times per day  . multivitamin with minerals  1 tablet Oral Daily  . pantoprazole (PROTONIX) IV  40 mg Intravenous Daily  . senna-docusate  1 tablet Oral Daily   Continuous Infusions: . sodium chloride 1,000 mL (03/03/14 0647)

## 2014-03-03 NOTE — Progress Notes (Signed)
Patient with n/v episode after ambulating to bathroom and back to bed. Vomited about 300 ml of brown enteric smelling emesis. LBM 7/13. Patient reports + gas. Paged [facility] on call for hospitalist. K Black to come see patient. Patient c/o pain in abdomen and inidgestion/nausea. Unable to give prns at present due to frequency since last dose.

## 2014-03-03 NOTE — Progress Notes (Signed)
Progress Note from the Palliative Medicine Team at [facility]: Geoffrey Richardson is more awake and alert today. He is also continuing to have increased epigastric and back pain. I will add Fentanyl patch for better pain management as he is more awake and alert today. I had a good conversation with him and chaplain today. He seems more open to the possibility of approaching end of life but still not very accepting of this fact and says "I will fight until the end." Encouraged him that when we cannot strengthen the body we work to strengthen our spirit and work towards peace and understanding. He now will at least let me discuss comfort options and end of life without shutting me down. He tells me that he remembers the conversation we had yesterday regarding focusing on his pain and comfort and he has no questions about this at this point. He also tells me that he "vomitted stool last night." He told me that he would take the dulcolax suppository tonight and I spoke with [name] to encourage him to follow through with suppository when she comes tonight.    Objective: Allergies  Allergen Reactions  . Morphine And Related Nausea And Vomiting and Other (See Comments)    dizziness   Scheduled Meds: . bisacodyl  10 mg Rectal Once  . budesonide-formoterol  1 puff Inhalation Daily  . enoxaparin (LOVENOX) injection  40 mg Subcutaneous QHS  . fluconazole (DIFLUCAN) IV  200 mg Intravenous Daily  . hydrALAZINE  25 mg Oral 3 times per day  . lactose free nutrition  237 mL Oral TID  . lidocaine  2 patch Transdermal q morning - 10a  . magic mouthwash  10 mL Oral QID  . metoCLOPramide (REGLAN) injection  10 mg Intravenous 3 times per day  . multivitamin with minerals  1 tablet Oral Daily  . pantoprazole (PROTONIX) IV  40 mg Intravenous Daily  . senna-docusate  1 tablet Oral Daily   Continuous Infusions: . sodium chloride 1,000 mL (03/03/14 0647)   PRN Meds:.acetaminophen, acetaminophen, albuterol,  ALPRAZolam, bisacodyl, chlorproMAZINE (THORAZINE) IV, chlorproMAZINE, gi cocktail, HYDROmorphone (DILAUDID) injection, metoprolol, ondansetron (ZOFRAN) IV, ondansetron, oxyCODONE, polyethylene glycol, promethazine, ranitidine  BP 138/105  Pulse 129  Temp(Src) 97 F (36.1 C) (Oral)  Resp 20  Ht 6' (1.829 m)  Wt 92.035 kg (202 lb 14.4 oz)  BMI 27.51 kg/m2  SpO2 97%   PPS: 30%  Pain Score: 8/10 Pain Location: pain more in back now   Intake/Output Summary (Last 24 hours) at 03/03/14 1538 Last data filed at 03/03/14 1420  Gross per 24 hour  Intake 2643.84 ml  Output    200 ml  Net 2443.84 ml      LBM: 02/20/14 last documented     Physical Exam:  General: NAD, cachectic, ill appearing, severe pain  HEENT: Severe temporal muscle wasting, no JVD, oral mucosa moist, thrush much improved  Chest: No labored breathing, symmetric  CVS: Tachycardic  Abdomen: Distended, tender to palpation, +BS  Ext: MAE, 2+ BLE edema, warm to touch  Neuro: Awake, alert, oriented x 3, follows commands    Labs: CBC    Component Value Date/Time   WBC 6.9 03/01/2014 0450   WBC 6.1 12/13/2013 0803   RBC 4.70 03/01/2014 0450   RBC 4.53 12/13/2013 0803   HGB 11.8* 03/01/2014 0450   HGB 11.3* 12/13/2013 0803   HCT 39.7 03/01/2014 0450   HCT 37.0* 12/13/2013 0803   PLT 197 03/01/2014 0450  PLT 267 12/13/2013 0803   MCV 84.5 03/01/2014 0450   MCV 81.8 12/13/2013 0803   MCH 25.1* 03/01/2014 0450   MCH 24.9* 12/13/2013 0803   MCHC 29.7* 03/01/2014 0450   MCHC 30.5* 12/13/2013 0803   RDW 18.3* 03/01/2014 0450   RDW 18.5* 12/13/2013 0803   LYMPHSABS 0.9 02/22/2014 0922   LYMPHSABS 1.5 12/13/2013 0803   MONOABS 0.6 02/22/2014 0922   MONOABS 0.4 12/13/2013 0803   EOSABS 0.0 02/22/2014 0922   EOSABS 0.0 12/13/2013 0803   BASOSABS 0.0 02/22/2014 0922   BASOSABS 0.0 12/13/2013 0803    BMET    Component Value Date/Time   NA 140 02/28/2014 0003   NA 144 12/13/2013 0803   K 4.4 02/28/2014 0003   K 4.1 12/13/2013 0803   CL 102 02/28/2014  0003   CL 103 01/19/2013 1008   CO2 25 02/28/2014 0003   CO2 26 12/13/2013 0803   GLUCOSE 113* 02/28/2014 0003   GLUCOSE 109 12/13/2013 0803   GLUCOSE 129* 01/19/2013 1008   BUN 18 02/28/2014 0003   BUN 16.1 12/13/2013 0803   CREATININE 1.08 03/01/2014 0450   CREATININE 0.8 12/13/2013 0803   CALCIUM 9.0 02/28/2014 0003   CALCIUM 9.5 12/13/2013 0803   GFRNONAA 77* 03/01/2014 0450   GFRAA 89* 03/01/2014 0450    CMP     Component Value Date/Time   NA 140 02/28/2014 0003   NA 144 12/13/2013 0803   K 4.4 02/28/2014 0003   K 4.1 12/13/2013 0803   CL 102 02/28/2014 0003   CL 103 01/19/2013 1008   CO2 25 02/28/2014 0003   CO2 26 12/13/2013 0803   GLUCOSE 113* 02/28/2014 0003   GLUCOSE 109 12/13/2013 0803   GLUCOSE 129* 01/19/2013 1008   BUN 18 02/28/2014 0003   BUN 16.1 12/13/2013 0803   CREATININE 1.08 03/01/2014 0450   CREATININE 0.8 12/13/2013 0803   CALCIUM 9.0 02/28/2014 0003   CALCIUM 9.5 12/13/2013 0803   PROT 7.8 02/22/2014 0922   PROT 8.3 12/13/2013 0803   ALBUMIN 2.6* 02/22/2014 0922   ALBUMIN 3.1* 12/13/2013 0803   AST 17 02/22/2014 0922   AST 10 12/13/2013 0803   ALT 12 02/22/2014 0922   ALT <6 12/13/2013 0803   ALKPHOS 125* 02/22/2014 0922   ALKPHOS 94 12/13/2013 0803   BILITOT 0.4 02/22/2014 0922   BILITOT 0.21 12/13/2013 0803   GFRNONAA 77* 03/01/2014 0450   GFRAA 89* 03/01/2014 0450     Assessment and Plan:  1. Code Status: FULL 2. Symptom Control:  1. Anxiety: Alprazolam 0.5 mg TID prn.  2. Nausea: Ondansetron prn.  3. Bowel Regimen: Senokot-S daily. Miralax prn. Dulcolax supp daily prn - will give dose today.  4. Pain:  1. Lidoderm patches under scapulas for back pain.  2. Begin Fentanyl duragesic 25 mcg patch.   3. Oxycodone 10 mg every 4 hours prn. HOLD for sedation.  4. Dilaudid 0.5 mg every 3 hours prn. HOLD FOR SEDATION.  3. Psycho/Social: Emotional support provided to patient and family/friends.  4. Spiritual: Chaplain following.  5. Disposition: To be determined on outcomes. Hopeful for home.      Time In Time Out Total Time Spent with Patient Total Overall Time  1510 1545 79min 28min    Greater than 50%  of this time was spent counseling and coordinating care related to the above assessment and plan.  Vinie Sill, NP Palliative Medicine Team Pager # [phone] (M-F 8a-5p) Team Phone # [phone] (Nights/Weekends)
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
## 2014-03-04 DIAGNOSIS — Z515 Encounter for palliative care: Secondary | ICD-10-CM

## 2014-03-04 DIAGNOSIS — F411 Generalized anxiety disorder: Secondary | ICD-10-CM

## 2014-03-04 MED ORDER — PANTOPRAZOLE SODIUM 40 MG PO TBEC
40.0000 mg | DELAYED_RELEASE_TABLET | Freq: Every day | ORAL | Status: DC
  Administered 2014-03-05 – 2014-03-08 (×4): 40 mg via ORAL
  Filled 2014-03-04 (×4): qty 1

## 2014-03-04 MED ORDER — POLYETHYLENE GLYCOL 3350 17 G PO PACK
17.0000 g | PACK | Freq: Every day | ORAL | Status: DC
  Filled 2014-03-04 (×5): qty 1

## 2014-03-04 MED ORDER — SUCRALFATE 1 GM/10ML PO SUSP
1.0000 g | Freq: Three times a day (TID) | ORAL | Status: DC | PRN
  Filled 2014-03-04 (×3): qty 10

## 2014-03-04 NOTE — Progress Notes (Addendum)
Patient ID: Geoffrey Richardson, male   DOB: Dec 14, 1960, 53 y.o.   MRN: [id number] [facility] PROGRESS NOTE  Lindel Marcell SWN:[id number] DOB: 1960-11-17 DOA: 02/22/2014 PCP: Philis Fendt, MD  Brief narrative: 53 y.o. male a PMH of non-small cell lung cancer with recurrent malignant pleural effusion status post pleural catheter insertion 01/15/2013 (subsequently removed after became infected) and chemotherapy with carboplatin and Taxotere on 02/17/13 status post 6 cycles which he has completed on 06/02/13, recent hospital admission 01/25/14 - 02/03/14 for evaluation of abdominal pain and early satiety. CT scan of the chest, abdomen and pelvis done during that admission showed ascites. He had a 1.4 L paracentesis done 01/26/14. Plan was to followup with oncology as an outpatient for consideration of further chemotherapy if his performance status/nutrition improved, however he has failed to thrive and was readmitted 02/22/14 with uncontrolled cancer related pain from malignant ascites and shortness of breath from a malignant pleural effusion. Palliative care has been consulted, however the patient continues to want aggressive care despite his deteriorating status and poor prognosis.   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 Per patient's caregiver she confirms full code status. Pt underwent multiple paracenteses: 7/15 with 4.1 L fluid removed, 7/19 with 2.5 L and 7/22 with 2.1 L fluid removed. Now has peritoneal catheter with drainage every other day. Current pain regimen: Lidocaine and fentanyl patch, dilaudid 0.5 mg IV every 3 hours PRN, oxycodone 10 mg PO Q 4 hours PRN moderate pain.  Active Problems:  Oral Thrush  Placed on Diflucan and Magic mouthwash. Oral thrush much better. WIll stop fluconazole in next 24 hours.  Benign  essential hypertension  Continue hydralazine 25 mg PO Q 8 hours.  COPD (chronic obstructive pulmonary disease)/chronic respiratory failure with hypoxia / dyspnea  Continue Symbicort. Albuterol every 4 hours PRN shortness of breath or wheezing Respiratory status stable. Severe protein calorie malnutrition  Pt meets criteria for severe malnutrition in the context of chronic illness as evidenced by PO intake < 75% for > one month, 6.8% body weight loss in one month.  Secondary to cancer related cachexia. Boost nutritional supplements ordered.  PRN meds for indigestion / nausea or vomiting: GI cocktail, Zofran, sucralfate, zantac, thorazine, ativan. All these are PRN but pt gets them preety regularly due to ongoing nausea and occasional vomiting.  Reglan is scheduled 10 mg IV every 8 hours. Continue protonix 40 mg daily.  Malignant pleural effusion  Effusion is loculated and cannot be drained per interventional radiology. Functional quadriplegia  Secondary to nature of the malignancy and progressive decline. PT if pt able to participate. Metastatic Lung cancer  Seen by Dr. [name] 02/22/14. Poor prognosis with Richardson recommendations for chemotherapy.  Palliative care team following  Constipation  Had 1 BM 7/24. Bowel regimen: senna daily, miralax daily.  DVT prophylaxis  Lovenox sub Q while pt is in hospital    Code Status: Full. Patient's caregiver [name] has confirmed full code status.  Family Communication: [name] (caregiver): [phone] updated family at the bedside daily Disposition Plan: Home when pain better controlled.  IV access:  Peripheral IV Consultants:  Dr. Zola Button, Oncology.  Dr. Billey Chang, [facility]  Procedures:  Acute abdominal series 02/22/14: Stable chronic findings in the right hemithorax. Richardson acute cardiopulmonary process. Mild progression of small bowel distention with ascites and peritoneal  nodularity on CT  concerning for underlying peritoneal carcinomatosis.  Ultrasound guided paracentesis 02/22/14: Successful ultrasound-guided paracentesis yielding 4.1 L of ascites.  Ultrasound of the chest 02/22/14: Chronic multiloculated pleural effusions. Patient had prior Pleurx catheter to the right side. This was removed due to loculation of the effusion and Richardson output from the catheter. The patient may have a trapped right lung and thoracentesis could result in a pneumothorax and therefore was not done.  US guided paracentesis 03/01/2014 with 2.1 L fluid removed  Peritoneal catheter placed 03/01/2014  Other consultants:  Physical therapy  Dietician Antibiotics:  None   Leisa Lenz, MD  [facility] Pager [phone]  If 7PM-7AM, please contact night-coverage [email or web address] Password [facility] 03/04/2014, 8:38 AM   LOS: 10 days    HPI/Subjective: Richardson acute overnight events.  Objective: Filed Vitals:   03/03/14 0841 03/03/14 1404 03/03/14 2109 03/04/14 0516  BP:  138/105 126/93 119/84  Pulse:  129 126 127  Temp:  97 F (36.1 C) 98.1 F (36.7 C) 98.3 F (36.8 C)  TempSrc:  Oral Oral Oral  Resp:  20 22 21   Height:      Weight:      SpO2: 100% 97% 97% 97%    Intake/Output Summary (Last 24 hours) at 03/04/14 2637 Last data filed at 03/03/14 2109  Gross per 24 hour  Intake   1512 ml  Output    100 ml  Net   1412 ml    Exam:   General:  Pt is alert, follows commands appropriately, not in acute distress; oral thrush is improving  Cardiovascular: Regular rate and rhythm, S1/S2 appreciated   Respiratory: coarse breath sounds bilaterally, Richardson wheezing   Abdomen:  distended, bowel sounds present  Extremities: Richardson edema, pulses DP and PT palpable bilaterally  Neuro: Grossly nonfocal  Data Reviewed: Basic Metabolic Panel:  Recent Labs Lab 02/28/14 0003 03/01/14 0450  NA 140  --   K 4.4  --   CL 102  --   CO2 25  --   GLUCOSE 113*  --   BUN 18  --   CREATININE 0.70 1.08   CALCIUM 9.0  --    Liver Function Tests: Richardson results found for this basename: AST, ALT, ALKPHOS, BILITOT, PROT, ALBUMIN,  in the last 168 hours Richardson results found for this basename: LIPASE, AMYLASE,  in the last 168 hours Richardson results found for this basename: AMMONIA,  in the last 168 hours CBC:  Recent Labs Lab 02/28/14 0003 03/01/14 0450  WBC 7.3 6.9  HGB 11.8* 11.8*  HCT 40.7 39.7  MCV 83.2 84.5  PLT 203 197   Cardiac Enzymes: Richardson results found for this basename: CKTOTAL, CKMB, CKMBINDEX, TROPONINI,  in the last 168 hours BNP: No components found with this basename: POCBNP,  CBG:  Recent Labs Lab 02/27/14 0727  GLUCAP 97    Richardson results found for this or any previous visit (from the past 240 hour(s)).   Studies: Richardson results found.  Scheduled Meds: . bisacodyl  10 mg Rectal Once  . budesonide-formoterol  1 puff Inhalation Daily  . enoxaparin (LOVENOX) injection  40 mg Subcutaneous QHS  . fentaNYL  25 mcg Transdermal Q72H  . fluconazole (DIFLUCAN)   200 mg Intravenous Daily  . hydrALAZINE  25 mg Oral 3 times per day  . lactose free nutrition  237 mL Oral TID  . lidocaine  2 patch Transdermal q morning - 10a  . magic mouthwash  10 mL Oral QID  .  metoCLOPramide (REGLAN) injection  10 mg Intravenous 3 times per day  . multivitamin   1 tablet Oral Daily  . IVpantoprazole   40 mg Intravenous Daily  . senna-docusate  1 tablet Oral Daily   Continuous Infusions: . sodium chloride 1,000 mL (03/04/14 0404)

## 2014-03-04 NOTE — Progress Notes (Signed)
Patient Geoffrey Richardson      DOB: 09/13/1960      XIP:[id number]   Palliative Medicine Team at [facility] Progress Note    Subjective: Pain doing a bit better since starting fentanyl patch. Reports most of pain today is between chest and stomach in epigastrium. often has a burning sensation which is worse with eating.  Feels like dilaudid and oxycodone helping.  Talked a lot about his emotions today. Desire to keep fighting, knowing what is going on, wanting to be here for family and expected grandchild.      Filed Vitals:   03/04/14 0516  BP: 119/84  Pulse: 127  Temp: 98.3 F (36.8 C)  Resp: 21   Physical exam: GEN: Alert, NAD HEENT: [place], sclera anicteric, mmm CV:  Tachy, regular LUNGS: CTAB, symm exp ABD: distended, RLQ peritoneal fluid drain.  Mild diffuse tenderness EXT: warm/dry PSYCH: difficult for him to make eye contact, emotional during conversation    Assessment and plan: 53 yo male with Stage IV NSCLC complicated by malignant pleural effusion and ascites s/p peritoneal fluid drain. Loculated plerual effusions not drainable  1. Code Status- Full Code. Did not explore today.   2. GOC- My colleagues Dr Lovena Le and Vinie Sill NP have been working closely with [name] on this challenging issue.  He appears distant today and alludes to need to keep fighting. Built some rapport today.  He fears dying and wants to be here for family, especially daughter Geoffrey Richardson who is expecting a child. Also close with his sister Geoffrey Richardson and caregiver [name].  While he alludes to "fighting this", he also acknowledges he "knows what is going".  Had a family member that died at home surrounded by loved ones with hospice care and he would want his time to look like that when it comes. Would want to be at home where his caregiver [name] cares for him.  In asking what it would take for him to be able to leave hospital, he reports improved strength and pain control.  [name] continues to struggle  through these emotions. I will follow-up tomorrow to see if we can build on todays conversations, though will need to be cautious in advancing these conversations.  Vinie Sill will be back on Monday and probably has best rapport with him thus far.   3. Symptom Management  A. Pain- cancer related, possible reflux and poor motility playing role as well. Tolerating fentanyl patch 89mcg/hr well and should reach peak effects today. Used 3mg  IV dilaudid in past 24 hours and 10mg  oxycodone.  On schedule reglan and PPI as well as PRN GI Cocktail. Willa dd carafate to see if this helps.  On bowel regimen and reports BMx2 this week. Ascites fluid drainage PRN for comfort  B. Anxiety- Alprazolam TID PRN. Continued emotional/spiritual support       Time In: 935  Time Out: 1010 Total Time: 35 Minutes  >50% of time spent in counselling and coordination of care.   Doran Clay D.O. Palliative Medicine Team at [facility]  Pager: [phone] Team Phone: [phone]

## 2014-03-04 NOTE — Progress Notes (Signed)
Pleurex cath drained per order, patient tolerated procedure well, no complaints of discomfort.  1500 cc of clear amber fluid removed and dressing changed. Will continue to monitor.

## 2014-03-05 DIAGNOSIS — R066 Hiccough: Secondary | ICD-10-CM

## 2014-03-05 LAB — BASIC METABOLIC PANEL
Anion gap: 15 (ref 5–15)
BUN: 25 mg/dL — ABNORMAL HIGH (ref 6–23)
CO2: 24 mEq/L (ref 19–32)
Calcium: 8.7 mg/dL (ref 8.4–10.5)
Chloride: 105 mEq/L (ref 96–112)
Creatinine, Ser: 0.75 mg/dL (ref 0.50–1.35)
GFR calc non Af Amer: >90 mL/min (ref >90)
GLUCOSE: 94 mg/dL (ref 70–99)
POTASSIUM: 4.4 meq/L (ref 3.7–5.3)
Sodium: 144 mEq/L (ref 137–147)

## 2014-03-05 LAB — CBC
HCT: 42.5 % (ref 39.0–52.0)
HEMOGLOBIN: 12.1 g/dL — AB (ref 13.0–17.0)
MCH: 24 pg — ABNORMAL LOW (ref 26.0–34.0)
MCHC: 28.5 g/dL — AB (ref 30.0–36.0)
MCV: 84.2 fL (ref 78.0–100.0)
PLATELETS: 157 10*3/uL (ref 150–400)
RBC: 5.05 MIL/uL (ref 4.22–5.81)
RDW: 18.7 % — ABNORMAL HIGH (ref 11.5–15.5)
WBC: 10.2 10*3/uL (ref 4.0–10.5)

## 2014-03-05 MED ORDER — ENSURE COMPLETE PO LIQD
237.0000 mL | Freq: Two times a day (BID) | ORAL | Status: DC
  Administered 2014-03-05 – 2014-03-08 (×6): 237 mL via ORAL

## 2014-03-05 MED ORDER — SUCRALFATE 1 GM/10ML PO SUSP
1.0000 g | Freq: Two times a day (BID) | ORAL | Status: DC
  Administered 2014-03-05 – 2014-03-08 (×7): 1 g via ORAL
  Filled 2014-03-05 (×8): qty 10

## 2014-03-05 MED ORDER — BOOST PLUS PO LIQD
237.0000 mL | Freq: Two times a day (BID) | ORAL | Status: DC
  Administered 2014-03-05: 237 mL via ORAL
  Administered 2014-03-06: 20:00:00 via ORAL
  Administered 2014-03-07 – 2014-03-08 (×2): 237 mL via ORAL
  Filled 2014-03-05 (×7): qty 237

## 2014-03-05 MED ORDER — METOCLOPRAMIDE HCL 5 MG/ML IJ SOLN
10.0000 mg | Freq: Four times a day (QID) | INTRAMUSCULAR | Status: DC
  Administered 2014-03-05 – 2014-03-08 (×13): 10 mg via INTRAVENOUS
  Filled 2014-03-05 (×14): qty 2

## 2014-03-05 NOTE — Progress Notes (Signed)
Nutrition Brief Note  RD consulted for management of nutrition supplements.  Per MD, pt requesting Ensure and Boost.  Pt is currently working with PT at time of visit.  RD notes current order for Boost TID. Will decrease Boost to BID, and add Ensure Complete between meals. Pt will now receive 4 supplements daily.  No further nutrition interventions warranted at this time.   Please continue to re-consult as needed.   Weekend RD coverage:  Brynda Greathouse, MS RD LDN Weekend/After hours pager: [phone]

## 2014-03-05 NOTE — Progress Notes (Signed)
Patient ID: Geoffrey Richardson, male   DOB: 10-Aug-1961, 53 y.o.   MRN: [id number] [facility] PROGRESS NOTE  Cire Clute GYF:[id number] DOB: [birth year] DOA: 02/22/2014 PCP: Philis Fendt, MD  Brief narrative: 53 y.o. male a PMH of non-small cell lung cancer with recurrent malignant pleural effusion status post pleural catheter insertion 01/15/2013 (subsequently removed after became infected) and chemotherapy with carboplatin and Taxotere on 02/17/13 status post 6 cycles which he has completed on 06/02/13, recent hospital admission 01/25/14 - 02/03/14 for evaluation of abdominal pain and early satiety. CT scan of the chest, abdomen and pelvis done during that admission showed ascites. He had a 1.4 L paracentesis done 01/26/14. Plan was to followup with oncology as an outpatient for consideration of further chemotherapy if his performance status/nutrition improved, however he has failed to thrive and was readmitted 02/22/14 with uncontrolled cancer related pain from malignant ascites and shortness of breath from a malignant pleural effusion. Palliative care has been consulted, however the patient continues to want aggressive care despite his deteriorating status and poor prognosis.   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 Per patient's caregiver she confirmsed full code status.  Pt underwent multiple paracenteses: 7/15 with 4.1 L fluid removed, 7/19 with 2.5 L and 7/22 with 2.1 L fluid removed. Now has peritoneal catheter with drainage every other day. Last time drain 03/04/2014. Current pain regimen: Lidocaine and fentanyl patch, dilaudid 0.5 mg IV every 3 hours PRN, oxycodone 10 mg PO Q 4 hours PRN moderate pain. Adjuvant therapy: xanax 0.5 mg PO TID PRN Active Problems:  Oral Thrush  Placed on Diflucan and Magic mouthwash. Oral  thrush resolved and fluconazole stopped today 03/05/2014.  We will continue magic mouthwash QID. Benign essential hypertension  Continue hydralazine 25 mg PO Q 8 hours.  COPD (chronic obstructive pulmonary disease)/chronic respiratory failure with hypoxia / dyspnea  Continue Symbicort. Albuterol every 4 hours PRN shortness of breath or wheezing  Respiratory status stable. Severe protein calorie malnutrition  Pt meets criteria for severe malnutrition in the context of chronic illness as evidenced by PO intake < 75% for > one month, 6.8% body weight loss in one month.  Secondary to cancer related cachexia. Boost nutritional supplements ordered.  PRN meds for indigestion / nausea or vomiting: GI cocktail, Zofran or phenergan, zantac, thorazine, ativan. All these are PRN but pt gets them preety regularly due to ongoing nausea. Reglan is scheduled 10 mg IV every 8 hours. Continue protonix 40 mg daily. Continue sucralfate 1 gm PO BID. Malignant pleural effusion  Effusion is loculated and cannot be drained per interventional radiology. Functional quadriplegia  Secondary to nature of the malignancy and progressive decline.  PT eval pending. Metastatic Lung cancer  Seen by Dr. [name] 02/22/14. Poor prognosis with no recommendations for chemotherapy.  Palliative care team following  Constipation  Had 1 BM 7/24. Bowel regimen: senna daily, miralax daily.  DVT prophylaxis  Lovenox sub Q while pt is in hospital    Code Status: Full. Patient's caregiver [name] has confirmed full code status.  Family Communication: [name] (caregiver): [phone] updated family at the bedside daily  Disposition Plan: Home when pain better controlled.   IV access:  Peripheral IV Consultants:  Dr. Zola Button, Oncology.  Dr. Billey Chang, [place], Interventional Radiology  Procedures:  Acute abdominal series 02/22/14: Stable chronic findings  in the right hemithorax. No acute  cardiopulmonary process. Mild progression of small bowel distention with ascites and peritoneal nodularity on CT concerning for underlying peritoneal carcinomatosis.  Ultrasound guided paracentesis 02/22/14: Successful ultrasound-guided paracentesis yielding 4.1 L of ascites.  Ultrasound of the chest 02/22/14: Chronic multiloculated pleural effusions. Patient had prior Pleurx catheter to the right side. This was removed due to loculation of the effusion and no output from the catheter. The patient may have a trapped right lung and thoracentesis could result in a pneumothorax and therefore was not done.  US guided paracentesis 03/01/2014 with 2.1 L fluid removed  Peritoneal catheter placed 03/01/2014  Other consultants:  Physical therapy  Dietician Antibiotics:  None    Leisa Lenz, MD  [facility] Pager [phone]  If 7PM-7AM, please contact night-coverage [email or web address] Password [email or web address] 03/05/2014, 1:17 PM   LOS: 11 days    HPI/Subjective: No acute overnight events. This am rates pain 7/10.   Objective: Filed Vitals:   03/04/14 2136 03/05/14 0512 03/05/14 0726 03/05/14 1143  BP: 123/91 126/97    Pulse: 128 124    Temp: 97.9 F (36.6 C) 97.4 F (36.3 C)    TempSrc: Oral Oral    Resp: 16 16    Height:      Weight:   87.8 kg (193 lb 9 oz)   SpO2: 99% 100%  98%    Intake/Output Summary (Last 24 hours) at 03/05/14 1317 Last data filed at 03/04/14 1837  Gross per 24 hour  Intake    507 ml  Output    375 ml  Net    132 ml    Exam:   General:  Pt is alert, follows commands appropriately, not in acute distress  Cardiovascular: tachycardic, S1/S2 appreciated   Respiratory: Clear to auscultation bilaterally, no wheezing, no crackles, no rhonchi  Abdomen: distended, tender in mid abdomen, has peritoneal drain, bowel sounds present  Extremities: +2 LE pitting edema, pulses DP and PT palpable bilaterally  Neuro: Grossly nonfocal  Data Reviewed: Basic Metabolic  Panel:  Recent Labs Lab 02/28/14 0003 03/01/14 0450 03/05/14 0500  NA 140  --  144  K 4.4  --  4.4  CL 102  --  105  CO2 25  --  24  GLUCOSE 113*  --  94  BUN 18  --  25*  CREATININE 0.70 1.08 0.75  CALCIUM 9.0  --  8.7   Liver Function Tests: No results found for this basename: AST, ALT, ALKPHOS, BILITOT, PROT, ALBUMIN,  in the last 168 hours No results found for this basename: LIPASE, AMYLASE,  in the last 168 hours No results found for this basename: AMMONIA,  in the last 168 hours CBC:  Recent Labs Lab 02/28/14 0003 03/01/14 0450 03/05/14 0500  WBC 7.3 6.9 10.2  HGB 11.8* 11.8* 12.1*  HCT 40.7 39.7 42.5  MCV 83.2 84.5 84.2  PLT 203 197 157   Cardiac Enzymes: No results found for this basename: CKTOTAL, CKMB, CKMBINDEX, TROPONINI,  in the last 168 hours BNP: No components found with this basename: POCBNP,  CBG:  Recent Labs Lab 02/27/14 0727  GLUCAP 97    No results found for this or any previous visit (from the past 240 hour(s)).   Studies: No results found.  Scheduled Meds: . bisacodyl  10 mg Rectal Once  . budesonide-formoterol  1 puff Inhalation Daily  . enoxaparin (LOVENOX)   40 mg Subcutaneous QHS  . feeding supplement (ENSURE COMPLETE)  237 mL Oral  BID BM  . fentaNYL  25 mcg Transdermal Q72H  . hydrALAZINE  25 mg Oral 3 times per day  . lactose free nutrition  237 mL Oral BID  . lidocaine  2 patch Transdermal q morning - 10a  . magic mouthwash  10 mL Oral QID  . metoCLOPramide (REGLAN) injection  10 mg Intravenous 4 times per day  . multivitamin with minera  1 tablet Oral Daily  . pantoprazole  40 mg Oral Daily  . polyethylene glycol  17 g Oral Daily  . senna-docusate  1 tablet Oral Daily  . sucralfate  1 g Oral BID   Continuous Infusions: . sodium chloride 10 mL/hr at 03/04/14 1308

## 2014-03-05 NOTE — Progress Notes (Signed)
Patient assisted to bed from sitting up in recliner after physical therapy.  Noted that patients pants are soiled with urine, but patient adamantly refuses to have RN clean him up and change his clothing.  States does not have any other closes at bedside and his caregiver will bring more later today and he will get cleaned up than.  States doe snot want to take off his clothes and change into hospital gown or disposable scrubs.  Adamant that he will wait for the arrival of his caregiver.

## 2014-03-05 NOTE — Progress Notes (Signed)
PT Cancellation Note  Patient Details Name: Geoffrey Richardson MRN: [id number] DOB: 1961/06/16   Cancelled Treatment:    Reason Eval/Treat Not Completed: Pain limiting ability to participate. Pt stated he couldn't tolerate mobility due to pain. Will re-attempt after next dose of pain medicine.    [name] 03/05/2014, 11:42 AM [phone]

## 2014-03-05 NOTE — Progress Notes (Signed)
CARE MANAGEMENT NOTE 03/05/2014  Patient:  [facility]   Account Number:  [id number]  Date Initiated:  02/23/2014  Documentation initiated by:  [facility]  Subjective/Objective Assessment:   adm: Abdominal pain, shortness of breath     Action/Plan:   discharge planning: palliative vs HH   Anticipated DC Date:  03/08/2014   Anticipated DC Plan:  [place]  CM consult      PAC Choice  HOSPICE   Choice offered to / List presented to:  C-2 HC POA / Guardian        HH arranged  HH-1 RN      [place]   Status of service:  Completed, signed off Medicare Important Message given?  NO (If response is "NO", the following Medicare IM given date fields will be blank) Date Medicare IM given:   Medicare IM given by:   Date Additional Medicare IM given:   Additional Medicare IM given by:    Discharge Disposition:  [place]  Per UR Regulation:    If discussed at Long Length of Stay Meetings, dates discussed:    Comments:  03/06/2014 1500 NCM spoke to [facility], [phone]. Offered choice for Hospice. Agreeable to [place]. Requesting hospital bed, shower bench, and 3n1 for home. Will evaluate for oxygen also for home. Contacted [facility] with new referral. Faxed facesheet, and progress notes to [facility]. Jonnie Finner RN CCM Case Mgmt phone [phone]  03/05/2014 1000 NCM spoke to pt and gave permission to speak with caregiver, [name]. Jonnie Finner RN CCM Case Mgmt phone [phone]  03/02/14 CM met with pt who continues with his desire to return home with home health.  CM called [name] who states if decision is to go home with home health, [facility] is fine.  [facility] rep, Cyril Mourning is aware.  Will continue to monitor for [facility] orders.  Mariane Masters, BSN, [place] [phone].  02/23/14 16:20 CM met with pt and spoke with [name] on phone to verify address and Marsha's correct contact number which is  [phone].  Palliative to consult and CM will continue to follow for disposition.  Mariane Masters, BSN, CM [phone].

## 2014-03-05 NOTE — Progress Notes (Signed)
Drained Pleurx catheter of 1000 ml of straw colored drainage per order. Patient tolerated procedure well.

## 2014-03-05 NOTE — Progress Notes (Signed)
Patient Geoffrey Richardson      DOB: 1961/02/13      MLY:[id number]   Palliative Medicine Team at [facility] Progress Note    Subjective: Continues to have some pain. PRN dilaudid working well.  Still having nausea and heartburn.  Occasional hiccups but much better than before. Drinking some but appetite poor.  Wants to get back home. Extensive discussion as below.   1.5L removed from peritoneal drain yesterday.    Filed Vitals:   03/05/14 0512  BP: 126/97  Pulse: 124  Temp: 97.4 F (36.3 C)  Resp: 16   Physical exam: GEN: Alert, NAD  HEENT: [place], sclera anicteric, mmm  CV: Tachy, regular  LUNGS: CTAB, symm exp  ABD: less distended, RLQ peritoneal fluid drain. Mild diffuse tenderness  EXT: warm/dry  PSYCH: more engaged, appropriately tearful at times during conversation    Assessment and plan: 53 yo male with Stage IV NSCLC complicated by malignant pleural effusion and ascites s/p peritoneal fluid drain. Loculated plerual effusions not drainable   1. Code Status- Full Code.   - Was able to talk about this some more with Geoffrey Richardson today.  He talked about his Geoffrey Richardson at home around loved ones and how he wanted his death to look like this.  We talked about what a code event would mean in context of his disease (almost certainly being in his last hours to days) and how it would not help to achieve a death that looked like his Geoffrey Richardson.  He reported he understood this.  Tries not to think about this situation as it makes him feel down. He would like to talk about it again when Geoffrey Richardson can be here to discuss.  Spoke with Geoffrey Richardson who will be in Wed morning at [facility] aware that this will be topic of conversation and wants to support Geoffrey Richardson as best she can.    2. GOC- My colleagues Dr Geoffrey Richardson and Geoffrey Sill NP have been working closely with Geoffrey Richardson on this challenging issue. See my note from yesterday for details as well.  Code discussion as above.  Also discussed where to go  from here at length. Geoffrey Richardson has a strong desire to be at home for his final days.  Wants to go there from leaving hospital and avoid nursing home.  We talked about home hospice and the support they could achieve in keeping him at home.  He certainly is open to this and was trying to work with Geoffrey Richardson and family to make sure people could be with him as Geoffrey Richardson and her husband work long hours during day.  We also talked about exploring possible residential hospice if that could not be achieved as opposed to nursing home and he agrees that might be a good second option or backup plan if things were not going well at home.  Spoke with Geoffrey Richardson over phone who wants to achieve his wish of going home.  She lives with her husband who works as well, but Geoffrey Richardson will be reducing her shifts to 6h and has a son/daughter at home who can help out.  I have contacted case management to start hospice referral process so things can be arranged when ready for d/c.    3. Symptom Management  A. Pain- cancer related, possible reflux and poor motility playing role as well. Tolerating fentanyl patch 33mcg/hr well. Used 3.5mg  IV dilaudid in past 24 hours. Not using oxycodone. On schedule reglan and PPI as well as PRN  GI Cocktail.  On bowel regimen and reports BMx2 this week. Ascites fluid drainage PRN for comfort   - Will change carafate to scheduled - Increase reglan to q6h to treat nausea/hicccups.   B. Anxiety- Alprazolam TID PRN. Continued emotional/spiritual support   4. Psychosocial/spiritual- feels lonely here and enjoys company. Strong spiritual faith.  Chaplain offered. Close with family friend Geoffrey Richardson who is caregiver at home.  Close with his sister in [place] and daughter in [place], [place].  Time In: 830  Time Out: 930 Total Time: 60 Minutes   >50% of time spent in counselling and coordination of care.   Doran Clay D.O.  Palliative Medicine Team at [facility]  Pager: [phone]  Team Phone: [phone]

## 2014-03-05 NOTE — Evaluation (Addendum)
Physical Therapy Evaluation Patient Details Name: Geoffrey Richardson MRN: [id number] DOB: 04-11-1961 Today's Date: 03/05/2014   History of Present Illness  Geoffrey Richardson is an 53 y.o. male with a PMH of non-small cell lung cancer with recurrent malignant pleural effusion admitted with abdominal pain, ascites.   Clinical Impression  **Pt admitted with *cancer associated pain, ascites**. Pt currently with functional limitations due to the deficits listed below (see PT Problem List).  Pt will benefit from skilled PT to increase their independence and safety with mobility to allow discharge to the venue listed below.    Supervision for bed to recliner transfer with RW. Pt refused trial of ambulation. Pt may be safe to transfer to/from [facility] if he discharges home. THere is concern though about his ability to manage medication and self care as he was saturated in urine at start of PT eval today. He does not have 24* assist available at home, therefore SNF recommended, though pt strongly desires to DC home.  *    Follow Up Recommendations SNF;Other (comment) (supervision for bed to chair transfer, assist for ADLs (pt was saturated in urine at start of PT Eval, declined assist cleaning up, stated he had someone coming in to help with that).)    Equipment Recommendations  None recommended by PT    Recommendations for Other Services       Precautions / Restrictions Precautions Precautions: Fall Restrictions Weight Bearing Restrictions: No      Mobility  Bed Mobility Overal bed mobility: Needs Assistance Bed Mobility: Supine to Sit     Supine to sit: HOB elevated;Min assist     General bed mobility comments: min A for LLE  Transfers Overall transfer level: Needs assistance Equipment used: Rolling walker (2 wheeled) Transfers: Stand Pivot Transfers;Sit to/from Stand Sit to Stand: Supervision Stand pivot transfers: Supervision       General transfer comment: used RW to pivot from bed to  recliner  Ambulation/Gait             General Gait Details: pt refused  Stairs            Wheelchair Mobility    Modified Rankin (Stroke Patients Only)       Balance Overall balance assessment: Needs assistance   Sitting balance-Leahy Scale: Good       Standing balance-Leahy Scale: Poor Standing balance comment: requires UE support in standing                             Pertinent Vitals/Pain **7/10 abdomen premedicated*    Home Living Family/patient expects to be discharged to:: Private residence Living Arrangements: Spouse/significant other;Children Available Help at Discharge: Family;Available PRN/intermittently Type of Home: House       Home Layout: One level Home Equipment: [place] - 2 wheels;Wheelchair - manual      Prior Function Level of Independence: Independent with assistive device(s)         Comments: pt states he was independent with ADLs and used RW or WC for mobility     Hand Dominance        Extremity/Trunk Assessment   Upper Extremity Assessment: Overall WFL for tasks assessed           Lower Extremity Assessment: Generalized weakness (LLE edematous, L knee ext -2/5, R knee ext +3/5)      Cervical / Trunk Assessment: Normal  Communication   Communication: No difficulties  Cognition Arousal/Alertness: Awake/alert Behavior During Therapy: [facility]  for tasks assessed/performed Overall Cognitive Status: Within Functional Limits for tasks assessed                      General Comments      Exercises        Assessment/Plan    PT Assessment Patient needs continued PT services  PT Diagnosis Difficulty walking;Generalized weakness;Acute pain   PT Problem List Decreased strength;Decreased activity tolerance;Decreased balance;Pain;Decreased mobility  PT Treatment Interventions DME instruction;Gait training;Functional mobility training;Therapeutic activities;Patient/family education;Balance  training;Therapeutic exercise   PT Goals (Current goals can be found in the Care Plan section) Acute Rehab PT Goals Patient Stated Goal: to go home PT Goal Formulation: With patient Time For Goal Achievement: 03/19/14 Potential to Achieve Goals: Fair    Frequency Min 3X/week   Barriers to discharge Decreased caregiver support      Co-evaluation               End of Session   Activity Tolerance: Patient limited by pain Patient left: in chair;with call bell/phone within reach Nurse Communication: Mobility status         Time: 0630-1601 PT Time Calculation (min): 14 min   Charges:   PT Evaluation $Initial PT Evaluation Tier I: 1 Procedure PT Treatments $Therapeutic Activity: 8-22 mins   PT G Codes:          Philomena Doheny 03/05/2014, 1:25 PM [phone]

## 2014-03-06 ENCOUNTER — Inpatient Hospital Stay (HOSPITAL_COMMUNITY): Payer: Medicaid Other

## 2014-03-06 LAB — CBC
HCT: 46.3 % (ref 39.0–52.0)
Hemoglobin: 13 g/dL (ref 13.0–17.0)
MCH: 24 pg — AB (ref 26.0–34.0)
MCHC: 28.1 g/dL — AB (ref 30.0–36.0)
MCV: 85.6 fL (ref 78.0–100.0)
PLATELETS: 153 10*3/uL (ref 150–400)
RBC: 5.41 MIL/uL (ref 4.22–5.81)
RDW: 18.7 % — ABNORMAL HIGH (ref 11.5–15.5)
WBC: 8.8 10*3/uL (ref 4.0–10.5)

## 2014-03-06 LAB — BASIC METABOLIC PANEL
ANION GAP: 16 — AB (ref 5–15)
BUN: 31 mg/dL — ABNORMAL HIGH (ref 6–23)
CALCIUM: 8.8 mg/dL (ref 8.4–10.5)
CO2: 25 mEq/L (ref 19–32)
CREATININE: 0.93 mg/dL (ref 0.50–1.35)
Chloride: 102 mEq/L (ref 96–112)
GFR calc Af Amer: >90 mL/min (ref >90)
Glucose, Bld: 101 mg/dL — ABNORMAL HIGH (ref 70–99)
Potassium: 4.9 mEq/L (ref 3.7–5.3)
Sodium: 143 mEq/L (ref 137–147)

## 2014-03-06 MED ORDER — HYDROMORPHONE HCL PF 1 MG/ML IJ SOLN
INTRAMUSCULAR | Status: AC
  Filled 2014-03-06: qty 1

## 2014-03-06 MED ORDER — LORAZEPAM 2 MG/ML IJ SOLN
0.5000 mg | Freq: Four times a day (QID) | INTRAMUSCULAR | Status: DC | PRN
  Administered 2014-03-06 – 2014-03-07 (×2): 0.5 mg via INTRAVENOUS
  Filled 2014-03-06 (×2): qty 1

## 2014-03-06 MED ORDER — HYDROMORPHONE HCL PF 1 MG/ML IJ SOLN
0.5000 mg | INTRAMUSCULAR | Status: DC | PRN
  Administered 2014-03-06 – 2014-03-08 (×11): 0.5 mg via INTRAVENOUS
  Filled 2014-03-06 (×10): qty 1

## 2014-03-06 NOTE — Progress Notes (Signed)
Progress Note   Geoffrey Richardson JYN:[id number] DOB: 05-02-1961 DOA: 02/22/2014 PCP: Philis Fendt, MD   Brief Narrative:   Geoffrey Richardson is an 53 y.o. male a PMH of non-small cell lung cancer with recurrent malignant pleural effusion status post pleural catheter insertion 01/15/2013 (subsequently removed after became infected) and chemotherapy with carboplatin and Taxotere on 02/17/13 status post 6 cycles which completed on 06/02/13, recent hospital admission 01/25/14-02/03/14 for evaluation of abdominal pain and early satiety. CT scan of the chest, abdomen and pelvis done during that admission showed peritoneal caking and ascites. He had a 1.4 L paracentesis done 01/26/14. Plan was to followup with oncology as an outpatient for consideration of further chemotherapy if his performance status/nutrition improved, however he has failed to thrive and was readmitted 02/22/14 with uncontrolled cancer related pain from malignant ascites and shortness of breath from a malignant pleural effusion. Palliative care has been consulted, however the patient continues to want aggressive care despite his deteriorating status and poor prognosis.  Assessment/Plan:   Principal Problem:  Cancer associated pain secondary to malignant ascites and peritoneal implants   Palliative care consulted to recommend a more effective outpatient pain management strategy.   Status post placement of a peritoneal catheter for recurrent rapidly accumulating malignant ascites.  Palliative care adjusting pain medications to achieve good pain control.  Patient now a DO NOT RESUSCITATE status.  Continue every other day drainage of ascites through peritoneal catheter.  Active Problems:  Functional quadriplegia   Continues to be weak.  Oral thrush  Treated with Diflucan and Magic mouthwash.  Benign essential hypertension   Continue hydralazine and labetalol.  COPD (chronic obstructive pulmonary disease)/chronic respiratory  failure with hypoxia / dyspnea   Continue Symbicort. Continue bronchodilators as needed.   Provide supplemental oxygen to keep saturations greater than 92%.  Severe malnutrition   Pt meets criteria for severe MALNUTRITION in the context of chronic illness as evidenced by PO intake < 75% for > one month, 6.8% body weight loss in one month.  Secondary to cancer related cachexia. Boost nutritional supplements ordered.  Dietitian following.  Malignant pleural effusion   Effusion is loculated and cannot be drained per interventional radiology.  Metastatic Lung cancer   Seen by Dr. [name] 02/22/14. Poor prognosis with no recommendations for chemotherapy.  DVT prophylaxis   Lovenox ordered.  Code Status: DNR Family Communication: [name] (close friend): [phone]. No family at bedside today. Disposition Plan: Home verses residential hospice when stable.  IV Access:    Peripheral IV   Procedures and diagnostic studies:    Acute abdominal series 02/22/14: Stable chronic findings in the right hemithorax. No acute cardiopulmonary process. Mild progression of small bowel distention with ascites and peritoneal nodularity on CT concerning for underlying peritoneal carcinomatosis.  Ultrasound guided paracentesis 02/22/14: Successful ultrasound-guided paracentesis yielding 4.1 L of ascites.  Ultrasound of the chest 02/22/14: Chronic multiloculated pleural effusions. Patient had prior Pleurx catheter to the right side. This was removed due to loculation of the effusion and no output from the catheter. The patient may have a trapped right lung and thoracentesis could result in a pneumothorax and therefore was not done.  Chest x-ray 02/26/14: No acute findings. Moderate to large right pleural effusion. Stable appearance from the prior chest radiograph. COPD.  Ultrasound-guided paracentesis 02/28/14: Successful ultrasound-guided paracentesis yielding 2.5 L of ascites.  Insertion of  tunneled peritoneal drainage catheter 03/01/14:2.1 L of ascites removed after catheter placement.   Medical Consultants:    Dr.  Zola Button, Oncology.  Dr. Billey Chang, Palliative Care   Other Consultants:    Physical therapy  Dietitian   Anti-Infectives:    Diflucan 02/27/14 ---> 03/05/14    Subjective:   Geoffrey Richardson continues to have intermittent abdominal and back pain, relieved with IV pain medications. Appetite remains poor. His shortness of breath with activity. States his bowels moved the day before yesterday. Complains of hiccups.  Objective:    Filed Vitals:   03/05/14 2027 03/05/14 2048 03/06/14 0502 03/06/14 1406  BP:  124/100 123/87 125/96  Pulse:  128 126 129  Temp:  97.5 F (36.4 C) 97.9 F (36.6 C) 97 F (36.1 C)  TempSrc:  Oral Axillary Axillary  Resp:  24 20 20   Height:      Weight:      SpO2: 97% 99% 100% 100%    Intake/Output Summary (Last 24 hours) at 03/06/14 1700 Last data filed at 03/06/14 1406  Gross per 24 hour  Intake    600 ml  Output      0 ml  Net    600 ml    Exam: Gen:  NAD, cachectic, weak Cardiovascular:  Tachycardic, No M/R/G Respiratory:  Lungs diminished in the bases Gastrointestinal:  Abdomen softly distended, positive bowel sounds  Extremities:  2+ edema  Data Reviewed:    Labs: Basic Metabolic Panel:  Recent Labs Lab 02/28/14 0003 03/01/14 0450 03/05/14 0500 03/06/14 0416  NA 140  --  144 143  K 4.4  --  4.4 4.9  CL 102  --  105 102  CO2 25  --  24 25  GLUCOSE 113*  --  94 101*  BUN 18  --  25* 31*  CREATININE 0.70 1.08 0.75 0.93  CALCIUM 9.0  --  8.7 8.8   GFR Estimated Creatinine Clearance: 102 ml/min (by C-G formula based on Cr of 0.93). CBC:  Recent Labs Lab 02/28/14 0003 03/01/14 0450 03/05/14 0500 03/06/14 0416  WBC 7.3 6.9 10.2 8.8  HGB 11.8* 11.8* 12.1* 13.0  HCT 40.7 39.7 42.5 46.3  MCV 83.2 84.5 84.2 85.6  PLT 203 197 157 153   Sepsis Labs:  Recent Labs Lab  02/28/14 0003 03/01/14 0450 03/05/14 0500 03/06/14 0416  WBC 7.3 6.9 10.2 8.8   Microbiology No results found for this or any previous visit (from the past 240 hour(s)).   Medications:   . bisacodyl  10 mg Rectal Once  . budesonide-formoterol  1 puff Inhalation Daily  . enoxaparin (LOVENOX) injection  40 mg Subcutaneous QHS  . feeding supplement (ENSURE COMPLETE)  237 mL Oral BID BM  . fentaNYL  25 mcg Transdermal Q72H  . hydrALAZINE  25 mg Oral 3 times per day  . HYDROmorphone      . lactose free nutrition  237 mL Oral BID  . lidocaine  2 patch Transdermal q morning - 10a  . magic mouthwash  10 mL Oral QID  . metoCLOPramide (REGLAN) injection  10 mg Intravenous 4 times per day  . multivitamin with minerals  1 tablet Oral Daily  . pantoprazole  40 mg Oral Daily  . polyethylene glycol  17 g Oral Daily  . senna-docusate  1 tablet Oral Daily  . sucralfate  1 g Oral BID   Continuous Infusions: . sodium chloride 10 mL/hr at 03/04/14 1308    Time spent: 25 minutes.    LOS: 12 days   [facility] Pager [phone]. If unable to reach me by  pager, please call my cell phone at [phone].  *Please refer to [email or web address], password [email or web address] to get updated schedule on who will round on this patient, as hospitalists switch teams weekly. If 7PM-7AM, please contact night-coverage at [email or web address], password [email or web address] for any overnight needs.  03/06/2014, 5:00 PM    **Disclaimer: This note was dictated with voice recognition software. Similar sounding words can inadvertently be transcribed and this note may contain transcription errors which may not have been corrected upon publication of note.**

## 2014-03-06 NOTE — Progress Notes (Signed)
Drained 1300 cc of clear amber fluid from pleurex.  Patient tolerated procedure well, will continue to monitor.

## 2014-03-06 NOTE — Progress Notes (Signed)
Progress Note from the Palliative Medicine Team at [place]:  Mr. Sedita has just vomited what appears to be fecal matter. He says that he had a burning sensation in his chest and then became nauseous and vomited. We are awaiting abdominal xray to further assess for obstruction. He is currently refusing NGT even they we discussed the importance of considering this for his comfort. He is currently refusing dulcolax suppository and will only take this if [name] is there to administer - he is a very private person and she has assumed the role of his caregiver. I have increased his dilaudid from every 3 hours to 2 hours. I have also changed his po Xanax to IV Ativan for anxiety. Mr. Sprong also tells me that he is tired and does not wish to discuss goals of care or decisions and wishes to leave these decisions up to [facility].  I updated [name] via telephone at request of Mr. Russett. We discussed looking for obstruction and possible need for NGT. She understands. She tells me that they have had good talks this weekend and he understands and is even more accepting. She says that he told her that he was "tired of talking about it" and wants her to make decisions regarding code status. [name] says that she has spoken with [name] and they agree with DNR and wish for him to be comfortable and they do not want to see him suffer. I will continue to follow and support.    Objective: Allergies  Allergen Reactions  . Morphine And Related Nausea And Vomiting and Other (See Comments)    dizziness   Scheduled Meds: . bisacodyl  10 mg Rectal Once  . budesonide-formoterol  1 puff Inhalation Daily  . enoxaparin (LOVENOX) injection  40 mg Subcutaneous QHS  . feeding supplement (ENSURE COMPLETE)  237 mL Oral BID BM  . fentaNYL  25 mcg Transdermal Q72H  . hydrALAZINE  25 mg Oral 3 times per day  . lactose free nutrition  237 mL Oral BID  . lidocaine  2 patch Transdermal q morning - 10a  . magic mouthwash   10 mL Oral QID  . metoCLOPramide (REGLAN) injection  10 mg Intravenous 4 times per day  . multivitamin with minerals  1 tablet Oral Daily  . pantoprazole  40 mg Oral Daily  . polyethylene glycol  17 g Oral Daily  . senna-docusate  1 tablet Oral Daily  . sucralfate  1 g Oral BID   Continuous Infusions: . sodium chloride 10 mL/hr at 03/04/14 1308   PRN Meds:.acetaminophen, albuterol, ALPRAZolam, bisacodyl, chlorproMAZINE (THORAZINE) IV, chlorproMAZINE, gi cocktail, HYDROmorphone (DILAUDID) injection, ondansetron (ZOFRAN) IV, oxyCODONE, promethazine, ranitidine  BP 125/96  Pulse 129  Temp(Src) 97 F (36.1 C) (Axillary)  Resp 20  Ht 6' (1.829 m)  Wt 87.8 kg (193 lb 9 oz)  BMI 26.25 kg/m2  SpO2 100%   PPS: 20%     Intake/Output Summary (Last 24 hours) at 03/06/14 1509 Last data filed at 03/06/14 1406  Gross per 24 hour  Intake    600 ml  Output      0 ml  Net    600 ml      LBM: 03/03/14      Physical Exam:  General: NAD, cachectic, ill appearing HEENT: Severe temporal muscle wasting, no JVD, oral mucosa moist, thrush improved  Chest: No labored breathing, symmetric  CVS: Tachycardic  Abdomen: Distended, tender to palpation, +BS  Ext: MAE, 2+ BLE edema,  warm to touch  Neuro: Awake, alert, oriented x 3, follows commands    Labs: CBC    Component Value Date/Time   WBC 8.8 03/06/2014 0416   WBC 6.1 12/13/2013 0803   RBC 5.41 03/06/2014 0416   RBC 4.53 12/13/2013 0803   HGB 13.0 03/06/2014 0416   HGB 11.3* 12/13/2013 0803   HCT 46.3 03/06/2014 0416   HCT 37.0* 12/13/2013 0803   PLT 153 03/06/2014 0416   PLT 267 12/13/2013 0803   MCV 85.6 03/06/2014 0416   MCV 81.8 12/13/2013 0803   MCH 24.0* 03/06/2014 0416   MCH 24.9* 12/13/2013 0803   MCHC 28.1* 03/06/2014 0416   MCHC 30.5* 12/13/2013 0803   RDW 18.7* 03/06/2014 0416   RDW 18.5* 12/13/2013 0803   LYMPHSABS 0.9 02/22/2014 0922   LYMPHSABS 1.5 12/13/2013 0803   MONOABS 0.6 02/22/2014 0922   MONOABS 0.4 12/13/2013 0803   EOSABS 0.0  02/22/2014 0922   EOSABS 0.0 12/13/2013 0803   BASOSABS 0.0 02/22/2014 0922   BASOSABS 0.0 12/13/2013 0803    BMET    Component Value Date/Time   NA 143 03/06/2014 0416   NA 144 12/13/2013 0803   K 4.9 03/06/2014 0416   K 4.1 12/13/2013 0803   CL 102 03/06/2014 0416   CL 103 01/19/2013 1008   CO2 25 03/06/2014 0416   CO2 26 12/13/2013 0803   GLUCOSE 101* 03/06/2014 0416   GLUCOSE 109 12/13/2013 0803   GLUCOSE 129* 01/19/2013 1008   BUN 31* 03/06/2014 0416   BUN 16.1 12/13/2013 0803   CREATININE 0.93 03/06/2014 0416   CREATININE 0.8 12/13/2013 0803   CALCIUM 8.8 03/06/2014 0416   CALCIUM 9.5 12/13/2013 0803   GFRNONAA >90 03/06/2014 0416   GFRAA >90 03/06/2014 0416    CMP     Component Value Date/Time   NA 143 03/06/2014 0416   NA 144 12/13/2013 0803   K 4.9 03/06/2014 0416   K 4.1 12/13/2013 0803   CL 102 03/06/2014 0416   CL 103 01/19/2013 1008   CO2 25 03/06/2014 0416   CO2 26 12/13/2013 0803   GLUCOSE 101* 03/06/2014 0416   GLUCOSE 109 12/13/2013 0803   GLUCOSE 129* 01/19/2013 1008   BUN 31* 03/06/2014 0416   BUN 16.1 12/13/2013 0803   CREATININE 0.93 03/06/2014 0416   CREATININE 0.8 12/13/2013 0803   CALCIUM 8.8 03/06/2014 0416   CALCIUM 9.5 12/13/2013 0803   PROT 7.8 02/22/2014 0922   PROT 8.3 12/13/2013 0803   ALBUMIN 2.6* 02/22/2014 0922   ALBUMIN 3.1* 12/13/2013 0803   AST 17 02/22/2014 0922   AST 10 12/13/2013 0803   ALT 12 02/22/2014 0922   ALT <6 12/13/2013 0803   ALKPHOS 125* 02/22/2014 0922   ALKPHOS 94 12/13/2013 0803   BILITOT 0.4 02/22/2014 0922   BILITOT 0.21 12/13/2013 0803   GFRNONAA >90 03/06/2014 0416   GFRAA >90 03/06/2014 0416      Assessment and Plan: 1. Code Status: DNR 2. Symptom Control: 1. Pain: Fentanyl patch 25 mcg. Dilaudid 0.5 mg increased to every 2 hours prn. 2. Anxiety: Ativan IV 0.5 every 6 hours prn. 3. Bowel Regimen: Senokot-S daily. Dulcolax supp daily prn - encouraged to utilize.  4. Nausea: Ondansetron prn. Promethazine prn. Consider Haldol if not relieved. 5. If obstruction:  Consider Octreotide 50 mcg SQ injection every 12 hours. Consider decadron. Encourage NGT.  3. Psycho/Social: Emotional support provided to patient. 4. Disposition: Home with hospice.    Time In  Time Out Total Time Spent with Patient Total Overall Time  1535 1600 71min 24min    Greater than 50%  of this time was spent counseling and coordinating care related to the above assessment and plan.  Vinie Sill, NP Palliative Medicine Team Pager # [phone] (M-F 8a-5p) Team Phone # [phone] (Nights/Weekends)

## 2014-03-06 NOTE — Progress Notes (Signed)
Telephone call from [facility] to follow up on week-end referral to Hospice and [facility]). [facility] Liaison aware of referral- per chart review and discussion with staff RN Beverlee Nims and Edwin Cap PMT NP- pt currently having issues with pain control and vomiting fecal matter; awaiting tests and discussion of issues/options with patient and friend later today.  [facility] Liaison will plan to follow up tomorrow with primary team and attempt to speak with patient and/or contact pt's HCPOA/friend [name] and will continue to follow for discharge needs if still appropriate.  Danton Sewer, [facility] Liaison [phone]

## 2014-03-06 NOTE — Progress Notes (Signed)
Pt had several i.v. Site attempts during the day. IV team came to try restart but pt refused restart.Site was assessed and per i.v team ok to use. Will change dressing and continue to monitor site closely.

## 2014-03-07 ENCOUNTER — Telehealth: Payer: Self-pay | Admitting: Medical Oncology

## 2014-03-07 DIAGNOSIS — R110 Nausea: Secondary | ICD-10-CM

## 2014-03-07 LAB — BASIC METABOLIC PANEL
ANION GAP: 15 (ref 5–15)
BUN: 45 mg/dL — ABNORMAL HIGH (ref 6–23)
CHLORIDE: 102 meq/L (ref 96–112)
CO2: 25 mEq/L (ref 19–32)
Calcium: 9 mg/dL (ref 8.4–10.5)
Creatinine, Ser: 1.35 mg/dL (ref 0.50–1.35)
GFR calc non Af Amer: 59 mL/min — ABNORMAL LOW (ref >90)
GFR, EST AFRICAN AMERICAN: 68 mL/min — AB (ref >90)
Glucose, Bld: 111 mg/dL — ABNORMAL HIGH (ref 70–99)
Potassium: 5.3 mEq/L (ref 3.7–5.3)
Sodium: 142 mEq/L (ref 137–147)

## 2014-03-07 LAB — CBC
HCT: 44.7 % (ref 39.0–52.0)
Hemoglobin: 13 g/dL (ref 13.0–17.0)
MCH: 24.3 pg — ABNORMAL LOW (ref 26.0–34.0)
MCHC: 29.1 g/dL — AB (ref 30.0–36.0)
MCV: 83.4 fL (ref 78.0–100.0)
PLATELETS: 145 10*3/uL — AB (ref 150–400)
RBC: 5.36 MIL/uL (ref 4.22–5.81)
RDW: 18.7 % — ABNORMAL HIGH (ref 11.5–15.5)
WBC: 9.6 10*3/uL (ref 4.0–10.5)

## 2014-03-07 MED ORDER — DEXAMETHASONE SODIUM PHOSPHATE 4 MG/ML IJ SOLN: 4.0000 mg | INTRAMUSCULAR | Status: DC

## 2014-03-07 MED ORDER — OXYCODONE HCL 20 MG/ML PO CONC
10.0000 mg | ORAL | Status: DC | PRN
  Administered 2014-03-07 (×3): 10 mg via ORAL
  Filled 2014-03-07 (×3): qty 1

## 2014-03-07 MED ORDER — DEXAMETHASONE SODIUM PHOSPHATE 4 MG/ML IJ SOLN
4.0000 mg | Freq: Three times a day (TID) | INTRAMUSCULAR | Status: DC
  Administered 2014-03-07 – 2014-03-08 (×3): 4 mg via INTRAVENOUS
  Filled 2014-03-07 (×7): qty 1

## 2014-03-07 MED ORDER — OCTREOTIDE ACETATE 50 MCG/ML IJ SOLN
50.0000 ug | Freq: Three times a day (TID) | INTRAMUSCULAR | Status: DC
  Administered 2014-03-07 – 2014-03-08 (×4): 50 ug via SUBCUTANEOUS
  Filled 2014-03-07 (×7): qty 1

## 2014-03-07 MED ORDER — OCTREOTIDE ACETATE 50 MCG/ML IJ SOLN
50.0000 ug | Freq: Two times a day (BID) | INTRAMUSCULAR | Status: DC
Start: 2014-03-07 — End: 2014-03-07

## 2014-03-07 NOTE — Telephone Encounter (Signed)
Yvette with [facility] and [facility] called to inform office that patient is being d/c from hospital tomorrow and will be going home on hospice. Confirming that Dr. [name] will be attending and hospice to symptom management.  Confirmed with Yvette above.  MD informed.

## 2014-03-07 NOTE — Progress Notes (Signed)
Progress Note   Geoffrey Richardson HKV:[id number] DOB: [birth year] DOA: 02/22/2014 PCP: Philis Fendt, MD   Brief Narrative:   Geoffrey Richardson is an 53 y.o. male a PMH of non-small cell lung cancer with recurrent malignant pleural effusion status post pleural catheter insertion 01/15/2013 (subsequently removed after became infected) and chemotherapy with carboplatin and Taxotere on 02/17/13 status post 6 cycles which completed on 06/02/13, recent hospital admission 01/25/14-02/03/14 for evaluation of abdominal pain and early satiety. CT scan of the chest, abdomen and pelvis done during that admission showed peritoneal caking and ascites. He had a 1.4 L paracentesis done 01/26/14. Plan was to followup with oncology as an outpatient for consideration of further chemotherapy if his performance status/nutrition improved, however he has failed to thrive and was readmitted 02/22/14 with uncontrolled cancer related pain from malignant ascites and shortness of breath from a malignant pleural effusion. Palliative care has been consulted, however the patient continues to want aggressive care despite his deteriorating status and poor prognosis.  Assessment/Plan:   Principal Problem:  Cancer associated pain secondary to malignant ascites and peritoneal implants   Palliative care consulted to recommend a more effective outpatient pain management strategy.   Status post placement of a peritoneal catheter for recurrent rapidly accumulating malignant ascites.  Palliative care adjusting pain medications to achieve good pain control.  Patient now a DO NOT RESUSCITATE status with plans to discharge home with hospice 03/08/14.  Continue every other day drainage of ascites through peritoneal catheter.  Active Problems:  Functional quadriplegia   Continues to be weak.  Oral thrush  Treated with Diflucan and Magic mouthwash.  Benign essential hypertension   Continue hydralazine and labetalol.  COPD (chronic  obstructive pulmonary disease)/chronic respiratory failure with hypoxia / dyspnea   Continue Symbicort. Continue bronchodilators as needed.   Provide supplemental oxygen to keep saturations greater than 92%.  Severe malnutrition   Pt meets criteria for severe MALNUTRITION in the context of chronic illness as evidenced by PO intake < 75% for > one month, 6.8% body weight loss in one month.  Secondary to cancer related cachexia. Boost nutritional supplements ordered.  Dietitian following.  Malignant pleural effusion   Effusion is loculated and cannot be drained per interventional radiology.  Metastatic Lung cancer   Seen by Dr. [name] 02/22/14. Poor prognosis with no recommendations for chemotherapy.  DVT prophylaxis   Lovenox ordered.  Code Status: DNR Family Communication: [name] (close friend): [phone]. No family at bedside today. Disposition Plan: Home verses residential hospice when stable.  IV Access:    Peripheral IV   Procedures and diagnostic studies:    Acute abdominal series 02/22/14: Stable chronic findings in the right hemithorax. No acute cardiopulmonary process. Mild progression of small bowel distention with ascites and peritoneal nodularity on CT concerning for underlying peritoneal carcinomatosis.  Ultrasound guided paracentesis 02/22/14: Successful ultrasound-guided paracentesis yielding 4.1 L of ascites.  Ultrasound of the chest 02/22/14: Chronic multiloculated pleural effusions. Patient had prior Pleurx catheter to the right side. This was removed due to loculation of the effusion and no output from the catheter. The patient may have a trapped right lung and thoracentesis could result in a pneumothorax and therefore was not done.  Chest x-ray 02/26/14: No acute findings. Moderate to large right pleural effusion. Stable appearance from the prior chest radiograph. COPD.  Ultrasound-guided paracentesis 02/28/14: Successful ultrasound-guided  paracentesis yielding 2.5 L of ascites.  Insertion of tunneled peritoneal drainage catheter 03/01/14:2.1 L of ascites removed after catheter placement.  Medical Consultants:    Dr. Zola Button, Oncology.  Dr. Billey Chang, Palliative Care   Other Consultants:    Physical therapy  Dietitian   Anti-Infectives:    Diflucan 02/27/14 ---> 03/05/14    Subjective:   Geoffrey Richardson is sitting up in the chair today, reporting some pain but no current complaints of dyspnea or nausea. His bowels have moved in the last 24 hours.  Objective:    Filed Vitals:   03/06/14 2119 03/07/14 0533 03/07/14 0540 03/07/14 0815  BP: 105/83 110/83 124/57   Pulse: 135 128 85   Temp: 97.4 F (36.3 C) 98.1 F (36.7 C) 97.8 F (36.6 C)   TempSrc: Oral Oral Oral   Resp: 20 22 20    Height:      Weight:      SpO2: 98% 98% 95% 95%    Intake/Output Summary (Last 24 hours) at 03/07/14 1305 Last data filed at 03/07/14 0857  Gross per 24 hour  Intake   1200 ml  Output      0 ml  Net   1200 ml    Exam: Gen:  NAD, cachectic, weak Cardiovascular:  Tachycardic, No M/R/G Respiratory:  Lungs diminished in the bases Gastrointestinal:  Abdomen softly distended, positive bowel sounds  Extremities:  3+ edema BLE  Data Reviewed:    Labs: Basic Metabolic Panel:  Recent Labs Lab 03/01/14 0450  03/05/14 0500 03/06/14 0416 03/07/14 0450  NA  --   --  144 143 142  K  --   < > 4.4 4.9 5.3  CL  --   --  105 102 102  CO2  --   --  24 25 25   GLUCOSE  --   --  94 101* 111*  BUN  --   --  25* 31* 45*  CREATININE 1.08  --  0.75 0.93 1.35  CALCIUM  --   --  8.7 8.8 9.0  < > = values in this interval not displayed. GFR Estimated Creatinine Clearance: 70.3 ml/min (by C-G formula based on Cr of 1.35). CBC:  Recent Labs Lab 03/01/14 0450 03/05/14 0500 03/06/14 0416 03/07/14 0450  WBC 6.9 10.2 8.8 9.6  HGB 11.8* 12.1* 13.0 13.0  HCT 39.7 42.5 46.3 44.7  MCV 84.5 84.2 85.6 83.4  PLT  197 157 153 145*   Sepsis Labs:  Recent Labs Lab 03/01/14 0450 03/05/14 0500 03/06/14 0416 03/07/14 0450  WBC 6.9 10.2 8.8 9.6   Microbiology No results found for this or any previous visit (from the past 240 hour(s)).   Medications:   . bisacodyl  10 mg Rectal Once  . budesonide-formoterol  1 puff Inhalation Daily  . dexamethasone  4 mg Intravenous 3 times per day  . enoxaparin (LOVENOX) injection  40 mg Subcutaneous QHS  . feeding supplement (ENSURE COMPLETE)  237 mL Oral BID BM  . fentaNYL  25 mcg Transdermal Q72H  . hydrALAZINE  25 mg Oral 3 times per day  . lactose free nutrition  237 mL Oral BID  . lidocaine  2 patch Transdermal q morning - 10a  . magic mouthwash  10 mL Oral QID  . metoCLOPramide (REGLAN) injection  10 mg Intravenous 4 times per day  . multivitamin with minerals  1 tablet Oral Daily  . octreotide  50 mcg Subcutaneous 3 times per day  . pantoprazole  40 mg Oral Daily  . polyethylene glycol  17 g Oral Daily  . senna-docusate  1 tablet Oral Daily  .  sucralfate  1 g Oral BID   Continuous Infusions: . sodium chloride 10 mL/hr at 03/06/14 2246    Time spent: 25 minutes.    LOS: 13 days   [facility] Pager [phone]. If unable to reach me by pager, please call my cell phone at [phone].  *Please refer to [email or web address], password [email or web address] to get updated schedule on who will round on this patient, as hospitalists switch teams weekly. If 7PM-7AM, please contact night-coverage at [email or web address], password [email or web address] for any overnight needs.  03/07/2014, 1:05 PM    **Disclaimer: This note was dictated with voice recognition software. Similar sounding words can inadvertently be transcribed and this note may contain transcription errors which may not have been corrected upon publication of note.**

## 2014-03-07 NOTE — Progress Notes (Signed)
CSW was re-consulted for potential SNF versus Residential Hospice.  CSW reviewed chart and spoke with RNCM. RNCM spoke with pt significant other, [name] who confirmed plans for pt to return home with [facility] to follow.   Per RNCM, pt will need ambulance transport home.  CSW to continue to follow to assist with ambulance transport upon discharge.  [name], MSW, [place] [facility] [phone]

## 2014-03-07 NOTE — Progress Notes (Signed)
Physical Therapy Treatment Patient Details Name: Geoffrey Richardson MRN: [id number] DOB: 1961-06-07 Today's Date: 03/07/2014    History of Present Illness Geoffrey Richardson is an 53 y.o. male with a PMH of non-small cell lung cancer with recurrent malignant pleural effusion admitted with abdominal pain, ascites.     PT Comments    Pt in bed on 3 lts O2 c/o max weakness.  Sat pt EOB only able to tolerate a brief time and unable to sit fully upright.  Assisted from elevated bed to recliner using "bear hug" stand pivot 1/4 turn as pt c/o max weakness and inability to walk.  Positioned in recliner.   Follow Up Recommendations  Home health PT (pt declines SNF)  Plans to D/C to home with [facility]   Equipment Recommendations       Recommendations for Other Services       Precautions / Restrictions Precautions Precautions: Fall Restrictions Weight Bearing Restrictions: No    Mobility  Bed Mobility Overal bed mobility: Needs Assistance Bed Mobility: Supine to Sit     Supine to sit: Max assist     General bed mobility comments: increased assist this session esp upper body and had to use bed pad to scoot hips to EOB  Transfers Overall transfer level: Needs assistance Equipment used: None Transfers: Stand Pivot Transfers   Stand pivot transfers: Mod assist;Max assist       General transfer comment: Used "bear hug" stand pivot tech due to increased c/o weakness and c/o inability to walk.  Assisted transfer from elevated bed to recliner 1/4 turn to pt's r.  Ambulation/Gait             General Gait Details: pt unable to attempt    Stairs            Wheelchair Mobility    Modified Rankin (Stroke Patients Only)       Balance                                    Cognition                            Exercises      General Comments        Pertinent Vitals/Pain C/o nausea    Home Living                      Prior Function             PT Goals (current goals can now be found in the care plan section) Progress towards PT goals: Progressing toward goals    Frequency  Min 3X/week    PT Plan      Co-evaluation             End of Session Equipment Utilized During Treatment: Gait belt Activity Tolerance: Patient limited by fatigue Patient left: in chair;with call bell/phone within reach     Time: 1210-1228 PT Time Calculation (min): 18 min  Charges:  $Therapeutic Activity: 8-22 mins                    G Codes:      Geoffrey Richardson  PTA [facility]  Acute  Rehab Pager      [phone]

## 2014-03-07 NOTE — Progress Notes (Addendum)
Progress Note from the Palliative Medicine Team at [facility]: Geoffrey Richardson is still feeling bloated and nauseated at times. I have started octreotide 50 mcg SQ bid (transition to IM at discharge) and decadron 4 mg IV bid to help with his symptoms of obstruction and nausea. He refuses NGT and refusing dulcolax supp at this time. Back pain is better but continues to have epigastric pain. He is lethargic this morning.    Objective: Allergies  Allergen Reactions  . Morphine And Related Nausea And Vomiting and Other (See Comments)    dizziness   Scheduled Meds: . bisacodyl  10 mg Rectal Once  . budesonide-formoterol  1 puff Inhalation Daily  . dexamethasone  4 mg Intravenous 3 times per day  . enoxaparin (LOVENOX) injection  40 mg Subcutaneous QHS  . feeding supplement (ENSURE COMPLETE)  237 mL Oral BID BM  . fentaNYL  25 mcg Transdermal Q72H  . hydrALAZINE  25 mg Oral 3 times per day  . lactose free nutrition  237 mL Oral BID  . lidocaine  2 patch Transdermal q morning - 10a  . magic mouthwash  10 mL Oral QID  . metoCLOPramide (REGLAN) injection  10 mg Intravenous 4 times per day  . multivitamin with minerals  1 tablet Oral Daily  . octreotide  50 mcg Subcutaneous 3 times per day  . pantoprazole  40 mg Oral Daily  . polyethylene glycol  17 g Oral Daily  . senna-docusate  1 tablet Oral Daily  . sucralfate  1 g Oral BID   Continuous Infusions: . sodium chloride 10 mL/hr at 03/06/14 2246   PRN Meds:.acetaminophen, albuterol, bisacodyl, chlorproMAZINE (THORAZINE) IV, chlorproMAZINE, gi cocktail, HYDROmorphone (DILAUDID) injection, LORazepam, ondansetron (ZOFRAN) IV, oxyCODONE, promethazine, ranitidine  BP 124/57  Pulse 85  Temp(Src) 97.8 F (36.6 C) (Oral)  Resp 20  Ht 6' (1.829 m)  Wt 87.8 kg (193 lb 9 oz)  BMI 26.25 kg/m2  SpO2 95%   PPS: 20%     Intake/Output Summary (Last 24 hours) at 03/07/14 1122 Last data filed at 03/07/14 0857  Gross per 24 hour  Intake    1200 ml  Output      0 ml  Net   1200 ml      LBM: 03/07/14      Physical Exam:  General: NAD, cachectic, ill appearing  HEENT: Severe temporal muscle wasting, no JVD, oral mucosa moist, thrush improved  Chest: No labored breathing, symmetric  CVS: Tachycardic  Abdomen: Distended, tender to palpation, +BS  Ext: MAE, 2-3+ BLE edema, warm to touch  Neuro: Awake, alert, oriented x 3, follows commands   Labs: CBC    Component Value Date/Time   WBC 9.6 03/07/2014 0450   WBC 6.1 12/13/2013 0803   RBC 5.36 03/07/2014 0450   RBC 4.53 12/13/2013 0803   HGB 13.0 03/07/2014 0450   HGB 11.3* 12/13/2013 0803   HCT 44.7 03/07/2014 0450   HCT 37.0* 12/13/2013 0803   PLT 145* 03/07/2014 0450   PLT 267 12/13/2013 0803   MCV 83.4 03/07/2014 0450   MCV 81.8 12/13/2013 0803   MCH 24.3* 03/07/2014 0450   MCH 24.9* 12/13/2013 0803   MCHC 29.1* 03/07/2014 0450   MCHC 30.5* 12/13/2013 0803   RDW 18.7* 03/07/2014 0450   RDW 18.5* 12/13/2013 0803   LYMPHSABS 0.9 02/22/2014 0922   LYMPHSABS 1.5 12/13/2013 0803   MONOABS 0.6 02/22/2014 0922   MONOABS 0.4 12/13/2013 0803   EOSABS 0.0 02/22/2014 6734  EOSABS 0.0 12/13/2013 0803   BASOSABS 0.0 02/22/2014 0922   BASOSABS 0.0 12/13/2013 0803    BMET    Component Value Date/Time   NA 142 03/07/2014 0450   NA 144 12/13/2013 0803   K 5.3 03/07/2014 0450   K 4.1 12/13/2013 0803   CL 102 03/07/2014 0450   CL 103 01/19/2013 1008   CO2 25 03/07/2014 0450   CO2 26 12/13/2013 0803   GLUCOSE 111* 03/07/2014 0450   GLUCOSE 109 12/13/2013 0803   GLUCOSE 129* 01/19/2013 1008   BUN 45* 03/07/2014 0450   BUN 16.1 12/13/2013 0803   CREATININE 1.35 03/07/2014 0450   CREATININE 0.8 12/13/2013 0803   CALCIUM 9.0 03/07/2014 0450   CALCIUM 9.5 12/13/2013 0803   GFRNONAA 59* 03/07/2014 0450   GFRAA 68* 03/07/2014 0450    CMP     Component Value Date/Time   NA 142 03/07/2014 0450   NA 144 12/13/2013 0803   K 5.3 03/07/2014 0450   K 4.1 12/13/2013 0803   CL 102 03/07/2014 0450   CL 103 01/19/2013 1008   CO2 25  03/07/2014 0450   CO2 26 12/13/2013 0803   GLUCOSE 111* 03/07/2014 0450   GLUCOSE 109 12/13/2013 0803   GLUCOSE 129* 01/19/2013 1008   BUN 45* 03/07/2014 0450   BUN 16.1 12/13/2013 0803   CREATININE 1.35 03/07/2014 0450   CREATININE 0.8 12/13/2013 0803   CALCIUM 9.0 03/07/2014 0450   CALCIUM 9.5 12/13/2013 0803   PROT 7.8 02/22/2014 0922   PROT 8.3 12/13/2013 0803   ALBUMIN 2.6* 02/22/2014 0922   ALBUMIN 3.1* 12/13/2013 0803   AST 17 02/22/2014 0922   AST 10 12/13/2013 0803   ALT 12 02/22/2014 0922   ALT <6 12/13/2013 0803   ALKPHOS 125* 02/22/2014 0922   ALKPHOS 94 12/13/2013 0803   BILITOT 0.4 02/22/2014 0922   BILITOT 0.21 12/13/2013 0803   GFRNONAA 59* 03/07/2014 0450   GFRAA 68* 03/07/2014 0450     Assessment and Plan:  1. Code Status: DNR 2. Symptom Control:  1. Pain: Fentanyl patch 25 mcg. Dilaudid 0.5 mg increased to every 2 hours prn. Oxycodone intensol 10 mg every 4 hours prn.  2. Anxiety: Ativan IV 0.5 every 6 hours prn. 3. Bowel Regimen: Senokot-S daily. Dulcolax supp daily prn - encouraged to utilize.  4. Nausea: Ondansetron prn. Promethazine prn. Consider Haldol if not relieved. 3. Obstruction: Octreotide 50 mcg SQ injection every 12 hours. Decadron IV 4 mg q8h.  4. Psycho/Social: Emotional support provided to patient. 5. Disposition: Home with hospice.     Time In Time Out Total Time Spent with Patient Total Overall Time  0830 0855 74min 4min    Greater than 50%  of this time was spent counseling and coordinating care related to the above assessment and plan.  Vinie Sill, NP Palliative Medicine Team Pager # [phone] (M-F 8a-5p) Team Phone # [phone] (Nights/Weekends)

## 2014-03-07 NOTE — Progress Notes (Signed)
Follow-up contacted by Marisue Ivan, stating pt's HCPOA/Friend [name] is awaiting call to follow up services of [facility]) after discharge.  Spoke with pt at bedside who was lethargic but, once awake was alert able to Artist of current DME at home and his choice to be at home with help of friends and [facility], stating he would like Dr [name] to follow with [facility]; pt requested writer speak directly with friend [name] about specifics. Writer contacted [name] @ [phone] to initiate education related to hospice services, philosophy and team approach to care; [name] voiced good understanding of information provided.  Per discussion and EPIC notes plan is to d/c by [facility] once arrangements are in place - [name] hopeful for tomorrow Wednesday, as she has Wednesday and Thursday off to try to get pt settled. *Please send completed GOLD DNR Form home with pt *Please note orders for peritoneal drain in Discharge instructions *currently pain and nausea medications are being adjusted by primary team and PMT for best regimen for home at d/c   DME needs discussed and [facility] equipment manager to order: Complete package D: fully electric hospital bed with AP&P mattress, half upper and lower rails, over bed table and add 3n1 BSC and tub seat with back; pt currently has Oxygen, walker and wheelchair in the home through [facility]  -pt remains on O2 @ 3LNC continuous *Contact to arrange delivery is [name] @ [phone]  Initial paperwork faxed to [facility]  Please notify [facility] when patient is ready to leave unit at d/c call [phone] (or [phone] if after 5 pm);  [facility] information and contact numbers also given to  during visit.   Above information shared with Judson Roch [facility] Please call with any questions or concerns   Danton Sewer, RN 03/07/2014, 11:47 AM [facility] Liaison [phone]

## 2014-03-08 DIAGNOSIS — M7989 Other specified soft tissue disorders: Secondary | ICD-10-CM

## 2014-03-08 DIAGNOSIS — I10 Essential (primary) hypertension: Secondary | ICD-10-CM

## 2014-03-08 LAB — BASIC METABOLIC PANEL
ANION GAP: 17 — AB (ref 5–15)
BUN: 67 mg/dL — ABNORMAL HIGH (ref 6–23)
CALCIUM: 8.6 mg/dL (ref 8.4–10.5)
CO2: 21 mEq/L (ref 19–32)
Chloride: 98 mEq/L (ref 96–112)
Creatinine, Ser: 1.86 mg/dL — ABNORMAL HIGH (ref 0.50–1.35)
GFR, EST AFRICAN AMERICAN: 46 mL/min — AB (ref >90)
GFR, EST NON AFRICAN AMERICAN: 40 mL/min — AB (ref >90)
Glucose, Bld: 125 mg/dL — ABNORMAL HIGH (ref 70–99)
POTASSIUM: 5.4 meq/L — AB (ref 3.7–5.3)
SODIUM: 136 meq/L — AB (ref 137–147)

## 2014-03-08 LAB — CBC
HCT: 43.5 % (ref 39.0–52.0)
HEMOGLOBIN: 12.6 g/dL — AB (ref 13.0–17.0)
MCH: 24.3 pg — AB (ref 26.0–34.0)
MCHC: 29 g/dL — ABNORMAL LOW (ref 30.0–36.0)
MCV: 84 fL (ref 78.0–100.0)
Platelets: 143 10*3/uL — ABNORMAL LOW (ref 150–400)
RBC: 5.18 MIL/uL (ref 4.22–5.81)
RDW: 18.8 % — AB (ref 11.5–15.5)
WBC: 10.6 10*3/uL — ABNORMAL HIGH (ref 4.0–10.5)

## 2014-03-08 MED ORDER — DEXAMETHASONE 1 MG/ML PO CONC: 4.0000 mg | Freq: Two times a day (BID) | ORAL | Status: AC

## 2014-03-08 MED ORDER — DEXAMETHASONE 1 MG/ML PO CONC
4.0000 mg | Freq: Two times a day (BID) | ORAL | Status: DC
  Administered 2014-03-08: 4 mg via ORAL
  Filled 2014-03-08 (×2): qty 4

## 2014-03-08 MED ORDER — HYDROMORPHONE HCL PF 1 MG/ML IJ SOLN
0.5000 mg | INTRAMUSCULAR | Status: DC | PRN
  Administered 2014-03-08: 0.5 mg via INTRAVENOUS
  Filled 2014-03-08: qty 1

## 2014-03-08 MED ORDER — LORAZEPAM 2 MG/ML PO CONC
0.5000 mg | Freq: Four times a day (QID) | ORAL | Status: DC | PRN
  Filled 2014-03-08: qty 1

## 2014-03-08 MED ORDER — OXYCODONE HCL ER 30 MG PO T12A: 60.0000 mg | EXTENDED_RELEASE_TABLET | Freq: Two times a day (BID) | ORAL | Status: AC

## 2014-03-08 MED ORDER — LORAZEPAM 2 MG/ML PO CONC: 0.5000 mg | Freq: Four times a day (QID) | ORAL | Status: AC | PRN

## 2014-03-08 MED ORDER — OCTREOTIDE ACETATE 20 MG IM KIT: 20.0000 mg | PACK | INTRAMUSCULAR | Status: AC

## 2014-03-08 MED ORDER — OXYCODONE HCL 20 MG/ML PO CONC: 10.0000 mg | ORAL | Status: AC | PRN

## 2014-03-08 MED ORDER — POLYETHYLENE GLYCOL 3350 17 G PO PACK
17.0000 g | PACK | Freq: Every day | ORAL | Status: AC
Start: 2014-03-08 — End: ?

## 2014-03-08 MED ORDER — CHLORPROMAZINE HCL 25 MG PO TABS: 25.0000 mg | ORAL_TABLET | Freq: Three times a day (TID) | ORAL | Status: AC | PRN

## 2014-03-08 MED ORDER — OXYCODONE HCL 20 MG/ML PO CONC
10.0000 mg | ORAL | Status: DC | PRN
  Administered 2014-03-08 (×3): 10 mg via ORAL
  Filled 2014-03-08 (×3): qty 1

## 2014-03-08 NOTE — Discharge Summary (Signed)
Physician Discharge Summary  Geoffrey Richardson IWL:[id number] DOB: 09-27-1960 DOA: 02/22/2014  PCP: Philis Fendt, MD  Admit date: 02/22/2014 Discharge date: 03/08/2014   Recommendations for Outpatient Follow-Up:   1. The patient is being discharged home with hospice care. 2. His peritoneal drain should be drained every other day routinely and as needed for recurrent abdominal distention or pain. 3. A one-month supply of his pain medication has been prescribed. He will need refills in one month.   Discharge Diagnosis:   Principal Problem:    Cancer associated pain secondary to rapidly recurring malignant ascites status post peritoneal drain placement Active Problems:    COPD (chronic obstructive pulmonary disease)    Severe malnutrition    Malignant pleural effusion    Lung cancer    Malignant ascites    Abdominal pain    Chronic respiratory failure with hypoxia    Palliative care encounter    Metastatic cancer    Carcinomatosis peritonei    Essential hypertension, benign    Dyspnea   Discharge Condition: Terminal.  Diet recommendation: Low sodium, heart healthy.     History of Present Illness:   Geoffrey Richardson is an 53 y.o. male a PMH of non-small cell lung cancer with recurrent malignant pleural effusion status post pleural catheter insertion 01/15/2013 (subsequently removed after became infected) and chemotherapy with carboplatin and Taxotere on 02/17/13 status post 6 cycles which completed on 06/02/13, recent hospital admission 01/25/14-02/03/14 for evaluation of abdominal pain and early satiety. CT scan of the chest, abdomen and pelvis done during that admission showed peritoneal caking and ascites. He had a 1.4 L paracentesis done 01/26/14. Plan was to followup with oncology as an outpatient for consideration of further chemotherapy if his performance status/nutrition improved, however he has failed to thrive and was readmitted 02/22/14 with uncontrolled cancer  related pain from malignant ascites and shortness of breath from a malignant pleural effusion. Palliative care has been consulted, however the patient continues to want aggressive care despite his deteriorating status and poor prognosis.   Hospital Course by Problem:   Principal Problem:  Cancer associated pain secondary to malignant ascites and peritoneal implants  Palliative care consulted to recommend a more effective outpatient pain management strategy.  Status post placement of a peritoneal catheter for recurrent rapidly accumulating malignant ascites. We recommend drainage every other day routinely with when necessary drainage for increased abdominal distention/pain. Patient now a DO NOT RESUSCITATE status with plans to discharge home with hospice today.  Pain management prescribed at discharge: OxyContin 60 mg every 12 hours, Roxanol 10 mg by mouth every 2 hours when necessary breakthrough pain.  Active Problems:  Acute renal failure  Renal function deteriorating, likely related to dehydration and possible extrinsic compression of the ureters given his disease burden. Given overall prognosis, would avoid further lab checks.  Functional quadriplegia  Continues to be weak. He is being discharged home with hospice.  Oral thrush  Treated with Diflucan and Magic mouthwash. Resolved at discharge.  Benign essential hypertension  Continue hydralazine and labetalol.  COPD (chronic obstructive pulmonary disease)/chronic respiratory failure with hypoxia / dyspnea  Continue Symbicort. Continue bronchodilators as needed.   Severe malnutrition  Pt met criteria for severe MALNUTRITION in the context of chronic illness as evidenced by PO intake < 75% for > one month, 6.8% body weight loss in one month.  Secondary to cancer related cachexia. Boost nutritional supplements ordered.  Dietitian following.  Malignant pleural effusion  Effusion is loculated and could not be drained  per  interventional radiology.  Metastatic Lung cancer  Seen by Dr. [name] 02/22/14. Poor prognosis with no recommendations for chemotherapy. Being discharged home with hospice care.    Medical Consultants:    Dr. Zola Button, Oncology.   Dr. Billey Chang, Palliative Care  Discharge Exam:   Filed Vitals:   03/08/14 0601  BP: 111/96  Pulse: 125  Temp: 97.6 F (36.4 C)  Resp: 20   Filed Vitals:   03/07/14 2140 03/08/14 0500 03/08/14 0601 03/08/14 0641  BP: 114/88  111/96   Pulse: 129  125   Temp: 97.4 F (36.3 C)  97.6 F (36.4 C)   TempSrc: Oral  Axillary   Resp: 20  20   Height:      Weight:  85.7 kg (188 lb 15 oz)    SpO2: 98%  94% 94%    Gen:  NAD, cachectic Cardiovascular:  Tachycardic, No M/R/G Respiratory: Lungs diminished in the bases Gastrointestinal: Abdomen distended with peritoneal catheter in place, positive bowel sounds. Extremities: Asymmetric edema, worse on the left, 3+ pitting   The results of significant diagnostics from this hospitalization (including imaging, microbiology, ancillary and laboratory) are listed below for reference.     Procedures and Diagnostic Studies:    Acute abdominal series 02/22/14: Stable chronic findings in the right hemithorax. No acute cardiopulmonary process. Mild progression of small bowel distention with ascites and peritoneal nodularity on CT concerning for underlying peritoneal carcinomatosis.   Ultrasound guided paracentesis 02/22/14: Successful ultrasound-guided paracentesis yielding 4.1 L of ascites.   Ultrasound of the chest 02/22/14: Chronic multiloculated pleural effusions. Patient had prior Pleurx catheter to the right side. This was removed due to loculation of the effusion and no output from the catheter. The patient may have a trapped right lung and thoracentesis could result in a pneumothorax and therefore was not done.   Chest x-ray 02/26/14: No acute findings. Moderate to large right pleural effusion.  Stable appearance from the prior chest radiograph. COPD.   Ultrasound-guided paracentesis 02/28/14: Successful ultrasound-guided paracentesis yielding 2.5 L of ascites.   Insertion of tunneled peritoneal drainage catheter 03/01/14:2.1 L of ascites removed after catheter placement.  Labs:   Basic Metabolic Panel:  Recent Labs Lab 03/05/14 0500 03/06/14 0416 03/07/14 0450 03/08/14 0408  NA 144 143 142 136*  K 4.4 4.9 5.3 5.4*  CL 105 102 102 98  CO2 24 25 25 21   GLUCOSE 94 101* 111* 125*  BUN 25* 31* 45* 67*  CREATININE 0.75 0.93 1.35 1.86*  CALCIUM 8.7 8.8 9.0 8.6   GFR Estimated Creatinine Clearance: 51 ml/min (by C-G formula based on Cr of 1.86).  CBC:  Recent Labs Lab 03/05/14 0500 03/06/14 0416 03/07/14 0450 03/08/14 0408  WBC 10.2 8.8 9.6 10.6*  HGB 12.1* 13.0 13.0 12.6*  HCT 42.5 46.3 44.7 43.5  MCV 84.2 85.6 83.4 84.0  PLT 157 153 145* 143*     Discharge Instructions:       Discharge Instructions   Call MD for:  persistant nausea and vomiting    Complete by:  As directed      Call MD for:  severe uncontrolled pain    Complete by:  As directed      Call MD for:  temperature >100.4    Complete by:  As directed      Diet general    Complete by:  As directed      Discharge instructions    Complete by:  As directed   Drain  peritoneal catheter every other day routinely, and as needed for increased abdominal distention/pain.  You were cared for by Dr. Jacquelynn Cree  (a hospitalist) during your hospital stay. If you have any questions about your discharge medications or the care you received while you were in the hospital after you are discharged, you can call the unit and ask to speak with the hospitalist on call if the hospitalist that took care of you is not available. Once you are discharged, your primary care physician will handle any further medical issues. Please note that NO REFILLS for any discharge medications will be authorized once you are  discharged, as it is imperative that you return to your primary care physician (or establish a relationship with a primary care physician if you do not have one) for your aftercare needs so that they can reassess your need for medications and monitor your lab values.  Any outstanding tests can be reviewed by your PCP at your follow up visit.  It is also important to review any medicine changes with your PCP.  Please bring these d/c instructions with you to your next visit so your physician can review these changes with you.     Increase activity slowly    Complete by:  As directed             Medication List    STOP taking these medications       HYDROcodone-acetaminophen 5-325 MG per tablet  Commonly known as:  NORCO/VICODIN     HYDROmorphone 2 MG tablet  Commonly known as:  DILAUDID      TAKE these medications       albuterol 108 (90 BASE) MCG/ACT inhaler  Commonly known as:  PROVENTIL HFA;VENTOLIN HFA  Inhale 2 puffs into the lungs every 4 (four) hours as needed for wheezing or shortness of breath.     budesonide-formoterol 160-4.5 MCG/ACT inhaler  Commonly known as:  SYMBICORT  Inhale 1 puff into the lungs daily.     chlorproMAZINE 25 MG tablet  Commonly known as:  THORAZINE  Take 1 tablet (25 mg total) by mouth 3 (three) times daily as needed for hiccoughs.     dexamethasone 1 MG/ML solution  Commonly known as:  DECADRON  Take 4 mLs (4 mg total) by mouth every 12 (twelve) hours.     docusate sodium 100 MG capsule  Commonly known as:  COLACE  Take 100 mg by mouth daily.     famotidine 20 MG tablet  Commonly known as:  PEPCID  Take 1 tablet (20 mg total) by mouth 2 (two) times daily.     GAS-X MAXIMUM STRENGTH PO  Take 1-2 tablets by mouth 4 (four) times daily as needed (stomach pains).     hydrALAZINE 25 MG tablet  Commonly known as:  APRESOLINE  Take 1 tablet (25 mg total) by mouth every 8 (eight) hours.     labetalol 100 MG tablet  Commonly known as:   NORMODYNE  Take 1 tablet (100 mg total) by mouth 2 (two) times daily.     lactose free nutrition Liqd  Take 237 mLs by mouth 3 (three) times daily.     LORazepam 2 MG/ML concentrated solution  Commonly known as:  ATIVAN  Take 0.3 mLs (0.6 mg total) by mouth every 6 (six) hours as needed for anxiety.     metoCLOPramide 10 MG tablet  Commonly known as:  REGLAN  Take 1 tablet (10 mg total) by mouth 4 (four) times daily -  before meals and at bedtime.     multivitamin with minerals Tabs tablet  Take 1 tablet by mouth daily.     octreotide 20 MG injection  Commonly known as:  SANDOSTATIN LAR  Inject 20 mg into the muscle every 28 (twenty-eight) days.     ondansetron 4 MG disintegrating tablet  Commonly known as:  ZOFRAN ODT  Take 1-2 tablets (4-8 mg total) by mouth every 8 (eight) hours as needed for nausea or vomiting.     OVER THE COUNTER MEDICATION  at bedtime. Activia Yogurt     oxyCODONE 20 MG/ML concentrated solution  Commonly known as:  ROXICODONE INTENSOL  Take 0.5 mLs (10 mg total) by mouth every 2 (two) hours as needed for moderate pain.     OxyCODONE HCl ER 30 MG T12a  Take 60 mg by mouth every 12 (twelve) hours.     pantoprazole 40 MG tablet  Commonly known as:  PROTONIX  Take 1 tablet (40 mg total) by mouth daily.     polyethylene glycol packet  Commonly known as:  MIRALAX / GLYCOLAX  Take 17 g by mouth daily.     sennosides-docusate sodium 8.6-50 MG tablet  Commonly known as:  SENOKOT-S  Take 1 tablet by mouth daily with breakfast.     TYLENOL ARTHRITIS PAIN 650 MG CR tablet  Generic drug:  acetaminophen  Take 325-650 mg by mouth every 8 (eight) hours as needed for pain.       Follow-up Information   Schedule an appointment as soon as possible for a visit with Philis Fendt, MD. (As needed)    Specialty:  Internal Medicine   Contact information:   [place] [phone]       Time coordinating discharge: 35  minutes  Signed:  Darden Flemister  Pager [phone] [facility] 03/08/2014, 10:21 AM

## 2014-03-08 NOTE — Progress Notes (Addendum)
Progress Note from the Palliative Medicine Team at [facility]: Geoffrey Richardson is lethargic this morning but arousable. Still complaining of epigastric pain and occasional nausea (improved with no further vomiting). Plan is for home with hospice today and Geoffrey Richardson is at home awaiting delivery of hospital bed. I will adjust pain medications to more frequently and he is able to tolerate the liquid oxycodone according to nursing. I also recommend prescription for octreotide 20 mg IM x 1 for hospice to give when home. I understand plan is for home with hospice today.    Objective: Allergies  Allergen Reactions  . Morphine And Related Nausea And Vomiting and Other (See Comments)    dizziness   Scheduled Meds: . bisacodyl  10 mg Rectal Once  . budesonide-formoterol  1 puff Inhalation Daily  . dexamethasone  4 mg Oral Q12H  . enoxaparin (LOVENOX) injection  40 mg Subcutaneous QHS  . feeding supplement (ENSURE COMPLETE)  237 mL Oral BID BM  . fentaNYL  25 mcg Transdermal Q72H  . hydrALAZINE  25 mg Oral 3 times per day  . lactose free nutrition  237 mL Oral BID  . lidocaine  2 patch Transdermal q morning - 10a  . magic mouthwash  10 mL Oral QID  . metoCLOPramide (REGLAN) injection  10 mg Intravenous 4 times per day  . multivitamin with minerals  1 tablet Oral Daily  . octreotide  50 mcg Subcutaneous 3 times per day  . pantoprazole  40 mg Oral Daily  . polyethylene glycol  17 g Oral Daily  . senna-docusate  1 tablet Oral Daily  . sucralfate  1 g Oral BID   Continuous Infusions: . sodium chloride 10 mL/hr at 03/06/14 2246   PRN Meds:.acetaminophen, albuterol, bisacodyl, chlorproMAZINE (THORAZINE) IV, chlorproMAZINE, gi cocktail, HYDROmorphone (DILAUDID) injection, LORazepam, ondansetron (ZOFRAN) IV, oxyCODONE, promethazine, ranitidine  BP 111/96  Pulse 125  Temp(Src) 97.6 F (36.4 C) (Axillary)  Resp 20  Ht 6' (1.829 m)  Wt 85.7 kg (188 lb 15 oz)  BMI 25.62 kg/m2  SpO2 94%    PPS: 20%     Intake/Output Summary (Last 24 hours) at 03/08/14 0927 Last data filed at 03/08/14 0551  Gross per 24 hour  Intake  478.5 ml  Output      0 ml  Net  478.5 ml      LBM: 03/07/14      Physical Exam:  General: NAD, cachectic, ill appearing  HEENT: Severe temporal muscle wasting, no JVD, oral mucosa moist, thrush improved  Chest: No labored breathing, symmetric  CVS: Tachycardic  Abdomen: Distended, tender to palpation Ext: MAE, 2-3+ BLE edema, warm to touch  Neuro: Awake, alert, oriented x 3, follows commands   Labs: CBC    Component Value Date/Time   WBC 10.6* 03/08/2014 0408   WBC 6.1 12/13/2013 0803   RBC 5.18 03/08/2014 0408   RBC 4.53 12/13/2013 0803   HGB 12.6* 03/08/2014 0408   HGB 11.3* 12/13/2013 0803   HCT 43.5 03/08/2014 0408   HCT 37.0* 12/13/2013 0803   PLT 143* 03/08/2014 0408   PLT 267 12/13/2013 0803   MCV 84.0 03/08/2014 0408   MCV 81.8 12/13/2013 0803   MCH 24.3* 03/08/2014 0408   MCH 24.9* 12/13/2013 0803   MCHC 29.0* 03/08/2014 0408   MCHC 30.5* 12/13/2013 0803   RDW 18.8* 03/08/2014 0408   RDW 18.5* 12/13/2013 0803   LYMPHSABS 0.9 02/22/2014 0922   LYMPHSABS 1.5 12/13/2013 0803   MONOABS 0.6  02/22/2014 0922   MONOABS 0.4 12/13/2013 0803   EOSABS 0.0 02/22/2014 0922   EOSABS 0.0 12/13/2013 0803   BASOSABS 0.0 02/22/2014 0922   BASOSABS 0.0 12/13/2013 0803    BMET    Component Value Date/Time   NA 136* 03/08/2014 0408   NA 144 12/13/2013 0803   K 5.4* 03/08/2014 0408   K 4.1 12/13/2013 0803   CL 98 03/08/2014 0408   CL 103 01/19/2013 1008   CO2 21 03/08/2014 0408   CO2 26 12/13/2013 0803   GLUCOSE 125* 03/08/2014 0408   GLUCOSE 109 12/13/2013 0803   GLUCOSE 129* 01/19/2013 1008   BUN 67* 03/08/2014 0408   BUN 16.1 12/13/2013 0803   CREATININE 1.86* 03/08/2014 0408   CREATININE 0.8 12/13/2013 0803   CALCIUM 8.6 03/08/2014 0408   CALCIUM 9.5 12/13/2013 0803   GFRNONAA 40* 03/08/2014 0408   GFRAA 46* 03/08/2014 0408    CMP     Component Value Date/Time   NA 136* 03/08/2014  0408   NA 144 12/13/2013 0803   K 5.4* 03/08/2014 0408   K 4.1 12/13/2013 0803   CL 98 03/08/2014 0408   CL 103 01/19/2013 1008   CO2 21 03/08/2014 0408   CO2 26 12/13/2013 0803   GLUCOSE 125* 03/08/2014 0408   GLUCOSE 109 12/13/2013 0803   GLUCOSE 129* 01/19/2013 1008   BUN 67* 03/08/2014 0408   BUN 16.1 12/13/2013 0803   CREATININE 1.86* 03/08/2014 0408   CREATININE 0.8 12/13/2013 0803   CALCIUM 8.6 03/08/2014 0408   CALCIUM 9.5 12/13/2013 0803   PROT 7.8 02/22/2014 0922   PROT 8.3 12/13/2013 0803   ALBUMIN 2.6* 02/22/2014 0922   ALBUMIN 3.1* 12/13/2013 0803   AST 17 02/22/2014 0922   AST 10 12/13/2013 0803   ALT 12 02/22/2014 0922   ALT <6 12/13/2013 0803   ALKPHOS 125* 02/22/2014 0922   ALKPHOS 94 12/13/2013 0803   BILITOT 0.4 02/22/2014 0922   BILITOT 0.21 12/13/2013 0803   GFRNONAA 40* 03/08/2014 0408   GFRAA 46* 03/08/2014 0408     Assessment and Plan:  1. Code Status: DNR 2. Symptom Control:  1. Pain: Fentanyl patch 25 mcg. Dilaudid 0.5 mg every 3 hours prn. Oxycodone intensol increased to 10 mg every 3 hours as needed. Hospice to continue to titrate medication to comfort in home setting.  2. Anxiety: Ativan 0.5 mg po every 6 hours prn. 3. Bowel Regimen: Senokot-S daily. Dulcolax supp daily prn - encouraged Geoffrey Richardson to utilize at home.  4. Nausea: Ondansetron prn. Promethazine prn.  3. Obstruction: Octreotide 50 mcg SQ injection every 12 hours to be d/c upon discharge with octreotide 20 mg IM x 1 prescription given. Decadron 4 mg solution q12h.  4. Psycho/Social: Emotional support provided to patient. 5. Disposition: Home with hospice.     Time In Time Out Total Time Spent with Patient Total Overall Time  0840 0915 32min 45min    Greater than 50%  of this time was spent counseling and coordinating care related to the above assessment and plan.  Vinie Sill, NP Palliative Medicine Team Pager # [phone] (M-F 8a-5p) Team Phone # [phone] (Nights/Weekends)

## 2014-03-08 NOTE — Progress Notes (Signed)
CSW received notification that pt requiring ambulance transport home where pt will be following by [facility].   CSW confirmed address with pt and pt significant other at bedside.   CSW arranged ambulance transport for pt to home via [facility].  No further social work needs identified at this time.  CSW signing off.   [name], MSW, [place] Work [phone]

## 2014-03-08 NOTE — Discharge Instructions (Signed)
Ascites Drainage Catheter Home Guide °There are several types of catheter systems that may be used for ascites drainage. Follow these general guidelines as well as specific instructions from your caregiver. Your caregiver will teach you how to use your system, as well as provide specific drainage kit instructions from the manufacturer.  °DRAINING FROM THE CATHETER °· Clean all surfaces. °· Wash your hands thoroughly with warm water and soap. °· Wear a mask to cover your nose and mouth. °· Make sure all the supplies are within reach on a clean, germ-free (sterile) surface. °· Attach the adapter or access tip to the safety valve on your catheter. You may need to remove a protective cap on the safety valve. °· Release the clamps on your drainage container. °· Allow fluid to drain into the container. °· Close the clamps and release the catheter from the drainage container. °· You may be asked to record the amount of fluid or bring it to your caregiver's office. °· Your caregiver will let you know when and how much fluid to drain. °AFTER DRAINING °· Wipe the end of your catheter as directed with an antiseptic solution, such as rubbing alcohol. °· Put the protective cap back on the safety valve. °· Place gauze bandages (dressings) around the catheter site as directed. °· Secure the catheter to your body with gauze and dressings as directed. °OTHER TIPS °· Ask your caregiver about periodic flushing of the catheter. °· Ask your caregiver about specific dietary instructions. °· Follow up with your caregiver as directed. °SEEK MEDICAL CARE IF: °· You have trouble draining or caring for your catheter. °· You have chills. °· You notice bleeding, skin irritation, drainage, redness, or pain around the insertion site. °· You have abdominal pain, bloating, pressure, or cramping. °· The fluid draining out of the catheter is cloudy or has a bad smell. °· You have other new symptoms. °· You have questions or concerns. °SEEK IMMEDIATE  MEDICAL CARE IF: °· Your abdominal pain does not go away or becomes severe. °· You have a fever. °MAKE SURE YOU: °· Understand these instructions. °· Will watch your condition. °· Will get help right away if you are not doing well or get worse. °Document Released: 07/17/2011 Document Revised: 10/20/2011 Document Reviewed: 07/17/2011 °Exit[employer]® Patient Information ©2015 [employer]. This information is not intended to replace advice given to you by your health care provider. Make sure you discuss any questions you have with your health care provider. ° °

## 2014-03-08 NOTE — Progress Notes (Signed)
*  PRELIMINARY RESULTS* Vascular Ultrasound Left lower extremity venous duplex has been completed.  Preliminary findings: Technically limited due to edema. Could not image left common femoral and proximal femoral veins due to patient refusal to remove shorts. Left femoral vein mid to distal was poorly visualized and compressions could not be performed due to patient pain intolerance. Possibly DVT in mid left femoral vein, but cannot determine definitively. Left popliteal, posterior tibial, and peroneal veins appear negative for DVT.   Landry Mellow, RDMS, RVT  03/08/2014, 1:20 PM

## 2014-03-08 NOTE — Progress Notes (Signed)
Pt discharged home with hospice via [facility].Discharge instructions and scripts given to [facility] (caregiver) .

## 2014-03-08 NOTE — Progress Notes (Signed)
Drained 600 ml of clear amber fluid from pleurex. Pt tolerated well. Will continue to monitor

## 2014-03-09 ENCOUNTER — Telehealth: Payer: Self-pay | Admitting: Medical Oncology

## 2014-03-09 NOTE — Telephone Encounter (Signed)
Abigail Butts, Patient Care Director with [facility], states patient is currently on Sandostatin and in hospice. Per Hospice physician Dr. Lyman Speller, if patient is to continue with Sandostatin his hospice status will have to be revoked.   Reviewed with Dr. [name] and informed Abigail Butts that Dr [name] states the Sandostatin can be stopped. Abigail Butts gave verbal understanding, knows to call office with further questions or concerns.

## 2014-03-11 DEATH — deceased

## 2014-03-17 ENCOUNTER — Ambulatory Visit: Payer: Self-pay | Admitting: Oncology

## 2014-03-17 ENCOUNTER — Other Ambulatory Visit: Payer: Self-pay

## 2014-04-05 ENCOUNTER — Other Ambulatory Visit: Payer: Self-pay | Admitting: Pharmacist

## 2014-05-23 IMAGING — CR DG CHEST 1V PORT
2 series · 2 of 2 positions shown · non-contrast
Comparison: 01/12/2013

CLINICAL DATA: Status post right thoracotomy for malignant pleural
effusion

PORTABLE CHEST - 1 VIEW

[AP (1 of 2)]
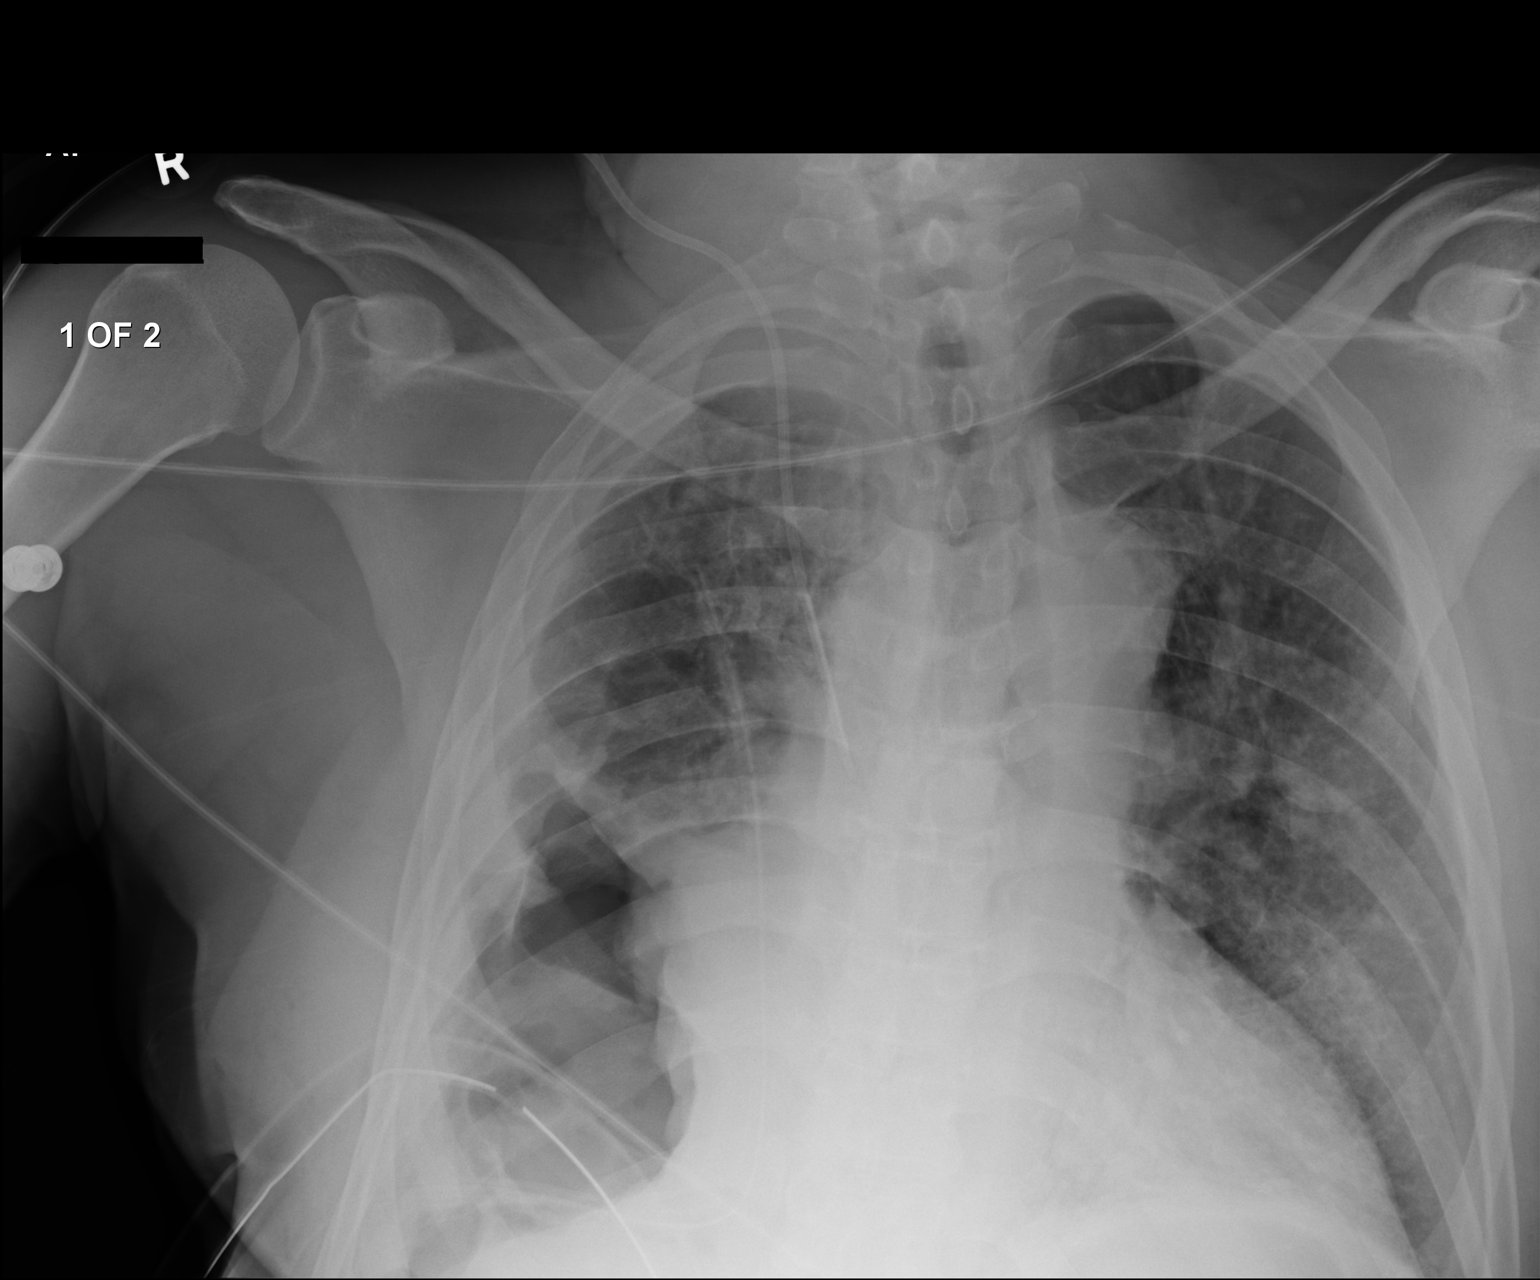

[AP (2 of 2)]
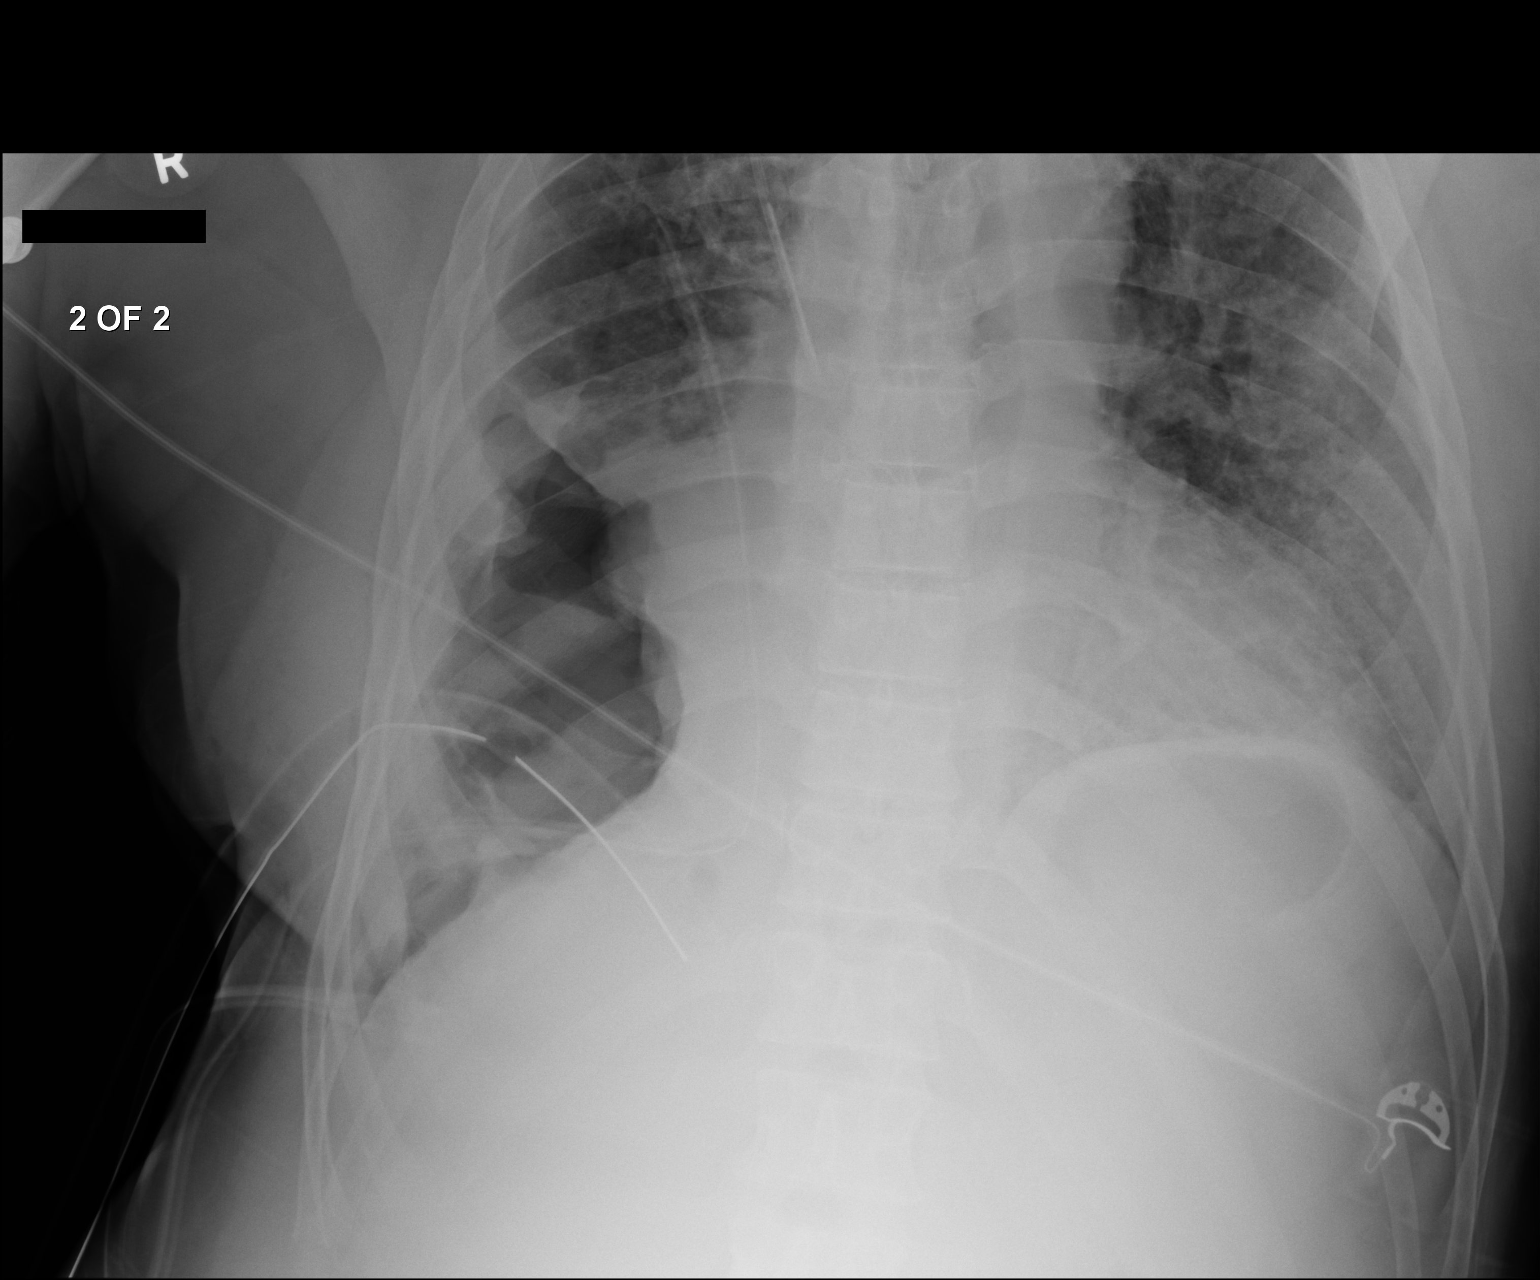

[2 of 2 positions shown; findings below may reference images not displayed]

FINDINGS: Postoperatively, two drainage tubes are noted in the
right pleural space with a component of basilar pneumothorax
present from lack of complete re-expansion of the lung.  The
component of the fluid has been removed and there is underlying
atelectasis and consolidation of the right lower lung.  A central
line is in place with the tip in the SVC.  No pulmonary edema is
present.  Heart is mildly enlarged.
IMPRESSION: 1. Lack of full re-expansion of the right lung with component of
the right lower basilar pneumothorax present.
2.  Central line tip in SVC.

## 2014-05-25 IMAGING — CR DG CHEST 1V PORT
2 series · 2 of 2 positions shown · non-contrast
Comparison: Chest x-ray 01/15/2013.

CLINICAL DATA: Evaluate chest tube placement.

PORTABLE CHEST - 1 VIEW

[AP (1 of 2)]
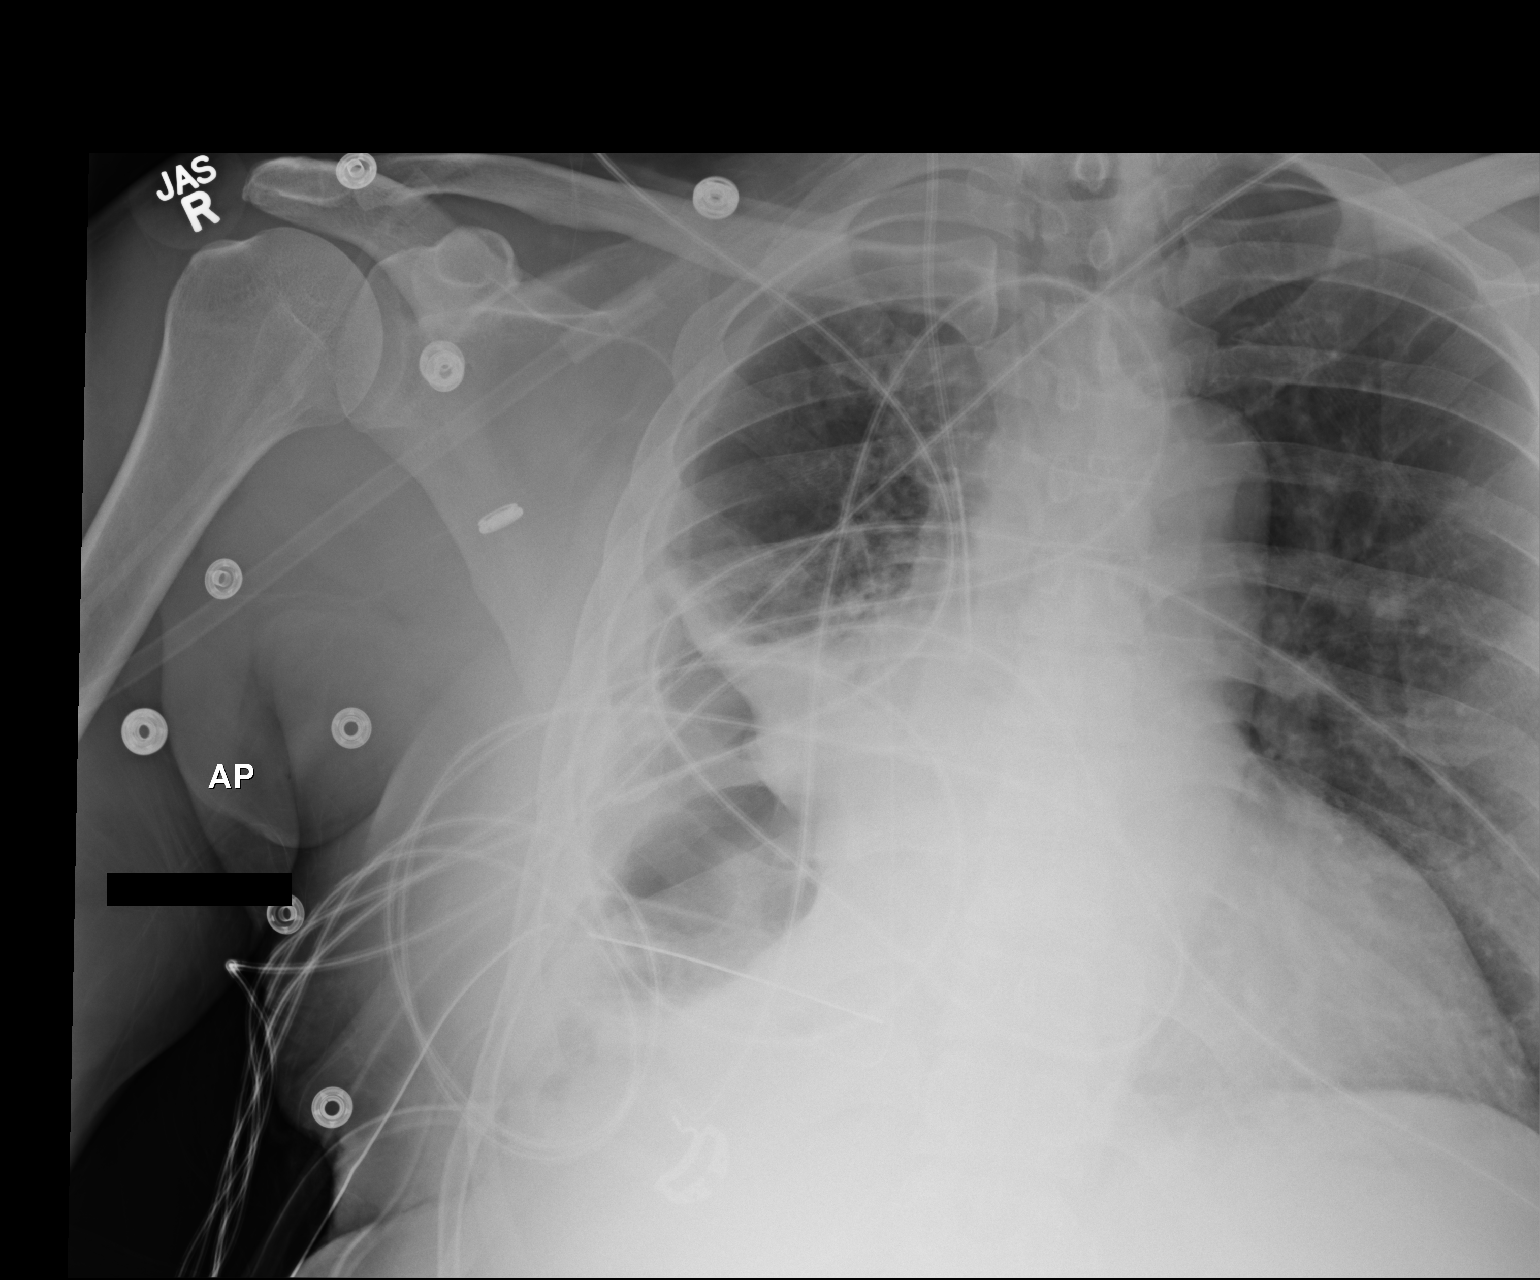

[AP (2 of 2)]
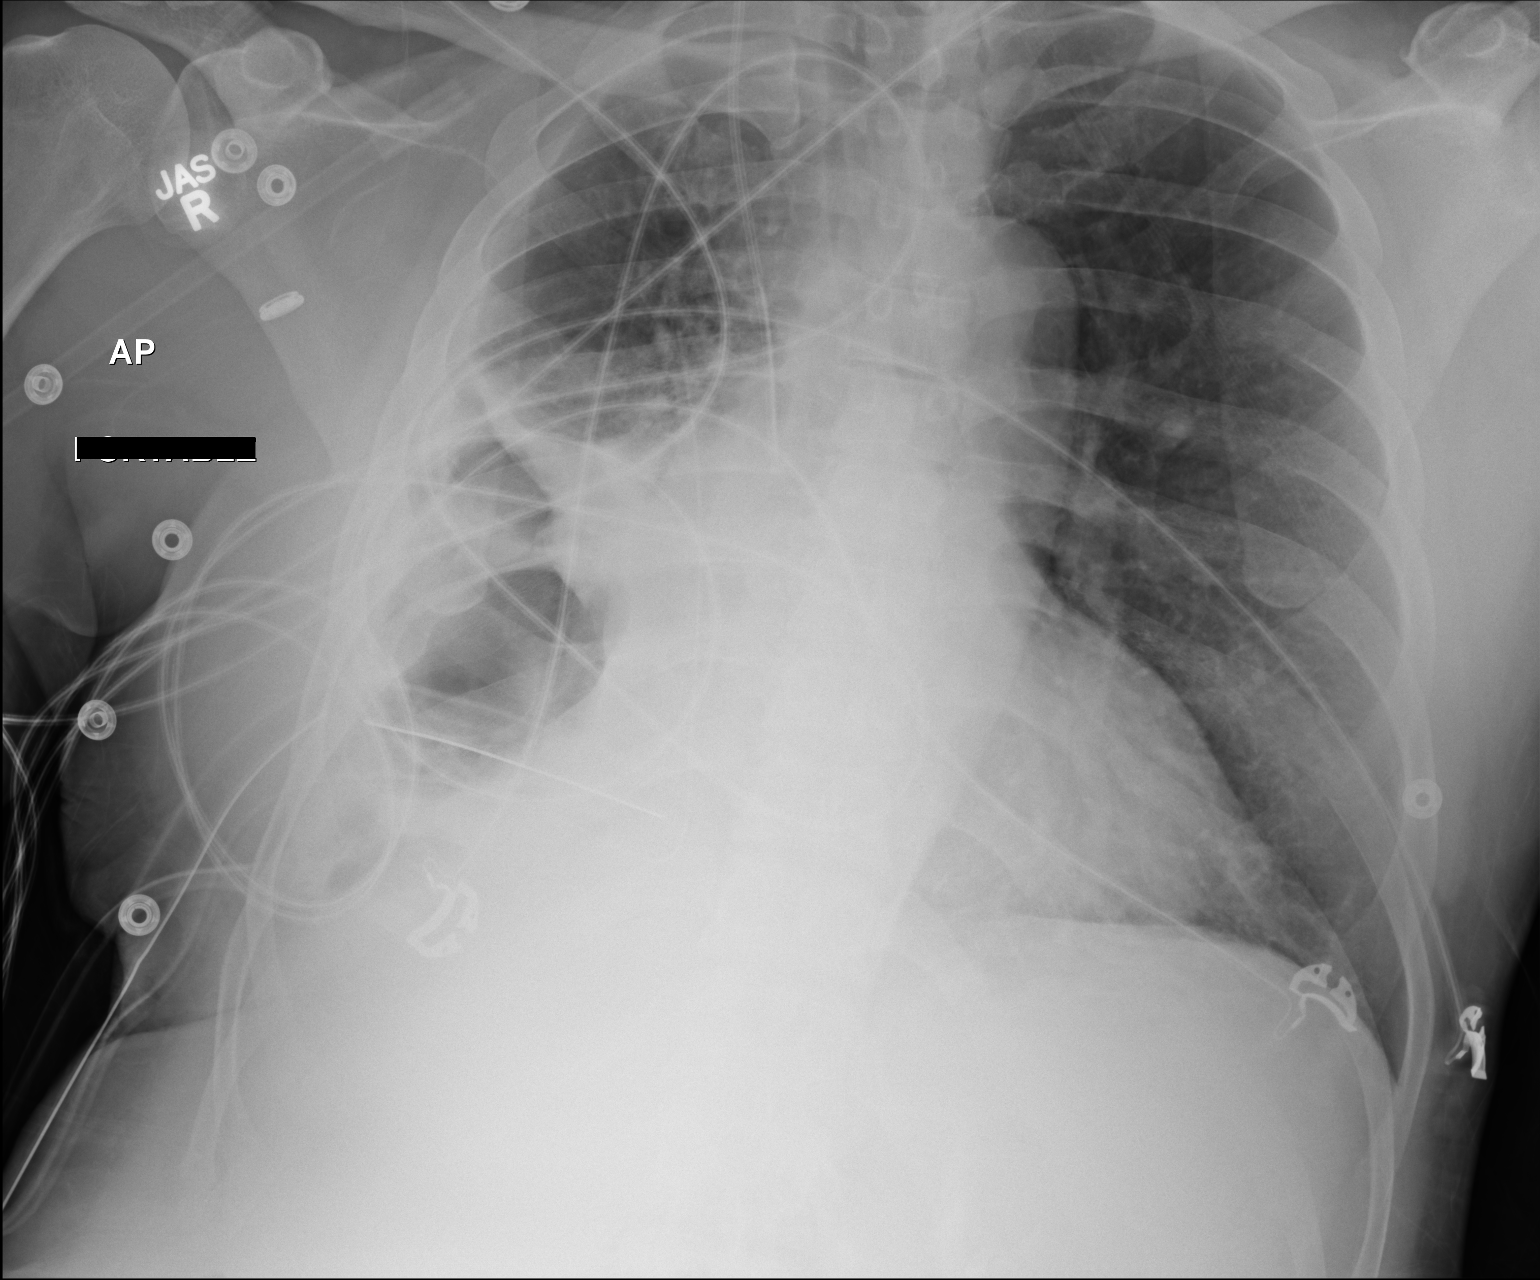

[2 of 2 positions shown; findings below may reference images not displayed]

FINDINGS: Right-sided chest tube remains in position in the lower
right hemithorax with tip medially and side port near the border of
the bony thorax. There is a smaller bore right-sided chest tube is
well extending vertically in the medial aspect of the right
hemithorax, unchanged in position.  Again noted is apparent
pneumothorax ex vacuo related to the evacuation of the patients
previously noted chronic right-sided pleural effusion and failure
to fully re-expand to be chronically collapsed right lower lobe.
There may be a small amount of residual right-sided pleural fluid.
Extensive architectural distortion throughout the right mid and
lower lung is similar to prior study.  Left lung is clear.  Heart
size is within normal limits.  Mediastinal contours are distorted
by patient positioning. There is a right-sided internal jugular
central venous catheter with tip terminating in the mid to distal
superior vena cava..
IMPRESSION: 1.  Allowing for slight differences in patient positioning, the
radiographic appearance of the chest is essentially unchanged, as
above.

## 2014-05-26 IMAGING — CR DG CHEST 2V
2 series · 2 of 2 positions shown · non-contrast
Comparison: One-view chest 01/16/2013

CLINICAL DATA: Right pleural effusion.

CHEST - 2 VIEW

[w chest pa]
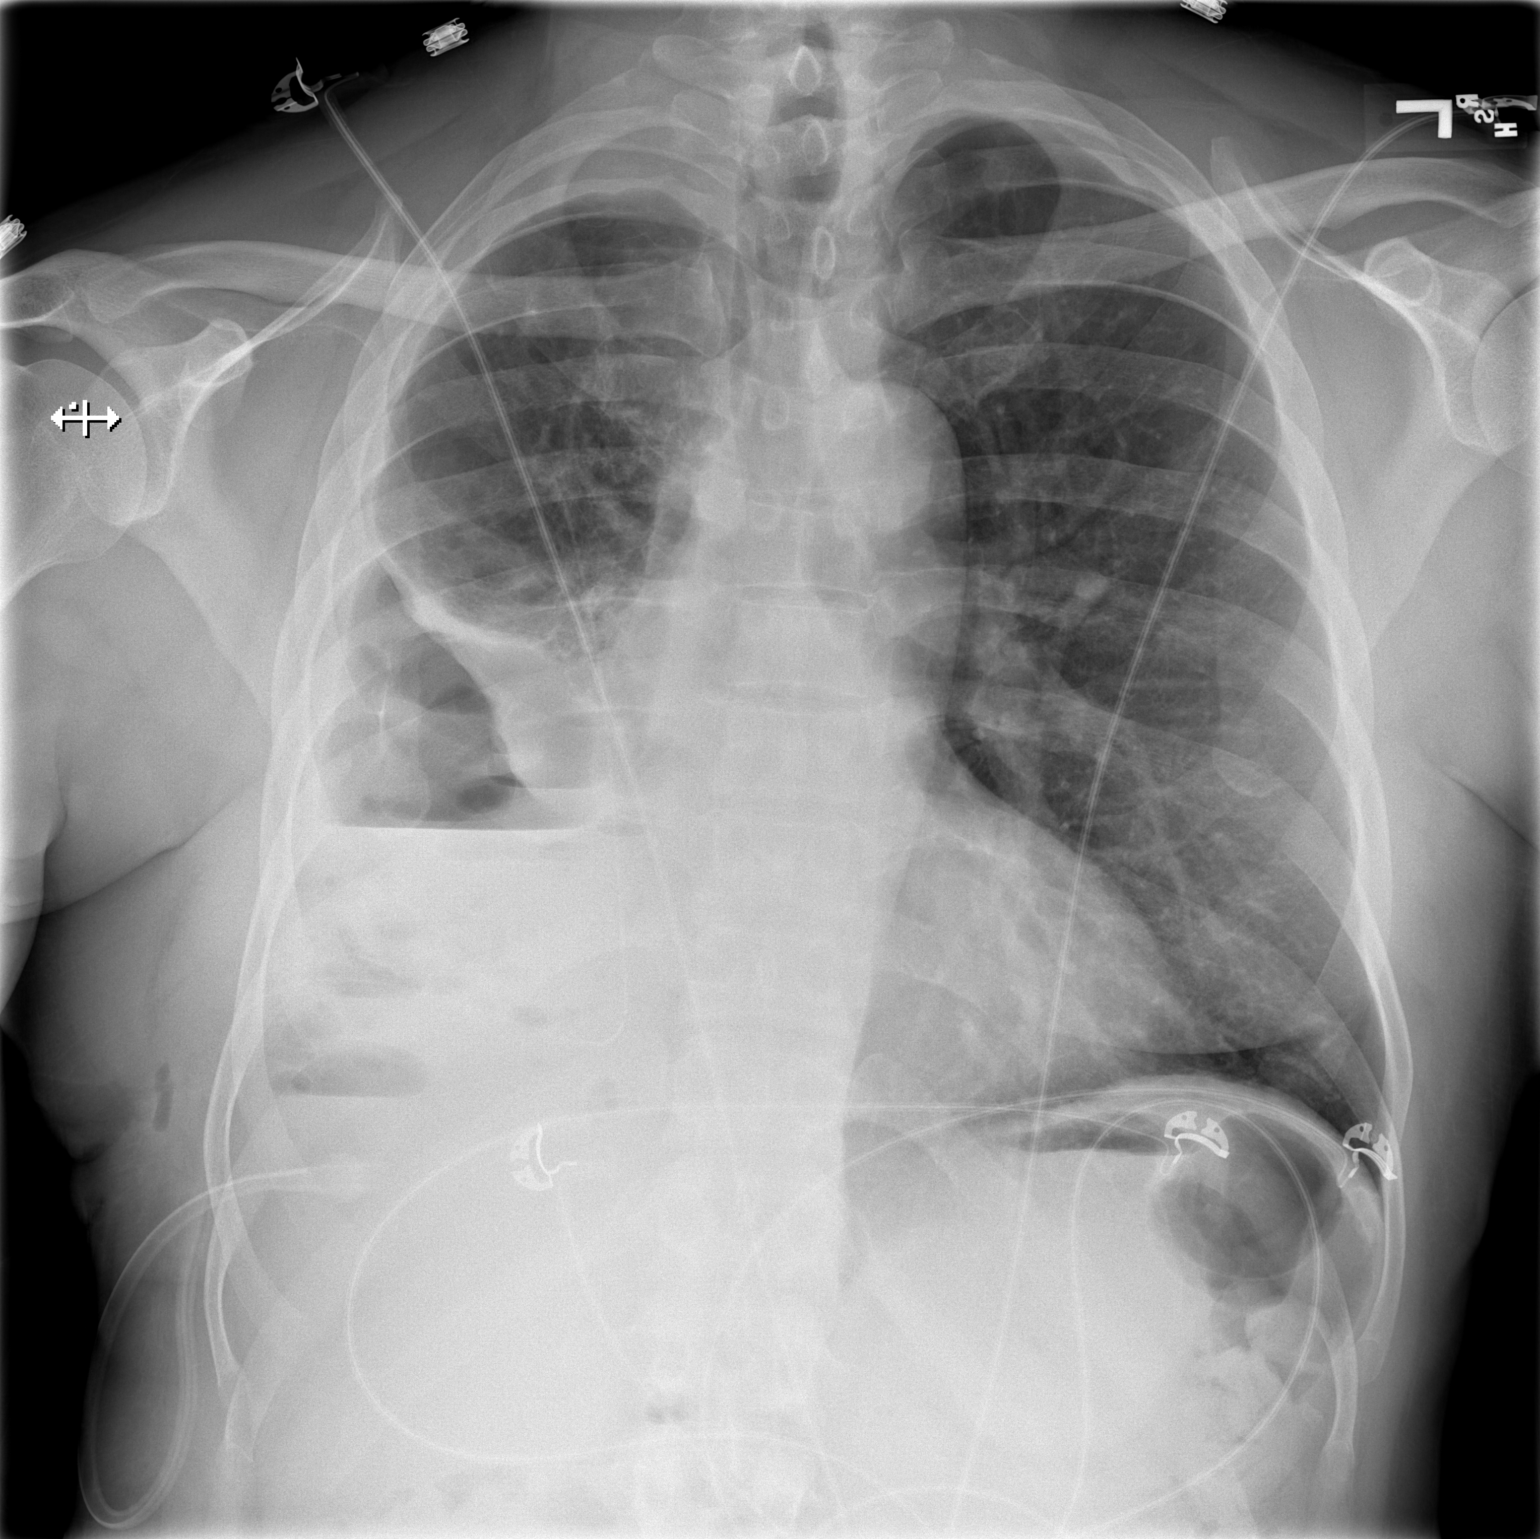

[w chest lat]
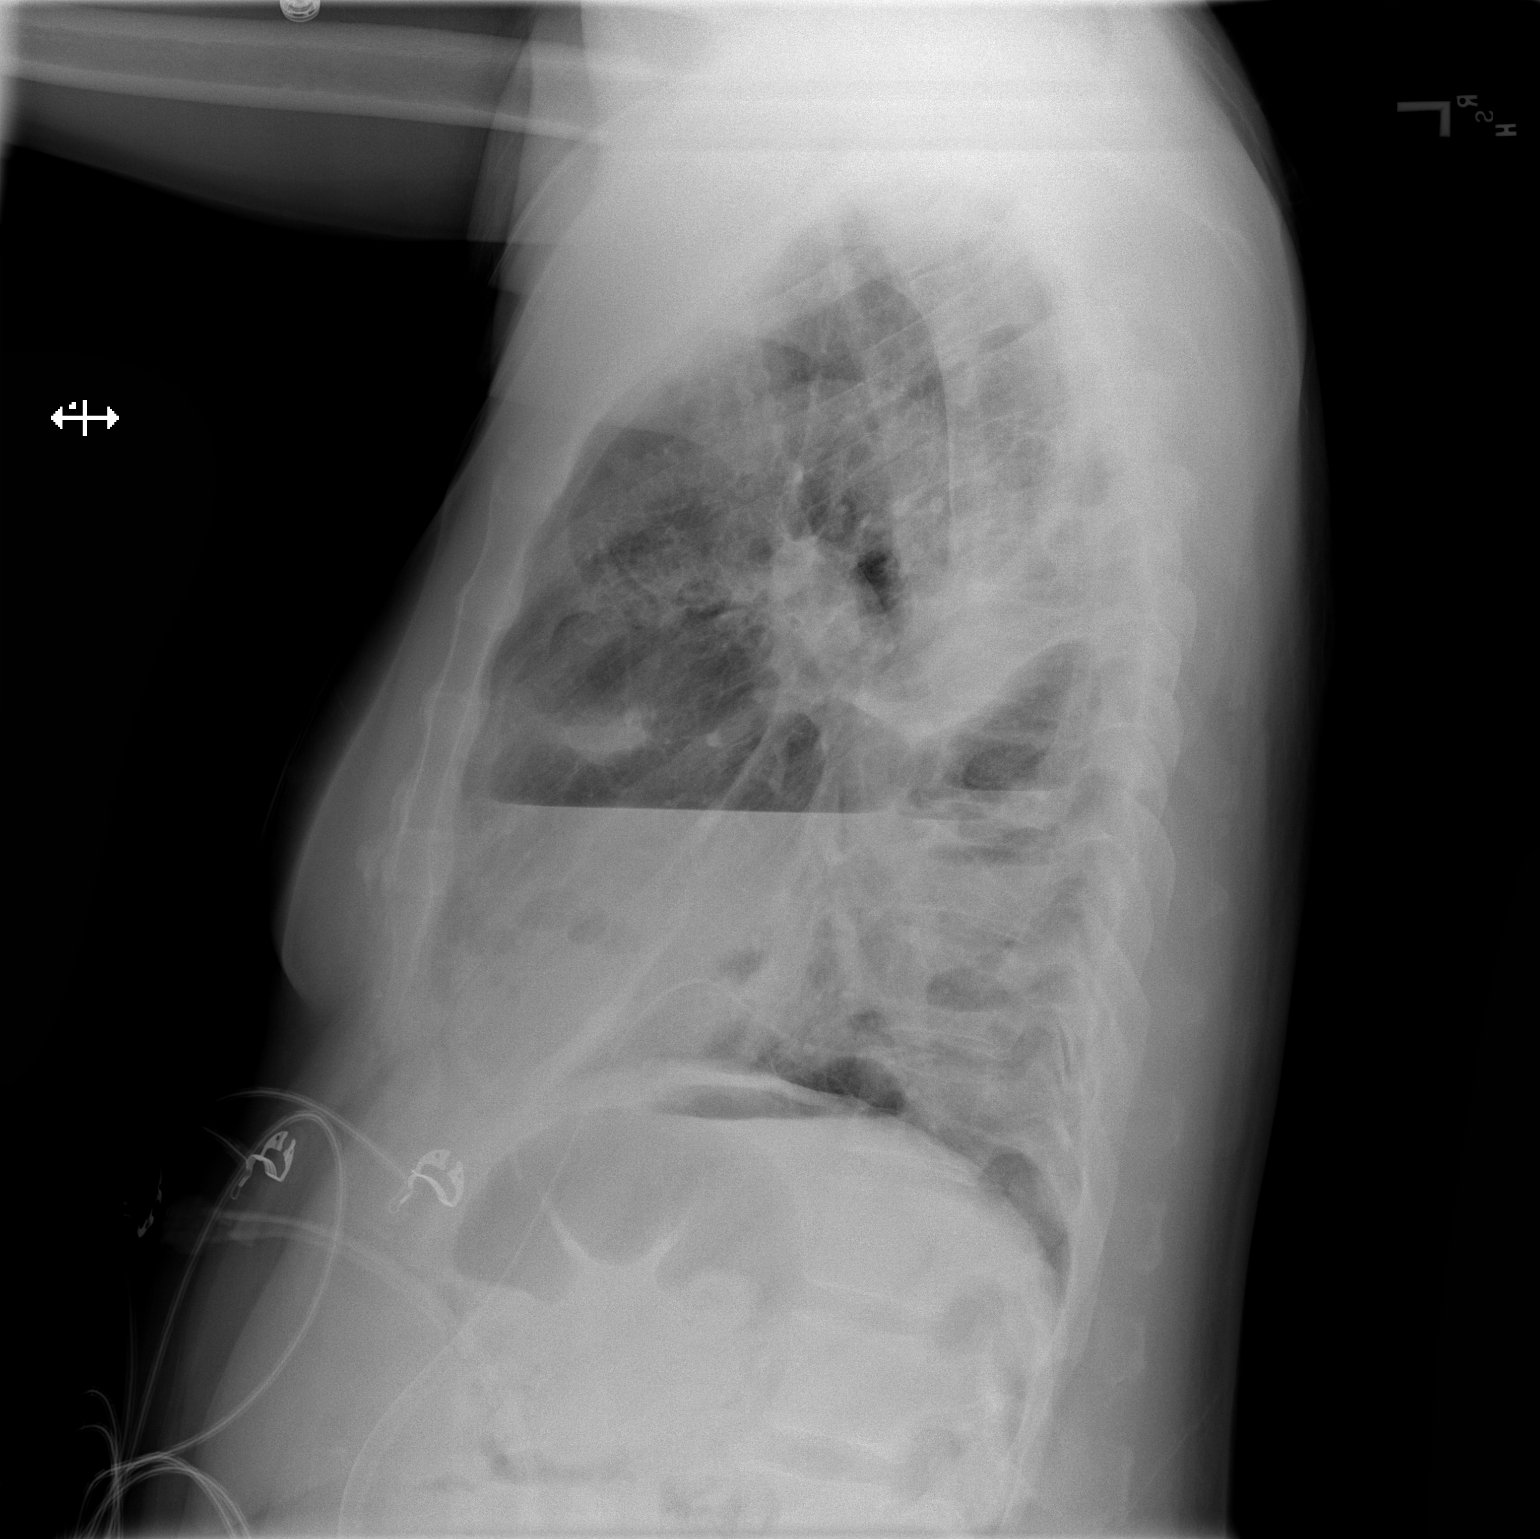

[2 of 2 positions shown; findings below may reference images not displayed]

FINDINGS: The heart size is within normal limits.  A large right
hydropneumothorax remains.  Associated airspace disease is present.
Minimal left basilar atelectasis is present.  There is no
significant left effusion.
IMPRESSION: 1.  Right hydropneumothorax with multiple air-fluid levels.  This
raises concern for loculated collections.
2.  Minimal left basilar atelectasis.
